# Patient Record
Sex: Male | Born: 1960 | Race: White | Hispanic: No | Smoking: Former smoker
Health system: Southern US, Academic
[De-identification: ages and names within clinical notes are randomized; demographics above are authoritative.]

## PROBLEM LIST (undated history)

## (undated) DIAGNOSIS — K219 Gastro-esophageal reflux disease without esophagitis: Secondary | ICD-10-CM

## (undated) DIAGNOSIS — F419 Anxiety disorder, unspecified: Secondary | ICD-10-CM

## (undated) DIAGNOSIS — E119 Type 2 diabetes mellitus without complications: Secondary | ICD-10-CM

## (undated) DIAGNOSIS — F329 Major depressive disorder, single episode, unspecified: Secondary | ICD-10-CM

## (undated) DIAGNOSIS — F32A Depression, unspecified: Secondary | ICD-10-CM

## (undated) DIAGNOSIS — M869 Osteomyelitis, unspecified: Secondary | ICD-10-CM

## (undated) DIAGNOSIS — G629 Polyneuropathy, unspecified: Secondary | ICD-10-CM

## (undated) HISTORY — PX: MANDIBLE SURGERY: SHX707

## (undated) HISTORY — PX: SHOULDER SURGERY: SHX246

---

## 1995-12-31 ENCOUNTER — Other Ambulatory Visit (HOSPITAL_COMMUNITY): Payer: Self-pay

## 2019-08-06 DIAGNOSIS — Z22322 Carrier or suspected carrier of Methicillin resistant Staphylococcus aureus: Secondary | ICD-10-CM

## 2019-08-06 HISTORY — DX: Carrier or suspected carrier of methicillin resistant Staphylococcus aureus: Z22.322

## 2019-08-11 ENCOUNTER — Inpatient Hospital Stay
Admission: EM | Admit: 2019-08-11 | Discharge: 2019-08-17 | DRG: 854 | Disposition: A | Discharge status: Home / Self Care | Payer: Medicare PPO | Attending: Internal Medicine | Admitting: Internal Medicine

## 2019-08-11 ENCOUNTER — Inpatient Hospital Stay (HOSPITAL_COMMUNITY): Payer: Medicare PPO | Admitting: Internal Medicine

## 2019-08-11 ENCOUNTER — Other Ambulatory Visit: Payer: Self-pay

## 2019-08-11 ENCOUNTER — Encounter (HOSPITAL_COMMUNITY): Payer: Self-pay | Admitting: Internal Medicine

## 2019-08-11 ENCOUNTER — Inpatient Hospital Stay (EMERGENCY_DEPARTMENT_HOSPITAL): Payer: Medicare PPO

## 2019-08-11 DIAGNOSIS — Z87891 Personal history of nicotine dependence: Secondary | ICD-10-CM

## 2019-08-11 DIAGNOSIS — L97429 Non-pressure chronic ulcer of left heel and midfoot with unspecified severity: Secondary | ICD-10-CM | POA: Diagnosis present

## 2019-08-11 DIAGNOSIS — E113293 Type 2 diabetes mellitus with mild nonproliferative diabetic retinopathy without macular edema, bilateral: Secondary | ICD-10-CM

## 2019-08-11 DIAGNOSIS — M86672 Other chronic osteomyelitis, left ankle and foot: Secondary | ICD-10-CM | POA: Diagnosis present

## 2019-08-11 DIAGNOSIS — H05012 Cellulitis of left orbit: Secondary | ICD-10-CM | POA: Diagnosis present

## 2019-08-11 DIAGNOSIS — N179 Acute kidney failure, unspecified: Secondary | ICD-10-CM | POA: Diagnosis present

## 2019-08-11 DIAGNOSIS — H40009 Preglaucoma, unspecified, unspecified eye: Secondary | ICD-10-CM

## 2019-08-11 DIAGNOSIS — L219 Seborrheic dermatitis, unspecified: Secondary | ICD-10-CM | POA: Diagnosis present

## 2019-08-11 DIAGNOSIS — L03213 Periorbital cellulitis: Secondary | ICD-10-CM | POA: Diagnosis present

## 2019-08-11 DIAGNOSIS — H2513 Age-related nuclear cataract, bilateral: Secondary | ICD-10-CM

## 2019-08-11 DIAGNOSIS — E46 Unspecified protein-calorie malnutrition: Secondary | ICD-10-CM | POA: Diagnosis present

## 2019-08-11 DIAGNOSIS — E1169 Type 2 diabetes mellitus with other specified complication: Secondary | ICD-10-CM | POA: Diagnosis present

## 2019-08-11 DIAGNOSIS — E11621 Type 2 diabetes mellitus with foot ulcer: Secondary | ICD-10-CM | POA: Diagnosis present

## 2019-08-11 DIAGNOSIS — E1142 Type 2 diabetes mellitus with diabetic polyneuropathy: Secondary | ICD-10-CM | POA: Diagnosis present

## 2019-08-11 DIAGNOSIS — T368X5A Adverse effect of other systemic antibiotics, initial encounter: Secondary | ICD-10-CM | POA: Diagnosis present

## 2019-08-11 DIAGNOSIS — J9811 Atelectasis: Secondary | ICD-10-CM

## 2019-08-11 DIAGNOSIS — R918 Other nonspecific abnormal finding of lung field: Secondary | ICD-10-CM

## 2019-08-11 DIAGNOSIS — Z01810 Encounter for preprocedural cardiovascular examination: Secondary | ICD-10-CM

## 2019-08-11 DIAGNOSIS — H40003 Preglaucoma, unspecified, bilateral: Secondary | ICD-10-CM | POA: Diagnosis present

## 2019-08-11 DIAGNOSIS — A4102 Sepsis due to Methicillin resistant Staphylococcus aureus: Principal | ICD-10-CM | POA: Diagnosis present

## 2019-08-11 DIAGNOSIS — R911 Solitary pulmonary nodule: Secondary | ICD-10-CM | POA: Diagnosis present

## 2019-08-11 DIAGNOSIS — Z79899 Other long term (current) drug therapy: Secondary | ICD-10-CM

## 2019-08-11 DIAGNOSIS — R9431 Abnormal electrocardiogram [ECG] [EKG]: Secondary | ICD-10-CM

## 2019-08-11 DIAGNOSIS — K5900 Constipation, unspecified: Secondary | ICD-10-CM | POA: Diagnosis not present

## 2019-08-11 DIAGNOSIS — M8580 Other specified disorders of bone density and structure, unspecified site: Secondary | ICD-10-CM | POA: Diagnosis present

## 2019-08-11 DIAGNOSIS — J330 Polyp of nasal cavity: Secondary | ICD-10-CM

## 2019-08-11 DIAGNOSIS — I330 Acute and subacute infective endocarditis: Secondary | ICD-10-CM

## 2019-08-11 DIAGNOSIS — D638 Anemia in other chronic diseases classified elsewhere: Secondary | ICD-10-CM | POA: Diagnosis present

## 2019-08-11 DIAGNOSIS — Z794 Long term (current) use of insulin: Secondary | ICD-10-CM

## 2019-08-11 DIAGNOSIS — D3141 Benign neoplasm of right ciliary body: Secondary | ICD-10-CM

## 2019-08-11 DIAGNOSIS — E1140 Type 2 diabetes mellitus with diabetic neuropathy, unspecified: Secondary | ICD-10-CM

## 2019-08-11 HISTORY — DX: Type 2 diabetes mellitus without complications (CMS HCC): E11.9

## 2019-08-11 LAB — CBC WITH DIFF
BASOPHIL #: 0.11 10*3/uL (ref <=0.20)
BASOPHIL %: 1 %
EOSINOPHIL #: 0.22 10*3/uL (ref <=0.50)
EOSINOPHIL %: 1 %
HCT: 37 % — ABNORMAL LOW (ref 38.9–52.0)
HGB: 12.5 g/dL — ABNORMAL LOW (ref 13.4–17.5)
IMMATURE GRANULOCYTE #: 0.11 10*3/uL — ABNORMAL HIGH (ref <0.10)
IMMATURE GRANULOCYTE %: 1 % (ref 0–1)
LYMPHOCYTE #: 1.4 10*3/uL (ref 1.00–4.80)
LYMPHOCYTE %: 8 %
MCH: 30 pg (ref 26.0–32.0)
MCHC: 33.8 g/dL (ref 31.0–35.5)
MCV: 88.7 fL (ref 78.0–100.0)
MONOCYTE #: 1.21 10*3/uL — ABNORMAL HIGH (ref 0.20–1.10)
MONOCYTE %: 7 %
MPV: 10.5 fL (ref 8.7–12.5)
NEUTROPHIL #: 13.92 10*3/uL — ABNORMAL HIGH (ref 1.50–7.70)
NEUTROPHIL %: 82 %
PLATELETS: 256 10*3/uL (ref 150–400)
RBC: 4.17 10*6/uL — ABNORMAL LOW (ref 4.50–6.10)
RDW-CV: 12.8 % (ref 11.5–15.5)
WBC: 17 10*3/uL — ABNORMAL HIGH (ref 3.7–11.0)

## 2019-08-11 LAB — HEPATIC FUNCTION PANEL
ALBUMIN: 2.5 g/dL — ABNORMAL LOW (ref 3.5–5.0)
ALKALINE PHOSPHATASE: 120 U/L — ABNORMAL HIGH (ref 45–115)
ALT (SGPT): 6 U/L (ref <55)
AST (SGOT): 16 U/L (ref 8–48)
BILIRUBIN DIRECT: 0.3 mg/dL — ABNORMAL HIGH (ref <0.3)
BILIRUBIN TOTAL: 0.7 mg/dL (ref 0.3–1.3)
PROTEIN TOTAL: 7.5 g/dL (ref 6.4–8.3)

## 2019-08-11 LAB — URINALYSIS, MACROSCOPIC
BILIRUBIN: NEGATIVE mg/dL
COLOR: NORMAL
GLUCOSE: NEGATIVE mg/dL
KETONES: NEGATIVE mg/dL
LEUKOCYTES: NEGATIVE WBCs/uL
NITRITE: NEGATIVE
PH: 5 (ref 5.0–8.0)
PROTEIN: >500 mg/dL — AB
SPECIFIC GRAVITY: 1.014 (ref 1.005–1.030)
UROBILINOGEN: NEGATIVE mg/dL

## 2019-08-11 LAB — VANCOMYCIN, RANDOM: VANCOMYCIN RANDOM: 7.3 ug/mL

## 2019-08-11 LAB — HIV1/HIV2 SCREEN, COMBINED ANTIGEN AND ANTIBODY: HIV SCREEN, COMBINED ANTIGEN & ANTIBODY: NEGATIVE

## 2019-08-11 LAB — STREPTOCOCCUS PNEUMONIAE ANTIGEN,URINE: S.PNEUMONIA ANTIGEN: NEGATIVE

## 2019-08-11 LAB — URINALYSIS, MICROSCOPIC
RBCS: 4 /HPF (ref <6.0)
WBCS: 1 /HPF (ref <4.0)

## 2019-08-11 LAB — HEPATITIS C ANTIBODY SCREEN WITH REFLEX TO HCV PCR: HCV ANTIBODY QUALITATIVE: NEGATIVE

## 2019-08-11 LAB — BASIC METABOLIC PANEL
ANION GAP: 13 mmol/L (ref 4–13)
BUN/CREA RATIO: 16 (ref 6–22)
BUN: 23 mg/dL (ref 8–25)
CALCIUM: 9.3 mg/dL (ref 8.5–10.2)
CHLORIDE: 105 mmol/L (ref 96–111)
CO2 TOTAL: 20 mmol/L — ABNORMAL LOW (ref 22–32)
CREATININE: 1.43 mg/dL — ABNORMAL HIGH (ref 0.62–1.27)
ESTIMATED GFR: 53 mL/min/{1.73_m2} — ABNORMAL LOW (ref >60)
GLUCOSE: 78 mg/dL (ref 65–139)
POTASSIUM: 3.8 mmol/L (ref 3.5–5.1)
SODIUM: 138 mmol/L (ref 136–145)

## 2019-08-11 LAB — VENOUS BLOOD GAS/LACTATE
%FIO2 (VENOUS): 21 %
BASE DEFICIT: 0.2 mmol/L (ref ?–3.0)
BICARBONATE (VENOUS): 23.8 mmol/L (ref 22.0–26.0)
LACTATE: 1.1 mmol/L (ref 0.0–1.3)
PCO2 (VENOUS): 41 mmHg (ref 41.00–51.00)
PH (VENOUS): 7.39 (ref 7.31–7.41)
PO2 (VENOUS): 32 mmHg — ABNORMAL LOW (ref 35.0–50.0)

## 2019-08-11 LAB — LEGIONELLA URINE ANTIGEN: LEGIONELLA ANTIGEN: NEGATIVE

## 2019-08-11 LAB — CREATININE URINE, RANDOM: CREATININE RANDOM URINE: 82 mg/dL

## 2019-08-11 LAB — SODIUM, RANDOM URINE: SODIUM RANDOM URINE: 65 mmol/L

## 2019-08-11 LAB — C-REACTIVE PROTEIN(CRP),INFLAMMATION: CRP INFLAMMATION: 210.2 mg/L — ABNORMAL HIGH (ref <=8.0)

## 2019-08-11 LAB — MAGNESIUM: MAGNESIUM: 1.8 mg/dL (ref 1.6–2.6)

## 2019-08-11 LAB — POC BLOOD GLUCOSE (RESULTS): GLUCOSE, POC: 217 mg/dL — ABNORMAL HIGH (ref 70–105)

## 2019-08-11 MED ORDER — HEPARIN (PORCINE) 5,000 UNIT/ML INJECTION SOLUTION
5000.0000 [IU] | Freq: Three times a day (TID) | INTRAMUSCULAR | Status: DC
Start: 2019-08-11 — End: 2019-08-17
  Administered 2019-08-11 – 2019-08-17 (×18): 5000 [IU] via SUBCUTANEOUS
  Filled 2019-08-11 (×18): qty 1

## 2019-08-11 MED ORDER — SODIUM CHLORIDE 0.9 % INTRAVENOUS PIGGYBACK
2.00 g | INJECTION | Freq: Two times a day (BID) | INTRAVENOUS | Status: DC
Start: 2019-08-12 — End: 2019-08-17
  Administered 2019-08-12 (×2): 0 g via INTRAVENOUS
  Administered 2019-08-12 (×2): 2 g via INTRAVENOUS
  Administered 2019-08-13: 0 g via INTRAVENOUS
  Administered 2019-08-13 – 2019-08-14 (×2): 2 g via INTRAVENOUS
  Administered 2019-08-14 (×2): 0 g via INTRAVENOUS
  Administered 2019-08-14 – 2019-08-15 (×3): 2 g via INTRAVENOUS
  Administered 2019-08-15 – 2019-08-16 (×3): 0 g via INTRAVENOUS
  Administered 2019-08-16 (×2): 2 g via INTRAVENOUS
  Administered 2019-08-16: 03:00:00 0 g via INTRAVENOUS
  Administered 2019-08-17: 01:00:00 2 g via INTRAVENOUS
  Administered 2019-08-17: 13:00:00
  Administered 2019-08-17: 04:00:00 0 g via INTRAVENOUS
  Filled 2019-08-11 (×5): qty 12.5
  Filled 2019-08-11: qty 100
  Filled 2019-08-11 (×13): qty 12.5

## 2019-08-11 MED ORDER — MORPHINE 4 MG/ML INTRAVENOUS SOLUTION
4.0000 mg | INTRAVENOUS | Status: AC
Start: 2019-08-11 — End: 2019-08-11
  Administered 2019-08-11: 4 mg via INTRAVENOUS

## 2019-08-11 MED ORDER — METRONIDAZOLE 500 MG/100 ML IN SODIUM CHLOR(ISO) INTRAVENOUS PIGGYBACK
500.0000 mg | INJECTION | Freq: Three times a day (TID) | INTRAVENOUS | Status: DC
Start: 2019-08-11 — End: 2019-08-12
  Administered 2019-08-11: 500 mg via INTRAVENOUS
  Administered 2019-08-11 – 2019-08-12 (×2): 0 mg via INTRAVENOUS
  Administered 2019-08-12: 500 mg via INTRAVENOUS
  Filled 2019-08-11 (×3): qty 100

## 2019-08-11 MED ORDER — GABAPENTIN 400 MG CAPSULE
400.00 mg | ORAL_CAPSULE | Freq: Three times a day (TID) | ORAL | Status: DC
Start: 2019-08-12 — End: 2019-08-17
  Administered 2019-08-12 – 2019-08-17 (×16): 400 mg via ORAL
  Filled 2019-08-11 (×16): qty 1

## 2019-08-11 MED ORDER — VANCOMYCIN 10 GRAM INTRAVENOUS SOLUTION
20.00 mg/kg | INTRAVENOUS | Status: DC
Start: 2019-08-11 — End: 2019-08-13
  Administered 2019-08-11: 1750 mg via INTRAVENOUS
  Administered 2019-08-12 (×2): 0 mg via INTRAVENOUS
  Administered 2019-08-12 – 2019-08-13 (×3): 1750 mg via INTRAVENOUS
  Administered 2019-08-13: 07:00:00 via INTRAVENOUS
  Administered 2019-08-13: 0 mg via INTRAVENOUS
  Filled 2019-08-11 (×3): qty 17.5

## 2019-08-11 MED ORDER — ACETAMINOPHEN 325 MG TABLET
975.0000 mg | ORAL_TABLET | ORAL | Status: AC
Start: 2019-08-11 — End: 2019-08-11
  Administered 2019-08-11: 975 mg via ORAL
  Filled 2019-08-11: qty 3

## 2019-08-11 MED ORDER — SODIUM CHLORIDE 0.9 % INTRAVENOUS PIGGYBACK
2.00 g | INJECTION | INTRAVENOUS | Status: AC
Start: 2019-08-11 — End: 2019-08-11
  Administered 2019-08-11: 2 g via INTRAVENOUS
  Administered 2019-08-11: 0 g via INTRAVENOUS
  Filled 2019-08-11 (×3): qty 12.5

## 2019-08-11 MED ORDER — INSULIN GLARGINE (U-100) 100 UNIT/ML SUBCUTANEOUS SOLUTION
5.0000 [IU] | Freq: Every evening | SUBCUTANEOUS | Status: DC
Start: 2019-08-11 — End: 2019-08-11

## 2019-08-11 MED ORDER — SODIUM CHLORIDE 0.9 % (FLUSH) INJECTION SYRINGE
2.0000 mL | INJECTION | Freq: Three times a day (TID) | INTRAMUSCULAR | Status: DC
Start: 2019-08-11 — End: 2019-08-17
  Administered 2019-08-11 – 2019-08-12 (×2): 2 mL
  Administered 2019-08-12: 14:00:00 0 mL
  Administered 2019-08-12 – 2019-08-13 (×3): 2 mL
  Administered 2019-08-13: 22:00:00 4 mL
  Administered 2019-08-14: 0 mL
  Administered 2019-08-14: 20:00:00 6 mL
  Administered 2019-08-14: 2 mL
  Administered 2019-08-15 (×3): 0 mL
  Administered 2019-08-16 (×2): 2 mL
  Administered 2019-08-16: 0 mL
  Administered 2019-08-17 (×2): 2 mL

## 2019-08-11 MED ORDER — INSULIN LISPRO 100 UNIT/ML SUB-Q SSIP
0.00 [IU] | INJECTION | Freq: Four times a day (QID) | SUBCUTANEOUS | Status: DC | PRN
Start: 2019-08-11 — End: 2019-08-17
  Administered 2019-08-11: 4 [IU] via SUBCUTANEOUS
  Administered 2019-08-12: 19:00:00 6 [IU] via SUBCUTANEOUS
  Administered 2019-08-12 (×2): 0 [IU] via SUBCUTANEOUS
  Administered 2019-08-13: 12:00:00 4 [IU] via SUBCUTANEOUS
  Administered 2019-08-13 (×2): 2 [IU] via SUBCUTANEOUS
  Administered 2019-08-14: 21:00:00 4 [IU] via SUBCUTANEOUS
  Administered 2019-08-14 (×2): 2 [IU] via SUBCUTANEOUS
  Administered 2019-08-14: 18:00:00 4 [IU] via SUBCUTANEOUS
  Administered 2019-08-15: 0 [IU] via SUBCUTANEOUS
  Administered 2019-08-15: 13:00:00 4 [IU] via SUBCUTANEOUS
  Administered 2019-08-15: 2 [IU] via SUBCUTANEOUS
  Administered 2019-08-15 – 2019-08-16 (×4): 4 [IU] via SUBCUTANEOUS
  Administered 2019-08-16 – 2019-08-17 (×3): 2 [IU] via SUBCUTANEOUS
  Administered 2019-08-17: 4 [IU] via SUBCUTANEOUS
  Filled 2019-08-11: qty 3

## 2019-08-11 MED ORDER — LACTATED RINGERS IV BOLUS
1000.0000 mL | INJECTION | Status: AC
Start: 2019-08-11 — End: 2019-08-11
  Administered 2019-08-11: 19:00:00 1000 mL via INTRAVENOUS
  Administered 2019-08-11: 0 mL via INTRAVENOUS

## 2019-08-11 MED ORDER — SODIUM CHLORIDE 0.9 % (FLUSH) INJECTION SYRINGE
2.0000 mL | INJECTION | INTRAMUSCULAR | Status: DC | PRN
Start: 2019-08-11 — End: 2019-08-17
  Administered 2019-08-13 – 2019-08-15 (×4): 2 mL

## 2019-08-11 MED ORDER — ACETAMINOPHEN 325 MG TABLET
650.0000 mg | ORAL_TABLET | ORAL | Status: DC | PRN
Start: 2019-08-12 — End: 2019-08-17
  Administered 2019-08-13 – 2019-08-16 (×10): 650 mg via ORAL
  Filled 2019-08-11 (×10): qty 2

## 2019-08-11 MED ORDER — OXYCODONE 5 MG TABLET
10.0000 mg | ORAL_TABLET | ORAL | Status: DC | PRN
Start: 2019-08-11 — End: 2019-08-14
  Administered 2019-08-11 – 2019-08-14 (×12): 10 mg via ORAL
  Filled 2019-08-11 (×12): qty 2

## 2019-08-11 MED ORDER — VANCOMYCIN IV - PHARMACIST TO DOSE PER PROTOCOL - NO EXCLUSION CRITERIA
Freq: Every day | Status: DC | PRN
Start: 2019-08-11 — End: 2019-08-16
  Administered 2019-08-17: 06:00:00 0

## 2019-08-11 MED ORDER — MORPHINE 4 MG/ML INTRAVENOUS SOLUTION
4.00 mg | INTRAVENOUS | Status: DC
Start: 2019-08-11 — End: 2019-08-11
  Filled 2019-08-11: qty 1

## 2019-08-11 MED ORDER — SODIUM CHLORIDE 0.9 % INTRAVENOUS SOLUTION
INTRAVENOUS | Status: AC
Start: 2019-08-11 — End: 2019-08-12
  Administered 2019-08-12 (×2): via INTRAVENOUS

## 2019-08-11 MED ORDER — OXYCODONE 5 MG TABLET
5.0000 mg | ORAL_TABLET | ORAL | Status: DC | PRN
Start: 2019-08-11 — End: 2019-08-14

## 2019-08-11 NOTE — ED Nurses Note (Signed)
Optho at bedside.

## 2019-08-11 NOTE — Pharmacy (Addendum)
[facility] / [facility]  Therapeutic Drug Monitoring: Vancomycin  08/11/2019      Patient name: [name] [name], [name] [name]  Date of Birth:  Apr 27, 1961    Actual Weight:  Weight: 86.2 kg (190 lb) (08/11/19 1748)     BMI:  BMI (Calculated): 26.18 (08/11/19 1748)    Date RPh Current regimen (including mg/kg) Indication Target Levels (mcg/mL) SCr (mg/dL) CrCl* (mL/min) Measured level (mcg/mL) Plan (including when levels are due) Comments   12/2 VK        Received at outside hospital:   Vanc 1500 mg on 12/1 @ 1500 CTX 2g on 12/1@1730  and 12/2 @1200                                                                               *Creatinine clearance is estimated by using the Cockcroft-Gault equation for adult patients and the Carol Ada for pediatric patients.    The decision to discontinue vancomycin therapy will be determined by the primary service.  Please contact the pharmacist with any questions regarding this patient's medication regimen.

## 2019-08-11 NOTE — ED Nurses Note (Signed)
Blood cultures collected and sent to laboratory. Additional PIV placed at this time. Pt provided w/ sprite zero and boxed lunch. Call bell within reach. OBS 6N clerk called and left RN call back number at this time for report.

## 2019-08-11 NOTE — Incoming ED Transfer Note (Addendum)
ED TRANSFER NOTE    Narrative: 58 y.o. male scratched himself with his fingernail. Anterior face  and Left face orbital area became edematous, red and painful.  Pt left eye has pain and is swollen shut.  Outside facility phys can not open to examine eye.  Ct viewed by [facility] ophthalmologist and deemed emergent to be seen by his service, not avail at outside facility.    Pt is MRSA+.    Type of Transfer: GENERAL    INCOMING GENERAL PT INFORMATION    Transferring Facility: [facility] ETA: 1600    Mode of Arrival: Ambulance    Reason for transfer: Other(ophthalmology)     Arrived at Transferring Facility:Today     Interventions: Vanc, ceftriaxone, insulin    Response to Interventions: WBC coming down.    Anticoagulation/Antiplatet: No     Pertinent Labs: WBC 16.5, glucose 336 (DMII)  Lactic acid 2.4     Reported Vital Signs:     HR 96      BP 160/84    Temp 100.5    O2 Sat 98 ra    RR 16    Weight 86.1 kg     Images Obtained Prior to Transfer: Yes    If Yes, What Images?  CT Head    W/o contrast due to DM and AKI  Mode of Receiving Images?  IMAGES AVAIL MODE: Image Grid  Key Findings: proptosis left globe    Comments:  Dr Vicenta Aly connected with Dr Gavin Pound, Ophthalmology. Whom stated pt needs to be emergently seen by Ophth.     Recommendations to transferring facility: ED-ED  Recommended if possible using proparacaine and trying to examine eye.    Anticipated immediate needs on arrival (please provide rationale):  ophthalmology              **RUBY ONLY - Go To Navigator and choose the Type of Activation to print to MEDCOM**    Randolm Idol, RN, 08/11/2019 12:06

## 2019-08-11 NOTE — ED Nurses Note (Signed)
Transporter taking pt to assigned room at this time.

## 2019-08-11 NOTE — ED Nurses Note (Signed)
Verbal report given to receiving RN.

## 2019-08-11 NOTE — Consults (Addendum)
[facility]  Ophthalmology Initial Consult Note      [name] [name], [name] [name], 58 y.o. male  Date of Service:  08/11/2019      Requesting physician: Georgetta Haber, MD    Information Obtained from: patient  Chief Complaint:  L eye pain    HPI/Discussion: States that about 5 days ago patient hit his face with his fingernail, making a small incision in his left side of face. Patient then started noticing swelling, erythema, and pain around his left eye that has gotten progressively worse, having abscess draining purulent pus. Patient was seen at [facility] and states that he was there for last 3 days. Patient was given IV Vanc and rocephin per transfer note for concerning MRSA. Has pain with EOM in the left eye but no sensitivity to light. Denies floaters or fol. Denies drastic change in vision. Denies prior ocular hx other than refractive error, had LASIK surgery OU, otherwise family hx unremarkable    Past Ocular Hx:  LASIK OU  Ocular Meds:  denies  Family Ocular Hx: denies    No past medical history on file.  Past Medical History was reviewed and is negative for other ocular disease    Past Surgical History was reviewed and is negative for intraocular surgery        Prior to Admission Meds:  Medications Prior to Admission     None          Inpatient Meds:    .  acetaminophen (TYLENOL) tablet, 975 mg, Oral, Now    .  LR bolus infusion 1,000 mL, 1,000 mL, Intravenous, Now        No Known Allergies  Social History     Tobacco Use   . Smoking status: Not on file   Substance Use Topics   . Alcohol use: Not on file       ROS: Other than ROS in the HPI, all other systems were negative    Neuro:  Oriented to person, place, and time:  Yes  Psychiatric:  Mood and Affect Appropriate:  Yes    Exam:  Temperature: (!) 38.6 C (101.4 F)  Heart Rate: (!) 102  BP (Non-Invasive): (!) 148/89  Respiratory Rate: 18  SpO2: 97 %    Visual Acuity:   Near without glasses  ph  Color plates (via phone app)    OD 20/30-  20/30-  14/14      OS 20/100  20/30+1  12/14          OD OS   Confr Vis Fields WNL WNL   EOM (Primary) WNL WNL   Conjunctiva - Palpebral    WNL 1+ erythema   Conjunctiva - Bulbar WNL WNL no injections or chemosis   Adnexa  WNL 2+ swelling and erythema with fluctuant mass along the medial to superonasal orbital rim extending superior to the nasal bridge. Actively draining purulent discharge in two different locations, medial to medial canthus and superior left brow.    Pupils  WNL WNL   Reaction, Direct WNL 5--> 19mm WNL 5--> 47mm                  Consensual WNL WNL                  RAPD No No   Cornea  WNL WNL   Anterior Chamber WNL, Nevus on inferonasal iris WNL   Lens:  2+ NS 2+ NS   IOP by Tonopen 16 21  Optic Disc - C:D Ratio 0.9 0.9                      Appearance  excavated without heme or edema, PPA temporally Excavated without heme or edema, PPA temporally, superior and inferior thinning   Post Seg:  Retina                     Vessels WNL WNL                   Vitreous  WNL WNL                   Macula WNL WNL                   Periphery DBH temporally, no nv DBH inferiorly, no nv       Pupil Dilation: Dilation Medications:   2.5% phenylephrine, 1% mydriacyl       Labs/imaging:   CT without contrast from the outside facility     Severe soft tissue swelling and thickening involving the midline to left paramedian frontal scalp, left the preseptal periorbital region and extending into the bridge of the nose. Consistent with edema and/or cellulitis    No formed fluid collection or abscess is seen.         A/P:   58 y.o. who presented with findings most consistent with extensive preseptal cellulitis with abscess drainage. 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 Hollice Espy    Appreciate the consultation.     Levonne Spiller, MD 08/11/2019, 18:52      Patient's eyes were dilated at 18:30 PM.  If there are any concerns or questions about dilation, please page the ophthalmology resident on call    Late entry for 08/11/19.     I saw and examined the patient.  I reviewed the resident's note.  I agree with the findings and plan of care as documented in the resident's note.  Any exceptions/additions are edited/noted.  Delia Chimes, MD 08/12/2019, 07:19

## 2019-08-11 NOTE — ED Nurses Note (Signed)
Pt sterile swab collected and given to opthalmology to walk to laboratory. Pt made aware that blood cultures were needed. Pt agreeable to plan.

## 2019-08-11 NOTE — ED Nurses Note (Signed)
Received report from previous RN at this time.

## 2019-08-11 NOTE — Nurses Notes (Signed)
Paged service. Did you want pt in isolation for MRSA?

## 2019-08-11 NOTE — ED Attending Note (Signed)
Patient was seen, evaluated, treated, and dispositioned by myself primarily.  There was no resident or mid-level provider directly involved in the patient's care.  Please see ED primary note for documentation of the encounter.

## 2019-08-11 NOTE — H&P (Signed)
[facility]  General Medicine  Admission H&P    Date of Service:  08/11/2019  [name] [name], 58 y.o. male  Date of Admission:  08/11/2019  Date of Birth:  [birth year]  PCP: [facility]    LAY CAREGIVER   Appointed Lay Caregiver?: I Decline     Information Obtained from: patient and history reviewed via medical record  Chief Complaint:  periorbital abscess    HPI: [name] [name] is a 58 y.o., White male with a PMH of insulin dependent type 2 DM who presents form outside facility for concerns of preseptal cellulitis and abscess of left eye. Patient states he scratched himself in the area about 7 days ago while itching himself leaving a cut. This became swollen, red, and painful causing him to present for medical care 4-5 days ago at an outside facility. He was given clindamycin PO for this and sent home. His eye/face continued to worsen so he presented back to the outside facility and was admitted for IV antibiotics given severity and lack of improvement. CT was performed at outside facility concerning for edema/cellulitis of the area and he was sent to [facility] for ophthalmology consultation. He states the pain is dull and throbbing and at times is an 8/10 in intensity. He has had associated chills but denies fevers. He states he can open his eye slightly but has difficulty seeing out of it related to the swelling but denies other vision changes. He denies past events similar but does have DM foot wounds that he follows with wound care for as an outpatient. He also endorses a headache related to bedside I&D performed today but has no other concerns. All other ROS below.    PAST MEDICAL:    Past Medical History:   Diagnosis Date   . Diabetes mellitus, type 2 (CMS HCC)         Jaw Surgery     Medications Prior to Admission     Prescriptions    gabapentin (NEURONTIN) 400 mg Oral Capsule    Take 1,200 mg by mouth Three times a day    insulin glargine (LANTUS) 100 unit/mL Subcutaneous injection (vial)    20 Units  by Subcutaneous route Every night      Patient states was taking Humalog at home but pharmacy states he is taking Novolog     No Known Allergies      Family History  No family history per patient    Social History  Social History     Tobacco Use   . Smoking status: Former Smoker     Types: Cigars   . Smokeless tobacco: Never Used   Substance Use Topics   . Alcohol use: Yes     Comment: occasionally        ROS:   Constitutional: positive for chills, negative for fevers  Eyes: positive for visual disturbance as he cannot really open his left eye very well from area of concern  Ears, nose, mouth, throat, and face: negative for nasal congestion and sore throat  Respiratory: negative for cough or shortness of breath  Cardiovascular: negative for chest pain and irregular heart beats  Gastrointestinal: negative for nausea, vomiting, melena, diarrhea and constipation  Genitourinary:negative for dysuria and hematuria  Integument/breast: negative for rash  Hematologic/lymphatic: negative for lymphadenopathy  Musculoskeletal:negative for myalgias and arthralgias. has diabetic neuropathy  Neurological: has a headache related to the procedure, no more dizziness than his baseline  Behavioral/Psych: negative for depression  Endocrine: positive for  diabetes  Allergic/Immunologic: NKDA      Examination:  Temperature: 37.2 C (98.9 F) Heart Rate: 95 BP (Non-Invasive): 104/71   Respiratory Rate: 18 SpO2: 97 % Pain Score (Numeric, Faces): 7   Constitutional: appears in good health and no distress  Eyes: right eye pupil is equal and round, left eye is very swollen, erythematous and difficult to open, see photo below  ENT: Mouth mucous membranes moist.   Neck: trachea midline  Respiratory: Clear to auscultation bilaterally.   Cardiovascular: regular rate and rhythm, S1, S2 normal, no murmur, click, rub or gallop  Gastrointestinal: Soft, non-tender, Bowel sounds normal  Genitourinary: Deferred  Musculoskeletal: Head atraumatic and  normocephalic  Integumentary:  Skin warm and dry and skin lesion over left eye/face (see photo below) diabetic foot wounds on BL heels (see photos below)  Neurologic: Alert and oriented x3, sensation to light touch intact bilaterally, muscle strength 5/5 in flexion and extension of elbow but 4/5 in hand grip and plantar/dorsiflexion of bilateral hands and feet, respectively which is his baseline (diabetic neuropathy)  Psychiatric: Affect Normal  Glasgow: Eye opening: 4 spontaneous, Verbal resonse:  5 oriented, Best motor response:  6 obeys commands                Labs:    Lab Results Today:    Results for orders placed or performed during the hospital encounter of 08/11/19 (from the past 24 hour(s))   HEPATITIS C ANTIBODY SCREEN WITH REFLEX TO HCV PCR   Result Value Ref Range    HCV ANTIBODY QUALITATIVE Negative Negative   HIV1/HIV2 SCREEN, COMBINED ANTIGEN AND ANTIBODY   Result Value Ref Range    HIV SCREEN, COMBINED ANTIGEN & ANTIBODY Negative Negative   BASIC METABOLIC PANEL   Result Value Ref Range    SODIUM 138 136 - 145 mmol/L    POTASSIUM 3.8 3.5 - 5.1 mmol/L    CHLORIDE 105 96 - 111 mmol/L    CO2 TOTAL 20 (L) 22 - 32 mmol/L    ANION GAP 13 4 - 13 mmol/L    CALCIUM 9.3 8.5 - 10.2 mg/dL    GLUCOSE 78 65 - 139 mg/dL    BUN 23 8 - 25 mg/dL    CREATININE 1.43 (H) 0.62 - 1.27 mg/dL    BUN/CREA RATIO 16 6 - 22    ESTIMATED GFR 53 (L) >60 mL/min/1.29m^2   C-REACTIVE PROTEIN(CRP)   Result Value Ref Range    CRP INFLAMMATION 210.2 (H) <=8.0 mg/L   HEPATIC FUNCTION PANEL   Result Value Ref Range    ALBUMIN 2.5 (L) 3.5 - 5.0 g/dL    ALKALINE PHOSPHATASE 120 (H) 45 - 115 U/L    ALT (SGPT) 6 <55 U/L    AST (SGOT) 16 8 - 48 U/L    BILIRUBIN TOTAL 0.7 0.3 - 1.3 mg/dL    BILIRUBIN DIRECT 0.3 (H) <0.3 mg/dL    PROTEIN TOTAL 7.5 6.4 - 8.3 g/dL   MAGNESIUM   Result Value Ref Range    MAGNESIUM 1.8 1.6 - 2.6 mg/dL   CBC WITH DIFF   Result Value Ref Range    WBC 17.0 (H) 3.7 - 11.0 x10^3/uL    RBC 4.17 (L) 4.50 - 6.10 x10^6/uL     HGB 12.5 (L) 13.4 - 17.5 g/dL    HCT 37.0 (L) 38.9 - 52.0 %    MCV 88.7 78.0 - 100.0 fL    MCH 30.0 26.0 - 32.0 pg    MCHC  33.8 31.0 - 35.5 g/dL    RDW-CV 12.8 11.5 - 15.5 %    PLATELETS 256 150 - 400 x10^3/uL    MPV 10.5 8.7 - 12.5 fL    NEUTROPHIL % 82 %    LYMPHOCYTE % 8 %    MONOCYTE % 7 %    EOSINOPHIL % 1 %    BASOPHIL % 1 %    NEUTROPHIL # 13.92 (H) 1.50 - 7.70 x10^3/uL    LYMPHOCYTE # 1.40 1.00 - 4.80 x10^3/uL    MONOCYTE # 1.21 (H) 0.20 - 1.10 x10^3/uL    EOSINOPHIL # 0.22 <=0.50 x10^3/uL    BASOPHIL # 0.11 <=0.20 x10^3/uL    IMMATURE GRANULOCYTE % 1 0 - 1 %    IMMATURE GRANULOCYTE # 0.11 (H) <0.10 x10^3/uL   VENOUS BLOOD GAS/LACTATE   Result Value Ref Range    %FIO2 (VENOUS) 21.0 %    PH (VENOUS) 7.39 7.31 - 7.41    PCO2 (VENOUS) 41.00 41.00 - 51.00 mm/Hg    PO2 (VENOUS) 32.0 (L) 35.0 - 50.0 mm/Hg    BASE DEFICIT 0.2 -3.0 - 3.0 mmol/L    BICARBONATE (VENOUS) 23.8 22.0 - 26.0 mmol/L    LACTATE 1.1 0.0 - 1.3 mmol/L   VANCOMYCIN, RANDOM   Result Value Ref Range    VANCOMYCIN RANDOM 7.3   ug/mL    DOSE DATE 08/11/2019     DOSE TIME  6:00 AM    STERILE SITE CULTURE AND GRAM STAIN, AEROBIC    Specimen: Abscess; Other   Result Value Ref Range    GRAM STAIN 4+ Many PMNs (A)     GRAM STAIN 3+ Several Gram Positive Cocci (A)    SODIUM, RANDOM URINE   Result Value Ref Range    SODIUM RANDOM URINE 65 No Reference Range Established mmol/L   CREATININE URINE, RANDOM   Result Value Ref Range    CREATININE RANDOM URINE 82 No Reference Range Established mg/dL   URINALYSIS, MACROSCOPIC   Result Value Ref Range    SPECIFIC GRAVITY 1.014 1.005 - 1.030    GLUCOSE Negative Negative mg/dL    PROTEIN > 500 (A) Negative mg/dL    BILIRUBIN Negative Negative mg/dL    UROBILINOGEN Negative Negative mg/dL    PH 5.0 5.0 - 8.0    BLOOD Small (A) Negative mg/dL    KETONES Negative Negative mg/dL    NITRITE Negative Negative    LEUKOCYTES Negative Negative WBCs/uL    APPEARANCE Clear Clear    COLOR Normal (Yellow) Normal (Yellow)      URINALYSIS, MICROSCOPIC   Result Value Ref Range    WBCS 1.0 <4.0 /hpf    RBCS 4.0 <6.0 /hpf    BACTERIA Occasional or less Occasional or less /hpf    MUCOUS Light Light /lpf   POC BLOOD GLUCOSE (RESULTS)   Result Value Ref Range    GLUCOSE, POC 217 (H) 70 - 105 mg/dl       Imaging Studies:   CXR pending    CT Facial bones performed at outside facility    DNR Status:  Full Code    Assessment/Plan:   [name] [name] is a 58 year old male with a PMH of insulin dependent type 2 DM who presents as a transfer from and outside facility for periorbital abscess requiring drainage and IV antibiotics. Patient is febrile and tachycardic but not hypotensive and is admitted to Medicine 5 as floor status.  Active Hospital Problems    Diagnosis   .  Abscess of left periorbital region   Leukocytosis with Left Shift, Hyperthermia  Preseptal Cellulitis with Abscess growing MRSA at outside facility  -Tachycardic but not hypotensive  -CRP 210, will monitor tomorrow for trend  -Vancomycin, Cefepime, Flagyl (given possible concomitant DM foot infection)   -Was on vancomycin and ceftriaxone at outside facility   -Got 1L in ED and now on 87mL/hour NS  -Blood cultures, UA, CXR sent  -Cultures from outside facility was clindamycin resistant and vancomycin sensitive  -Ophthalmology performed I&D at bedside and are following (appreciate assistance)   -Recommending CT with contrast, will assess for need in AM   -Ophthalmology states EOMI and not concerning for septal involvement at this  time.   -Further evaluation for glaucoma and NPDR as outpatient    AKI   -No known CKD history but baseline creatinine unknown, was 1.9 at one point during admission at outside facility  -Urine electrolytes pending for workup  -Got 1L IVF in ED  -Will continue to monitor     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 and DM Foot Wound  -Hemoglobin A1c pending  -Holding home regimen (Lantus 20 units nightly and unclear short acting insulin  regimen)  -Starting SSI with QID POC checks  -Starting gabapentin 400mg  TID (on 1200 TID at home) given concomitant pain management during admission  -Will adjust as necessary  -Wound care consulted  -Vancomycin, cefepime, and flagyl for preseptal cellulitis and abscess as well as DM foot infection  -Likely DM education consult in AM      DVT/PE Prophylaxis: Heparin    Leanor Kail, MD      I saw and examined the patient.  I reviewed the resident's note.  I agree with the findings and plan of care as documented in the resident's note.  Any exceptions/additions are edited/noted.    Shane Crutch., MD

## 2019-08-11 NOTE — Progress Notes (Signed)
Sign out given.  Patient will be transferred/admitted by Medicine 5 team.  Please direct any question to the above team    [name] Ballengee Zia Wilba Mutz, MD  [facility]

## 2019-08-11 NOTE — Procedures (Signed)
Patient was seen at the bedside and laid supine. Left eye was prepped in the usual sterile fashion.     The area of the abscess was prepped in sterile manner. Two different areas of drainage were observed: centrally in the supraglabellar area and in the superior left medial canthus with intervening palpable fluctuance. Thick purulence was encountered and this was completely drained with manual compression. This was sent for culture analysis. Loculations were broken up inside the abscess cavity with cotton tip applicators and Westcott scissors. Copious irrigation was then used to clean the abscess cavity. The wound was then packed with iodofom gauze soaked in betadine. The wound was covered with sterile dry gauze dressing. Patient tolerated procedure well. There were no complications.     Teaching physician was present and supervised the entire procedure.  Delia Chimes, MD 08/12/2019, 09:58

## 2019-08-11 NOTE — ED Nurses Note (Signed)
Pt medicated per MAR for pain.

## 2019-08-11 NOTE — ED Provider Notes (Signed)
[name] - Emergency Department  Attending Primary Provider Note    Name: [name] [name]  Age and Gender: 58 y.o. male  Date of Birth: 09/19/1960  Date of Service: 08/11/2019   MRN: [id number]  PCP: [name] [name]    Chief Complaint   Patient presents with   . Eye Swelling     left orbital cellulitis. scratched eye a couple days ago, mrsa, abscess and drainage. allergies penicllin and tramadol. 1572m vancomycin, 2g Rocephin temp 100.553fhad some tylenol. [facility]. diabetic hx.        HPI:  Arrival: The patient arrived by ambulance and is alone  History Limitations: none    [name] a [age].o. male presenting with eye swelling, infection.    . Patient reports that he has had drainage and redness/swelling over his left eye.  . [name] [name] states that he scratched an area that appeared to be a pimple and since then his swelling and redness has progressed.  . [name] Kitchenis swelling discussed so bad that he cannot see out of that eye secondary to lid swelling and difficulty opening his eyelid.  . Patient has had fevers.  . He was seen initially at [name] very sudden per the stay.  Transferred here for ophthalmology evaluation.  . [name] [name] was given 150076mf vanc, 2 g of Rocephin prior to transfer.    ROS:  Constitutional:  +fever, chills, weakness.  Skin: No rash or diaphoresis.  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  HENT: No headaches, or congestion  Eyes: No photophobia.  + decreased vision from left eye, eyelid swelling, drainage from wound over left eye   Cardio: No chest pain, palpitations or leg swelling   Respiratory: No cough, wheezing or shortness of breath  GI:  No nausea, vomiting or stool changes  GU:  No dysuria, hematuria, or increased frequency  MSK: No muscle aches, joint or back pain  Neuro: No seizures, LOC, numbness, tingling, or focal weakness  Psychiatric: No depression, SI  or substance use  All other systems reviewed and are negative.      Below pertinent information reviewed with patient and/or EMR:  Past Medical History:   Diagnosis Date   . Diabetes mellitus, type 2 (CMS HCC)      Medications Prior to Admission     Prescriptions    gabapentin (NEURONTIN) 400 mg Oral Capsule    Take 1,200 mg by mouth Three times a day    insulin glargine (LANTUS) 100 unit/mL Subcutaneous injection (vial)    20 Units by Subcutaneous route Every night         No Known Allergies  Past Surgical History:   Procedure Laterality Date   . MANDIBLE SURGERY       Family Medical History:     None        Social History     Tobacco Use   . Smoking status: Former Smoker     Types: Cigars   . Smokeless tobacco: Never Used   Substance Use Topics   . Alcohol use: Yes     Comment: occasionally   . Drug use: Never       Objective:  ED Triage Vitals [08/11/19 1748]   BP (Non-Invasive) (!) 148/89   Heart Rate (!) 102   Respiratory Rate 18   Temperature (!) 38.6 C (101.4 F)   SpO2 97 %  Weight 86.2 kg (190 lb)   Height 1.816 m (5' 11.5")     Physical Exam  Vitals signs and nursing note reviewed.   Constitutional:       Appearance: He is obese. He is not diaphoretic.   HENT:      Head: Normocephalic and atraumatic.      Nose: Nose normal.      Mouth/Throat:      Mouth: Mucous membranes are moist.      Pharynx: Oropharynx is clear.   Eyes:      General: No scleral icterus.        Left eye: Discharge (Periorbital discharge) present.     Extraocular Movements: Extraocular movements intact.      Conjunctiva/sclera: Conjunctivae normal.      Pupils: Pupils are equal, round, and reactive to light.     Neck:      Musculoskeletal: Normal range of motion and neck supple.   Cardiovascular:      Rate and Rhythm: Normal rate and regular rhythm.      Heart sounds: Normal heart sounds. No murmur. No friction rub. No gallop.    Pulmonary:      Effort: Pulmonary effort is normal. No respiratory distress.      Breath sounds: Normal  breath sounds.   Abdominal:      General: Bowel sounds are normal. There is no distension.      Palpations: Abdomen is soft.      Tenderness: There is no abdominal tenderness.   Musculoskeletal: Normal range of motion.         General: Deformity ( wasting of his thenar eminence) present. No tenderness.   Skin:     General: Skin is warm and dry.      Capillary Refill: Capillary refill takes less than 2 seconds.      Findings: Lesion ( coccygeal pressure wound, bilateral heel pressure wounds) present.   Neurological:      General: No focal deficit present.      Mental Status: He is alert and oriented to person, place, and time.      Cranial Nerves: No cranial nerve deficit.   Psychiatric:         Mood and Affect: Mood normal.         Behavior: Behavior normal.         Labs:   Labs Reviewed   BASIC METABOLIC PANEL - Abnormal; Notable for the following components:       Result Value    CO2 TOTAL 20 (*)     CREATININE 1.43 (*)     ESTIMATED GFR 53 (*)     All other components within normal limits    Narrative:     Hemolysis can alter results at this level (slight).  Estimated Glomerular Filtration Rate (eGFR) calculated using the CKD-EPI (2009) equation, intended for patients 63 years of age and older. If race and/or gender is not documented or "unknown," there will be no eGFR calculation.   C-REACTIVE PROTEIN(CRP),INFLAMMATION - Abnormal; Notable for the following components:    CRP INFLAMMATION 210.2 (*)     All other components within normal limits   VENOUS BLOOD GAS/LACTATE - Abnormal; Notable for the following components:    PO2 (VENOUS) 32.0 (*)     All other components within normal limits   HEPATIC FUNCTION PANEL - Abnormal; Notable for the following components:    ALBUMIN 2.5 (*)     ALKALINE PHOSPHATASE 120 (*)  BILIRUBIN DIRECT 0.3 (*)     All other components within normal limits    Narrative:     Hemolysis can alter results at this level (slight).   CBC WITH DIFF - Abnormal; Notable for the following  components:    WBC 17.0 (*)     RBC 4.17 (*)     HGB 12.5 (*)     HCT 37.0 (*)     NEUTROPHIL # 13.92 (*)     MONOCYTE # 1.21 (*)     IMMATURE GRANULOCYTE # 0.11 (*)     All other components within normal limits   HEPATITIS C ANTIBODY SCREEN WITH REFLEX TO HCV PCR - Normal   HIV1/HIV2 SCREEN, COMBINED ANTIGEN AND ANTIBODY - Normal   MAGNESIUM - Normal    Narrative:     Hemolysis can alter results at this level (slight).   ADULT ROUTINE BLOOD CULTURE, SET OF 2 BOTTLES (BACTERIA AND YEAST)   ADULT ROUTINE BLOOD CULTURE, SET OF 2 BOTTLES (BACTERIA AND YEAST)   CBC/DIFF    Narrative:     The following orders were created for panel order CBC/DIFF.  Procedure                               Abnormality         Status                     ---------                               -----------         ------                     CBC WITH [facility][[id number]]                Abnormal            Final result                 Please view results for these tests on the individual orders.   VANCOMYCIN, RANDOM   PERFORM POC WHOLE BLOOD GLUCOSE       Imaging:  No orders to display     MDM/Course:  Rondarius Kadrmas is a 58 y.o. male who presented with eye swelling, cellulitis, abscess, drainage.     Given patients history, current symptoms, and exam; concern for, but not limited to, preseptal cellulitis, orbital cellulitis, preseptal abscess, erosive sinusitis.   Patient has a high likelihood of decompensation due to the current condition.   Patient was tachycardic, febrile upon arrival.  He had received antibiotics at the outside facility.  Ophthalmology was present at bedside upon patient arrival as they were expecting him.  Ophthalmology performed bedside incision and drainage.  Recommending admission to Medicine for IV antibiotics and monitoring.   Wound culture from outside facility revealed MRSA, resistant to clindamycin.   Patient had blood cultures drawn, vanc level sent as patient had antibiotics this morning around 06:00 at the outside  facility per records that were sent.  He had 1.5 g of vancomycin as well as 2 g of ceftriaxone at 06:00.   Discussed the case with Medicine and Medicine is agreeable to admit the patient for further workup and management.   Management and treatment decisions made amidst VWPVX-48 public health emergency; admission vs. discharge standard has  necessarily shifted.     ED Course as of Aug 12 147   Wed Aug 11, 2019   1853 Medicine admit    [BD]      ED Course User Index  [BD] Radames Mejorado, Aaron Edelman, MD       Clinical Impression:     Encounter Diagnoses   Name Primary?   [name] Kitchen Abscess of left periorbital region Yes   . Preseptal cellulitis of left eye    . Sepsis due to methicillin resistant Staphylococcus aureus (MRSA) without acute organ dysfunction (CMS HCC)        Medications given:  Medications   morphine 4 mg/mL injection (4 mg Intravenous Given 08/11/19 1816)   LR bolus infusion 1,000 mL (1,000 mL Intravenous New Bag/New Syringe 08/11/19 1903)   acetaminophen (TYLENOL) tablet (975 mg Oral Given 08/11/19 1858)         Disposition: Admitted    Parts of this patients chart were completed in a retrospective fashion due to simultaneous direct patient care activities in the Emergency Department.   This note was partially generated using MModal Fluency Direct system, and there may be some incorrect words, spellings, and punctuation that were not noted in checking the note before saving.      Georgetta Haber, MD 08/12/2019, 01:54   Assistant Professor - Emergency Medicine  [name]  Pager # - [facility]

## 2019-08-11 NOTE — Nurses Notes (Signed)
Patient arrived to the unit via transport. Patient oriented to room and unit. Fall precautions initiated. Sitter select on and audible. Call bell within reach. Admission and assessment charted per flow sheets.

## 2019-08-11 NOTE — ED Nurses Note (Signed)
Optho attending and resident performing exam and procedure at this time

## 2019-08-11 NOTE — Pharmacy (Signed)
[facility] / Department of Pharmaceutical Services  Therapeutic Drug Monitoring: Vancomycin  08/11/2019      Patient name: [name], [name] [name]  Date of Birth:  01-23-1961    Actual Weight:  Weight: 86.2 kg (190 lb) (08/11/19 1748)     BMI:  BMI (Calculated): 26.18 (08/11/19 1748)    Date RPh Current regimen (including mg/kg) Indication Target Levels (mcg/mL) SCr (mg/dL) CrCl* (mL/min) Measured level (mcg/mL) Plan (including when levels are due) Comments   12/2 VK        Received Vanc 1500 mg on 12/1 @ 1500 and CTX 2g on 12/1@1730  and 12/2 @1200  from OSH   08/11/19 LL Initiating. Skin/soft tissue 10-15 1.43 60.2 - Vancomycin 1,750mg  (20 mg/kg actual BW) IV q24hr. Please obtain trough before 4th dose (on 12/4).                                                                  *Creatinine clearance is estimated by using the Cockcroft-Gault equation for adult patients and the Carol Ada for pediatric patients.    The decision to discontinue vancomycin therapy will be determined by the primary service.  Please contact the pharmacist with any questions regarding this patient's medication regimen.

## 2019-08-11 NOTE — ED Nurses Note (Signed)
Patient arrives via EMS for periorbital swelling and redness. Patient states he he scratched his forehead about 8 days ago with bhis fingernail and redness migrated to left eye. Patient has redness and lesions around left eye and purulent drainage coming from forehead. Optho attending at bedside

## 2019-08-12 ENCOUNTER — Inpatient Hospital Stay (HOSPITAL_COMMUNITY)
Admission: RE | Admit: 2019-08-12 | Discharge: 2019-08-12 | Disposition: A | Discharge status: Home / Self Care | Payer: Medicare PPO | Source: Ambulatory Visit

## 2019-08-12 ENCOUNTER — Encounter (HOSPITAL_COMMUNITY): Payer: Self-pay | Admitting: Internal Medicine

## 2019-08-12 DIAGNOSIS — L97529 Non-pressure chronic ulcer of other part of left foot with unspecified severity: Secondary | ICD-10-CM

## 2019-08-12 DIAGNOSIS — A4102 Sepsis due to Methicillin resistant Staphylococcus aureus: Secondary | ICD-10-CM | POA: Diagnosis present

## 2019-08-12 DIAGNOSIS — E46 Unspecified protein-calorie malnutrition: Secondary | ICD-10-CM

## 2019-08-12 DIAGNOSIS — M85872 Other specified disorders of bone density and structure, left ankle and foot: Secondary | ICD-10-CM

## 2019-08-12 DIAGNOSIS — A419 Sepsis, unspecified organism: Secondary | ICD-10-CM

## 2019-08-12 DIAGNOSIS — E1142 Type 2 diabetes mellitus with diabetic polyneuropathy: Secondary | ICD-10-CM

## 2019-08-12 DIAGNOSIS — M86172 Other acute osteomyelitis, left ankle and foot: Secondary | ICD-10-CM

## 2019-08-12 DIAGNOSIS — R937 Abnormal findings on diagnostic imaging of other parts of musculoskeletal system: Secondary | ICD-10-CM

## 2019-08-12 DIAGNOSIS — L03213 Periorbital cellulitis: Secondary | ICD-10-CM | POA: Diagnosis present

## 2019-08-12 DIAGNOSIS — L97429 Non-pressure chronic ulcer of left heel and midfoot with unspecified severity: Secondary | ICD-10-CM

## 2019-08-12 LAB — BASIC METABOLIC PANEL
ANION GAP: 9 mmol/L (ref 4–13)
BUN/CREA RATIO: 14 (ref 6–22)
BUN: 21 mg/dL (ref 8–25)
CALCIUM: 8.6 mg/dL (ref 8.5–10.2)
CHLORIDE: 108 mmol/L (ref 96–111)
CO2 TOTAL: 19 mmol/L — ABNORMAL LOW (ref 22–32)
CREATININE: 1.45 mg/dL — ABNORMAL HIGH (ref 0.62–1.27)
ESTIMATED GFR: 52 mL/min/{1.73_m2} — ABNORMAL LOW (ref >60)
GLUCOSE: 185 mg/dL — ABNORMAL HIGH (ref 65–139)
POTASSIUM: 4.4 mmol/L (ref 3.5–5.1)
SODIUM: 136 mmol/L (ref 136–145)

## 2019-08-12 LAB — CBC WITH DIFF
BASOPHIL #: <0.1 10*3/uL (ref <=0.20)
BASOPHIL %: 1 %
EOSINOPHIL #: 0.28 10*3/uL (ref <=0.50)
EOSINOPHIL %: 2 %
HCT: 33.8 % — ABNORMAL LOW (ref 38.9–52.0)
HGB: 11.3 g/dL — ABNORMAL LOW (ref 13.4–17.5)
IMMATURE GRANULOCYTE #: <0.1 10*3/uL (ref <0.10)
IMMATURE GRANULOCYTE %: 1 % (ref 0–1)
LYMPHOCYTE #: 1.46 10*3/uL (ref 1.00–4.80)
LYMPHOCYTE %: 12 %
MCH: 30.1 pg (ref 26.0–32.0)
MCHC: 33.4 g/dL (ref 31.0–35.5)
MCV: 90.1 fL (ref 78.0–100.0)
MONOCYTE #: 0.78 10*3/uL (ref 0.20–1.10)
MONOCYTE %: 6 %
MPV: 10.4 fL (ref 8.7–12.5)
NEUTROPHIL #: 9.49 10*3/uL — ABNORMAL HIGH (ref 1.50–7.70)
NEUTROPHIL %: 78 %
PLATELETS: 184 10*3/uL (ref 150–400)
RBC: 3.75 10*6/uL — ABNORMAL LOW (ref 4.50–6.10)
RDW-CV: 12.8 % (ref 11.5–15.5)
WBC: 12.2 10*3/uL — ABNORMAL HIGH (ref 3.7–11.0)

## 2019-08-12 LAB — ECG 12-LEAD
Atrial Rate: 94 {beats}/min
Calculated P Axis: 35 degrees
Calculated R Axis: -80 degrees
Calculated T Axis: 45 degrees
PR Interval: 154 ms
QRS Duration: 102 ms
QT Interval: 378 ms
QTC Calculation: 472 ms
Ventricular rate: 94 {beats}/min

## 2019-08-12 LAB — HEPATIC FUNCTION PANEL
ALBUMIN: 2.1 g/dL — ABNORMAL LOW (ref 3.5–5.0)
ALKALINE PHOSPHATASE: 98 U/L (ref 45–115)
ALT (SGPT): 5 U/L (ref <55)
AST (SGOT): 12 U/L (ref 8–48)
BILIRUBIN DIRECT: 0.2 mg/dL (ref <0.3)
BILIRUBIN TOTAL: 0.4 mg/dL (ref 0.3–1.3)
PROTEIN TOTAL: 6.3 g/dL — ABNORMAL LOW (ref 6.4–8.3)

## 2019-08-12 LAB — POC BLOOD GLUCOSE (RESULTS)
GLUCOSE, POC: 188 mg/dL — ABNORMAL HIGH (ref 70–105)
GLUCOSE, POC: 167 mg/dL — ABNORMAL HIGH (ref 70–105)
GLUCOSE, POC: 295 mg/dL — ABNORMAL HIGH (ref 70–105)
GLUCOSE, POC: 139 mg/dL — ABNORMAL HIGH (ref 70–105)

## 2019-08-12 LAB — HGA1C (HEMOGLOBIN A1C WITH EST AVG GLUCOSE)
ESTIMATED AVERAGE GLUCOSE: 206 mg/dL
HEMOGLOBIN A1C: 8.8 % — ABNORMAL HIGH (ref 4.0–5.6)

## 2019-08-12 LAB — C-REACTIVE PROTEIN(CRP),INFLAMMATION: CRP INFLAMMATION: 154.1 mg/L — ABNORMAL HIGH (ref <=8.0)

## 2019-08-12 LAB — PHOSPHORUS: PHOSPHORUS: 3 mg/dL (ref 2.4–4.7)

## 2019-08-12 LAB — MAGNESIUM: MAGNESIUM: 1.7 mg/dL (ref 1.6–2.6)

## 2019-08-12 MED ORDER — SENNOSIDES 8.6 MG-DOCUSATE SODIUM 50 MG TABLET
1.0000 | ORAL_TABLET | Freq: Two times a day (BID) | ORAL | Status: DC | PRN
Start: 2019-08-12 — End: 2019-08-13

## 2019-08-12 MED ORDER — POLYETHYLENE GLYCOL 3350 17 GRAM ORAL POWDER PACKET
17.0000 g | Freq: Every day | ORAL | Status: DC
Start: 2019-08-12 — End: 2019-08-17
  Administered 2019-08-12 – 2019-08-16 (×5): 17 g via ORAL
  Administered 2019-08-17: 09:00:00 0 g via ORAL
  Filled 2019-08-12 (×6): qty 1

## 2019-08-12 MED ORDER — METRONIDAZOLE 250 MG TABLET
500.00 mg | ORAL_TABLET | Freq: Three times a day (TID) | ORAL | Status: DC
Start: 2019-08-12 — End: 2019-08-17
  Administered 2019-08-12 – 2019-08-17 (×15): 500 mg via ORAL
  Filled 2019-08-12 (×16): qty 2

## 2019-08-12 MED ORDER — MAGNESIUM SULFATE 2 GRAM/50 ML (4 %) IN WATER INTRAVENOUS PIGGYBACK
2.0000 g | INJECTION | Freq: Once | INTRAVENOUS | Status: AC
Start: 2019-08-12 — End: 2019-08-12
  Administered 2019-08-12: 2 g via INTRAVENOUS
  Administered 2019-08-12: 10:00:00 0 g via INTRAVENOUS
  Filled 2019-08-12: qty 50

## 2019-08-12 NOTE — Transitional Care (Signed)
[facility] Medicine   Transition of Care Coordination   PCP Verification        Name: Kalid Ghan   Date of Birth: 1960-10-02 58 y.o.  Date of service: 08/12/2019  Lay Caregiver:  ,  ,           1.[place] office to verify establishment and determine barriers to follow-up. [facility] verified as patient's PCP. Last visit: 07/22/19   2. Appointment availability within 7 days of discharge: Yes  3. Does patient have an upcoming appointment scheduled in the next 30 days?: No  4. PCP agreeable to follow warfarin/INR if applicable: Yes  5. PCP agreeable to follow home health if applicable: Yes  6. PCP office offers designated personnel for care coordination: No  7. PCP contact information correct: Yes      [name]  08/12/2019, 15:00

## 2019-08-12 NOTE — Consults (Signed)
[facility]  [facility] Advanced Wound Care Initial Consult    [name] [name], [name] [name], 58 y.o. male  MRN: [id number]  Date of Birth:  [birth year]  Date of service: 08/12/2019  Encounter Start Date: 08/11/2019  Inpatient Admission Date:  08/11/2019  Hospital Day:  LOS: 1 day     Information Obtained from: patient and history reviewed via medical record  Chief Complaint: open wound to bilateral heels    PCP: Braxton By: Medicine 5- Leanor Kail, MD   08/11/19 1956  IP CONSULT TO ADVANCED WOUND CARE TEAM ONE TIME Complete   Process Instructions: Please call extension [phone] from Monday-Friday 7:30am - 3:30pm with questions/concerns.     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 Advance Wound Care was consulted on admission for chronic wounds to patient bilateral heels. Patient states he has had the wounds for over a year. He is unsure how they started but states he has severe peripheral neuropathy that he takes Neurontin for. He states the L foot has always been worse and reports he had osteomyelitis at one point and was on antibiotics for 6 weeks. He reports minimal drainage. He states he follows with a [facility] in [place]. He states he has been doing wet to dry dressings at home once a day. He states he has home health and then his daughter also helps with dressing changes. He does not have any kind of offloading shoe or boot. Pt reports recent fever/chills as why he came into hospital, denies N/V/D/C, redness/streaking, malodorous drainage or increased pain  of wounds to heels.      PAST MEDICAL/ FAMILY/ SOCIAL HISTORY:       Past Medical History:   Diagnosis Date   . Diabetes mellitus, type 2 (CMS HCC)          No Known Allergies  Medications Prior to Admission     Prescriptions    gabapentin (NEURONTIN) 400 mg Oral Capsule    Take 1,200 mg by mouth Three times a day    insulin glargine (LANTUS) 100 unit/mL Subcutaneous injection (vial)    20 Units by Subcutaneous route Every night           .  acetaminophen (TYLENOL) tablet, 650 mg, Oral, Q4H PRN    .  cefepime (MAXIPIME) 2 g in NS 100 mL IVPB, 2 g, Intravenous, Q12H    .  gabapentin (NEURONTIN) capsule, 400 mg, Oral, 3x/day    .  heparin 5,000 unit/mL injection, 5,000 Units, Subcutaneous, Q8HRS    .  metroNIDAZOLE (FLAGYL) tablet, 500 mg, Oral, 3x/day    .  NS flush syringe, 2-6 mL, Intracatheter, Q8HRS    And    .  NS flush syringe, 2-6 mL, Intracatheter, Q1 MIN PRN    .  NS premix infusion, , Intravenous, Continuous    .  oxyCODONE (ROXICODONE) immediate release tablet, 5 mg, Oral, Q4H PRN    .  oxyCODONE (ROXICODONE) immediate release  tablet, 10 mg, Oral, Q4H PRN    .  polyethylene glycol (MIRALAX) oral packet, 17 g, Oral, Daily    .  sennosides-docusate sodium (SENOKOT-S) 8.6-50mg  per tablet, 1 Tab, Oral, 2x/day PRN    .  SSIP insulin lispro (HUMALOG) 100 units/mL injection, 0-12 Units, Subcutaneous, 4x/day PRN    .  vancomycin (VANCOCIN) 1,750 mg in NS 500 mL IVPB, 20 mg/kg, Intravenous, Q24H    .  Vancomycin IV - Pharmacist to Dose per Protocol, , Does not apply, Daily PRN      Past Surgical History:   Procedure Laterality Date   . MANDIBLE SURGERY         Family History: No family history per patient    Social History     Tobacco Use   . Smoking status: Former Smoker     Types: Cigars   . Smokeless tobacco: Never Used   Substance Use Topics   . Alcohol use: Yes     Comment: occasionally   . Drug use: Never       ROS:  MUST comment on all "Abnormal" findings   Constitutional: + fevers, chills, negative for  fatigue and weight loss  Eyes: + for visual disturbance to L eye, with eyelid swelling and drainage from wound over left eye  ENT: negative for hearing loss and sore throat  Respiratory: negative for cough, wheezing  Cardiovascular: negative for chest pressure/discomfort, no claudication, no lower extremity edema  Gastrointestinal: negative for dysphagia, nausea, vomiting, diarrhea, decrease in appetite   Integumentary: negative for rash, changes in skin color, dryness + skin lesion to bilateral heels  Musculoskeletal: + weakness, negative for myalgias, joint pain  Neurological: negative for dizziness, no paresthesia/paraplegia  Behavioral/Psych: negative for anxiety, depression  All remaining systems negative.    PHYSICAL EXAMINATION: MUST comment on all "Abnormal" findings    Exam Temperature: 37.3 C (99.2 F)  Heart Rate: 73  BP (Non-Invasive): (!) 145/77(RN notified)  Respiratory Rate: 18  SpO2: 98 %  Pain Score (Numeric, Faces): 8    Constitutional: obese, acutely ill; appears in no acute distress   Eyes: PERRL, conjunctiva non-icteric, +swelling, drainage, erythema to L periorbital region  ENT: mucous membranes moist, trachea midline  Respiratory: non-labored on room air, no cyanosis  Cardiovascular: no edema, 2+ bilateral dorsalis pedis pulses  Neurologic: A&Ox3; grossly intact; peripheral neuropathy bilateral upper and lower extremities  Musculoskeletal: Equal strength to bilateral upper/lower extremities, head normocephalic  Psychiatric: Normal affect; cooperative with exam, pleasant  Integumentary: skin warm and dry, open wound to left heel, blanchable hyperpigmentation to right heel with intact skin , open wound to left periorbital region,  no signs/symptoms of infection, no hemosiderin staining noted, + erythema, no xerosis    Wound #1  Type: Diabetic  Location: left heel  Length: 4.9 cm  Width: 3 cm  Depth: 0.1 cm  Undermining/Tunneling: 0.4-1cm from 10-12 o'clock  Wound Base: granulation  Wound Edges:  rolled, callus  Drainage Amount: Moderate  Serosanguineous drainage with odor Absent  Periwound: with periwound erythema, without crepitus, necrosis or gangrene          Wound #2  Type:Healing Diabetic foot ulver versus Deep tissue injury  Location: Right heel  Wound Base: blanchable hyperpigmentation   Wound Edges: smooth  Drainage Amount: None  Periwound: without periwound erythema, crepitus, necrosis or gangrene            Labs Ordered/ Reviewed (Please indicate ordered or reviewed)   Reviewed: Labs:  Lab Results  Today:    Results for orders placed or performed during the hospital encounter of 08/11/19 (from the past 24 hour(s))   HEPATITIS C ANTIBODY SCREEN WITH REFLEX TO HCV PCR   Result Value Ref Range    HCV ANTIBODY QUALITATIVE Negative Negative   HIV1/HIV2 SCREEN, COMBINED ANTIGEN AND ANTIBODY   Result Value Ref Range    HIV SCREEN, COMBINED ANTIGEN & ANTIBODY Negative Negative   BASIC METABOLIC PANEL   Result Value Ref Range    SODIUM 138 136 - 145 mmol/L    POTASSIUM 3.8 3.5 - 5.1 mmol/L    CHLORIDE 105 96 - 111 mmol/L    CO2 TOTAL 20 (L) 22 - 32 mmol/L    ANION GAP 13 4 - 13 mmol/L    CALCIUM 9.3 8.5 - 10.2 mg/dL    GLUCOSE 78 65 - 139 mg/dL    BUN 23 8 - 25 mg/dL    CREATININE 1.43 (H) 0.62 - 1.27 mg/dL    BUN/CREA RATIO 16 6 - 22    ESTIMATED GFR 53 (L) >60 mL/min/1.63m^2   C-REACTIVE PROTEIN(CRP)   Result Value Ref Range    CRP INFLAMMATION 210.2 (H) <=8.0 mg/L   HEPATIC FUNCTION PANEL   Result Value Ref Range    ALBUMIN 2.5 (L) 3.5 - 5.0 g/dL    ALKALINE PHOSPHATASE 120 (H) 45 - 115 U/L    ALT (SGPT) 6 <55 U/L    AST (SGOT) 16 8 - 48 U/L    BILIRUBIN TOTAL 0.7 0.3 - 1.3 mg/dL    BILIRUBIN DIRECT 0.3 (H) <0.3 mg/dL    PROTEIN TOTAL 7.5 6.4 - 8.3 g/dL   MAGNESIUM   Result Value Ref Range    MAGNESIUM 1.8 1.6 - 2.6 mg/dL   CBC WITH DIFF   Result Value Ref Range    WBC 17.0 (H) 3.7 - 11.0 x10^3/uL    RBC 4.17 (L) 4.50 - 6.10 x10^6/uL    HGB 12.5 (L) 13.4 - 17.5 g/dL    HCT 37.0 (L) 38.9 - 52.0 %    MCV  88.7 78.0 - 100.0 fL    MCH 30.0 26.0 - 32.0 pg    MCHC 33.8 31.0 - 35.5 g/dL    RDW-CV 12.8 11.5 - 15.5 %    PLATELETS 256 150 - 400 x10^3/uL    MPV 10.5 8.7 - 12.5 fL    NEUTROPHIL % 82 %    LYMPHOCYTE % 8 %    MONOCYTE % 7 %    EOSINOPHIL % 1 %    BASOPHIL % 1 %    NEUTROPHIL # 13.92 (H) 1.50 - 7.70 x10^3/uL    LYMPHOCYTE # 1.40 1.00 - 4.80 x10^3/uL    MONOCYTE # 1.21 (H) 0.20 - 1.10 x10^3/uL    EOSINOPHIL # 0.22 <=0.50 x10^3/uL    BASOPHIL # 0.11 <=0.20 x10^3/uL    IMMATURE GRANULOCYTE % 1 0 - 1 %    IMMATURE GRANULOCYTE # 0.11 (H) <0.10 x10^3/uL   HGA1C (HEMOGLOBIN A1C WITH EST AVG GLUCOSE)   Result Value Ref Range    HEMOGLOBIN A1C 8.8 (H) 4.0 - 5.6 %    ESTIMATED AVERAGE GLUCOSE 206 mg/dL   ECG 12-LEAD   Result Value Ref Range    Ventricular rate 94 BPM    Atrial Rate 94 BPM    PR Interval 154 ms    QRS Duration 102 ms    QT Interval 378 ms    QTC Calculation 472 ms  Calculated P Axis 35 degrees    Calculated R Axis -80 degrees    Calculated T Axis 45 degrees   VENOUS BLOOD GAS/LACTATE   Result Value Ref Range    %FIO2 (VENOUS) 21.0 %    PH (VENOUS) 7.39 7.31 - 7.41    PCO2 (VENOUS) 41.00 41.00 - 51.00 mm/Hg    PO2 (VENOUS) 32.0 (L) 35.0 - 50.0 mm/Hg    BASE DEFICIT 0.2 -3.0 - 3.0 mmol/L    BICARBONATE (VENOUS) 23.8 22.0 - 26.0 mmol/L    LACTATE 1.1 0.0 - 1.3 mmol/L   VANCOMYCIN, RANDOM   Result Value Ref Range    VANCOMYCIN RANDOM 7.3   ug/mL    DOSE DATE 08/11/2019     DOSE TIME  6:00 AM    STERILE SITE CULTURE AND GRAM STAIN, AEROBIC    Specimen: Abscess; Other   Result Value Ref Range    GRAM STAIN 4+ Many PMNs (A)     GRAM STAIN 3+ Several Gram Positive Cocci (A)    SODIUM, RANDOM URINE   Result Value Ref Range    SODIUM RANDOM URINE 65 No Reference Range Established mmol/L   CREATININE URINE, RANDOM   Result Value Ref Range    CREATININE RANDOM URINE 82 No Reference Range Established mg/dL   STREPTOCOCCUS PNEUMONIAE ANTIGEN,URINE    Specimen: Urine, Site not specified   Result Value Ref Range     S.PNEUMONIA ANTIGEN Negative Negative, Indeterminate   LEGIONELLA URINE ANTIGEN    Specimen: Urine, Site not specified   Result Value Ref Range    LEGIONELLA ANTIGEN Negative Negative, Indeterminate   URINALYSIS, MACROSCOPIC   Result Value Ref Range    SPECIFIC GRAVITY 1.014 1.005 - 1.030    GLUCOSE Negative Negative mg/dL    PROTEIN > 500 (A) Negative mg/dL    BILIRUBIN Negative Negative mg/dL    UROBILINOGEN Negative Negative mg/dL    PH 5.0 5.0 - 8.0    BLOOD Small (A) Negative mg/dL    KETONES Negative Negative mg/dL    NITRITE Negative Negative    LEUKOCYTES Negative Negative WBCs/uL    APPEARANCE Clear Clear    COLOR Normal (Yellow) Normal (Yellow)   URINALYSIS, MICROSCOPIC   Result Value Ref Range    WBCS 1.0 <4.0 /hpf    RBCS 4.0 <6.0 /hpf    BACTERIA Occasional or less Occasional or less /hpf    MUCOUS Light Light /lpf   POC BLOOD GLUCOSE (RESULTS)   Result Value Ref Range    GLUCOSE, POC 217 (H) 70 - 105 mg/dl   MAGNESIUM   Result Value Ref Range    MAGNESIUM 1.7 1.6 - 2.6 mg/dL   BASIC METABOLIC PANEL   Result Value Ref Range    SODIUM 136 136 - 145 mmol/L    POTASSIUM 4.4 3.5 - 5.1 mmol/L    CHLORIDE 108 96 - 111 mmol/L    CO2 TOTAL 19 (L) 22 - 32 mmol/L    ANION GAP 9 4 - 13 mmol/L    CALCIUM 8.6 8.5 - 10.2 mg/dL    GLUCOSE 185 (H) 65 - 139 mg/dL    BUN 21 8 - 25 mg/dL    CREATININE 1.45 (H) 0.62 - 1.27 mg/dL    BUN/CREA RATIO 14 6 - 22    ESTIMATED GFR 52 (L) >60 mL/min/1.3m^2   PHOSPHORUS   Result Value Ref Range    PHOSPHORUS 3.0 2.4 - 4.7 mg/dL   HEPATIC FUNCTION PANEL   Result  Value Ref Range    ALBUMIN 2.1 (L) 3.5 - 5.0 g/dL    ALKALINE PHOSPHATASE 98 45 - 115 U/L    ALT (SGPT) 5 <55 U/L    AST (SGOT) 12 8 - 48 U/L    BILIRUBIN TOTAL 0.4 0.3 - 1.3 mg/dL    BILIRUBIN DIRECT 0.2 <0.3 mg/dL    PROTEIN TOTAL 6.3 (L) 6.4 - 8.3 g/dL   CBC WITH DIFF   Result Value Ref Range    WBC 12.2 (H) 3.7 - 11.0 x10^3/uL    RBC 3.75 (L) 4.50 - 6.10 x10^6/uL    HGB 11.3 (L) 13.4 - 17.5 g/dL    HCT 33.8 (L) 38.9 -  52.0 %    MCV 90.1 78.0 - 100.0 fL    MCH 30.1 26.0 - 32.0 pg    MCHC 33.4 31.0 - 35.5 g/dL    RDW-CV 12.8 11.5 - 15.5 %    PLATELETS 184 150 - 400 x10^3/uL    MPV 10.4 8.7 - 12.5 fL    NEUTROPHIL % 78 %    LYMPHOCYTE % 12 %    MONOCYTE % 6 %    EOSINOPHIL % 2 %    BASOPHIL % 1 %    NEUTROPHIL # 9.49 (H) 1.50 - 7.70 x10^3/uL    LYMPHOCYTE # 1.46 1.00 - 4.80 x10^3/uL    MONOCYTE # 0.78 0.20 - 1.10 x10^3/uL    EOSINOPHIL # 0.28 <=0.50 x10^3/uL    BASOPHIL # <0.10 <=0.20 x10^3/uL    IMMATURE GRANULOCYTE % 1 0 - 1 %    IMMATURE GRANULOCYTE # <0.10 <0.10 x10^3/uL   POC BLOOD GLUCOSE (RESULTS)   Result Value Ref Range    GLUCOSE, POC 188 (H) 70 - 105 mg/dl   POC BLOOD GLUCOSE (RESULTS)   Result Value Ref Range    GLUCOSE, POC 167 (H) 70 - 105 mg/dl       ALBUMIN (g/dL)   Date Value   08/12/2019 2.1 (L)   08/11/2019 2.5 (L)     No results found for: PREALBUMIN  HEMOGLOBIN A1C (%)   Date Value   08/11/2019 8.8 (H)         Radiology Tests Ordered/ Reviewed (Please indicate ordered or reviewed)   Reviewed: I have reviewed all radiology results for this encounter      Ordered: X-ray L foot     Assessment/Plan   58 y.o. 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 Advance Wound Care was consulted for chronic wounds to patient bilateral heels.    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   -Clean wound with wound cleanser and apply fibracol to wound bed, cover with mepilex border heel daily  -Offload heels from bed with placement of pillow underneath calves  -X-ray to rule out osteomylitis  -Recommend podiatry follow up outpatient for diabetic shoe fitting    Deep tissue injury versus healing diabetic foot ulcer- present on admission  -Clean area with wound cleanser and apply mepilex border heel daily  -Offload heels from bed with placement of pillow underneath calves    Protein Malnutrition  -Albumin 2.1 on 12/3  -Recommend nutrition services consult  for protein malnutrition    Type 2 DM with long term insulin use  -HA1C Level 8.8 on 08/11/2019  -Tight glycemic control. Management per primary team  -Recommend consult to Diabetic Educator    Peripheral Neuropathy  -OT consulted for d/c recommendations  and recommending inpatient rehab facility  management per primary team    -Case management for home health needs and discharge planning  -Follow up should be scheduled upon discharge with the [facility] [phone] or patients own Wound Care center.    -We will continue to follow patient on a weekly basis. Please call if there are any questions/concerns prior to follow up.    Glean Hess, APRN,NP-C  08/12/2019, 12:11  [facility]  Ext: [phone]        The patient was seen independently by the APP  Unless directly noted, I did not evaluate the patient but was accessible by phone.  Signature required by employer    Roslyn Smiling, MD

## 2019-08-12 NOTE — Care Management Notes (Signed)
[place] Management Initial Evaluation    Patient Name: [name] [name]  Date of Birth: Oct 05, 1960  Sex: male  Date/Time of Admission: 08/11/2019  5:44 PM  Room/Bed: 12/A  Payor: [place] MEDICARE / Plan: [place] MEDICARE ADVANTAGE PPO / Product Type: PPO /   Primary Care Providers:  [facility] [place] (General)    Pharmacy Info:   Preferred Pharmacy       [facility], [address] [place], [facility]           Emergency Contact Info:   Extended Emergency Contact Information  Primary Emergency Contact: FRANCIS, DOENGES ADAM  Mobile Phone: [phone]  Relation: Son    History:   [name] [name] is a 41 y.o., male, admitted Abscess of Left Periorbital region    Height/Weight: 181.6 cm (5' 11.5") / 84.4 kg (186 lb 1.1 oz)     LOS: 1 day   Admitting Diagnosis: Abscess of left periorbital region [H05.012]       08/12/19 1418   Assessment Details   Assessment Type Admission   Date of Care Management Update 08/12/19   Date of Next DCP Update 08/13/19   Care Management Plan   Discharge Planning Status initial meeting   Projected Discharge Date 08/14/19   Discharge plan discussed with: Patient   CM will evaluate for rehabilitation potential yes   Discharge Needs Assessment   Outpatient/Agency/Support Group Needs meal delivery service   Equipment Currently Used at Home cane, straight;commode;bath bench;walker, standard   Equipment Needed After Discharge other (see comments)   Community Agency Name(s) TBD   Discharge Facility/Level of Care Needs Home vs SNF;Home with Home Health and DME (code 6)   Transportation Available family or friend will provide;car   Referral Information   Admission Type observation   Address Verified verified-no changes   Arrived From acute hospital, other   [place]   ([place])   Insurance Verified verified-no change   Observation Form   Observation Form Given MOON form given   MOON form explained/reviewed with:  Patient   MOON form given to patient   MOON form date of delivery  08/12/19   MOON form time of delivery 1215   ADVANCE DIRECTIVES   Does the Patient have an Advance Directive? No, Information Offered and Refused   Patient Requests Assistance in Having Advance Directive Notarized. N/A   LAY CAREGIVER    Appointed Lay Caregiver? I Decline   Employment/Financial   Patient has Prescription Coverage?  Yes        Name of Insurance Coverage for Medications [place] Medicare advantage   Financial Concerns none   Living Environment   Select an age group to open "lives with" row.  Adult   Lives With child(ren), adult   Living Arrangements house   Able to Return to Prior Arrangements other (see comments)   Home Safety   Home Assessment: No Problems Identified   Home Accessibility no concerns   Legal Issues   Do you have a court appointed guardian/conservator? No   Patient Hand-Off   Clinical/Discharge Plan of Care Information Communicated to:  Clinical Care Coordinator   Comments Vernelle Emerald RN [id number]     Discharge Plan:  Home vs SNF, Home with Home Health and DME (code 6)  Pt [facility] ED for care of periorbital abscess.   Met with pt. today at bedside introduced myself, explained CM role,  completed initial assessment and begin discharge planning. Reviewed Observation  letter information and signature was obtained. Per pt. He lives with his Daughter [name] [name] [phone], he needs help with his daily activities, cooking and cleaning, however when asked if [name] [name] is the one who helps he stated "for my bath, she gives me a bucket and a towel for me to clean my self, she cooks and feeds me when she is there, but she is not the best of house keepers". he uses a cane, walker and commode, no O2. One of Kristen's friends take him to the doctor when he needs to go, he goes to [facility], however he states he has not been there for a long time. He does not have MPOA and he does not want to have one "right now". He uses [facility] @ [address], [place].   And his son [name] [name] [phone] will be providing  Transportation when he is DC. Quita Skye is pt's ER contact.     Called Adam to check on regards transportation, he states he will be able to provide transportation, however he would like to see patient been DC to a rehab place so the patient regains some strength. Quita Skye states he visited with his father on Thanksgiving day, after not seen him since august because of [place] and he found his house to be extremely dirty, maggots in the refrigerator and bottles of alcohol all over the house. Adam took his father to the hospital and called [facility] him self the next day, then his father was DC home with home health and now he is transferred here to [facility]. Quita Skye has been talking with a nurse Lattie Haw and he fax some documents to her this morning.  Adam provided [phone] to get in contact with [facility].     The patient will continue to be evaluated for developing discharge needs.     Case Manager: Genia Plants, RN for Vernelle Emerald RN x [phone]  Phone: [phone]

## 2019-08-12 NOTE — Care Plan (Signed)
Problem: Adult Inpatient Plan of Care  Goal: Plan of Care Review  Outcome: Ongoing (see interventions/notes)  Goal: Patient-Specific Goal (Individualized)  Outcome: Ongoing (see interventions/notes)  Goal: Absence of Hospital-Acquired Illness or Injury  Outcome: Ongoing (see interventions/notes)  Goal: Optimal Comfort and Wellbeing  Outcome: Ongoing (see interventions/notes)  Goal: Rounds/Family Conference  Outcome: Ongoing (see interventions/notes)     Problem: Depression  Goal: Improved Mood  Outcome: Ongoing (see interventions/notes)     Problem: Fall Injury Risk  Goal: Absence of Fall and Fall-Related Injury  Outcome: Ongoing (see interventions/notes)     Problem: Wound  Goal: Optimal Wound Healing  Outcome: Ongoing (see interventions/notes)     Problem: Infection  Goal: Infection Symptom Resolution  Outcome: Ongoing (see interventions/notes)     Problem: Mobility Impairment  Goal: Optimal Mobility  Outcome: Ongoing (see interventions/notes)    Patient has infection and wound on left eye and face. Patient has bilateral wounds on heels. Pt is A&O and complains of intermittent pain.

## 2019-08-12 NOTE — Care Plan (Signed)
[facility]  Occupational Therapy Initial Evaluation    Patient Name: [name] [name]  Date of Birth: 1961/07/24  Height: Height: 181.6 cm (5' 11.5")  Weight: Weight: 84.4 kg (186 lb 1.1 oz)  Room/Bed: 12/A  Payor: [place] MEDICARE / Plan: [place] MEDICARE ADVANTAGE PPO / Product Type: PPO /     Assessment:   [name] [name] demonstrated fair tolerance to OT evaluation this day. He was only willing to engage in limiited activity as he has just finished up with wound care nurse and his feet were painful. Pt. states that he does not move much at home, takes a sponge bath for bathing and uses a BSC. He sleeps on sofa and requires assistance with ADLs. Pt. reports that his balance is very poor and cannot stand or walk although did not want to demonstrate at this time. OT will follow pt. during acute stay. From an OT standpoint at this time, D/C to IPR is recommended to maximize safety and functional IND prior to pt. returning home once he is medically appropriate.       Discharge Needs:   Equipment Recommendation: to be determined    Discharge Disposition: inpatient rehabilitation facility    JUSTIFICATION OF DISCHARGE RECOMMENDATION   Based on current diagnosis, functional performance prior to admission, and current functional performance, this patient requires continued OT services in inpatient rehabilitation facility  in order to achieve significant functional improvements.    Plan:   Current Intervention: ADL retraining, strengthening, transfer training    To provide Occupational therapy services 1x/day, minimum of 2x/week, until discharge, until goals are met.       The risks/benefits of therapy have been discussed with the patient/caregiver and he/she is in agreement with the established plan of care.       Subjective & Objective        08/12/19 1022   Therapist Pager   OT Assigned/ Pager # [name] [name]   Rehab Session   Document Type evaluation   Total OT Minutes: 15   Patient Effort adequate      Symptoms Noted During/After Treatment fatigue;increased pain   Symptoms Noted Comment c/o of B foot pain and left eye pain   General Information   Patient Profile Reviewed yes   General Observations of Patient pt. was pleasant and cooperative   Pertinent History of Current Functional Problem [name] [name] is a 58 y.o., White male with a PMH of insulin dependent type 2 DM who presents form outside facility for concerns of preseptal cellulitis and abscess of left eye. Patient states he scratched himself in the area about 7 days ago while itching himself leaving a cut. This became swollen, red, and painful causing him to present for medical care 4-5 days ago at an outside facility. He was given clindamycin PO for this and sent home. His eye/face continued to worsen so he presented back to the outside facility and was admitted for IV antibiotics given severity and lack of improvement. CT was performed at outside facility concerning for edema/cellulitis of the area and he was sent to [name] for ophthalmology consultation. He states the pain is dull and throbbing and at times is an 8/10 in intensity. He has had associated chills but denies fevers. He states he can open his eye slightly but has difficulty seeing out of it related to the swelling but denies other vision changes. He denies past events similar but does have DM foot wounds that he follows with wound care  for as an outpatient. He also endorses a headache related to bedside I&D performed today but has no other concerns.   Medical Lines PIV Line   Respiratory Status room air   Pre Treatment Status   Pre Treatment Patient Status Patient supine in bed;Call light within reach;Telephone within reach;Sitter select activated;Nurse approved session   Support Present Pre Treatment  None   Communication Pre Treatment  Nurse   Communication Pre Treatment Comment RN medically cleared pt. for OT.    Mutuality/Individual Preferences   Anxieties, Fears or Concerns none voiced    Plan of Care Reviewed With patient   Living Environment   Lives With child(ren), adult   Living Arrangements house   Home Assessment: No Problems Identified   Home Accessibility stairs (1 railing present)   Living Environment Comment 3 steps to enter. railing on Right side ascending   Functional Level Prior   Ambulation 3 - assistive equipment and person   Transferring 3 - assistive equipment and person   Toileting 2 - assistive person   Bathing 2 - assistive person   Dressing 2 - assistive person   Eating 0 - independent   Communication 0 - understands/communicates without difficulty   Self-Care   Current Activity Tolerance fair   Equipment Currently Used at Home yes   Equipment Currently Used at [employer] walker, rolling;commode   Equipment Brought to Hospital none   Vital Signs   O2 Delivery Pre Treatment room air   O2 Delivery Post Treatment room air   Coping/Psychosocial   Observed Emotional State calm;cooperative   Verbalized Emotional State acceptance   Family/Support System   Family/Support Persons family   Involvement in Care not present at bedside   Coping/Psychosocial Response Interventions   Plan Of Care Reviewed With patient   Cognitive Assessment/Interventions   Behavior/Mood Observations behavior appropriate to situation, WNL/WFL   Orientation Status oriented x 4   Attention WNL/WFL   Follows Commands WNL   RUE Assessment   RUE Assessment WFL- Within Functional Limits   LUE Assessment   LUE Assessment WFL- Within Functional Limits   Grip Strength   Grip Left (4/5) good, left   Right Grip (4/5) good, right   Bed Mobility Assessment/Treatment   Bed Mobility, Assistive Device draw sheet   Supine-Sit Independence moderate assist (50% patient effort)   Sit to Supine, Independence moderate assist (50% patient effort)   Safety Issues decreased use of legs for bridging/pushing;decreased use of arms for pushing/pulling;impaired trunk control for bed mobility   Impairments balance impaired;coordination  impaired;endurance;pain;strength decreased   Transfer Assessment/Treatment   Sit-Stand Independence not tested   Stand-Sit Independence not tested   Transfer Comment pt. did not want to attempt standing at this time. he states " oh, I can't stand or walk" although he also states that he does limited walking at home with a FWW.   Upper Body Dressing Assessment/Training   Independence Level  not tested   Lower Body Dressing Assessment/Training   Position supine   Independence Level  dependent (less than 25% patient effort)   Impairments activity tolerance impaired;strength decreased;balance impaired   Post Treatment Status   Post Treatment Patient Status Patient supine in bed;Call light within reach;Telephone within reach;Sports administrator Post Treatment Comment discussed change in pt.'s diet from NPO to diabetic diet. OT assisted pt. with calling to order breakfast.   Care Plan Goals  OT Rehab Goals Transfer Training Goal 2;UB Dressing Goal;Strength Goal;LB Dressing Goal;Toileting Goal   LB Dressing Goal   LB Dressing Goal, Date Established 08/12/19   LB Dressing Goal, Time to Achieve by discharge   LB Dressing Goal, Activity Type all lower body dressing tasks   LB Dressing Goal, Independence Level modified independence   Strength Goal   Strength Goal, Date Established 08/12/19   Strength Goal, Time to Achieve by discharge   Strength Goal, Measure to Achieve 5/5 MMT B UEs   Strength Goal, Functional Goal Increased B UE strength in order to maximize safety and functional IND with ADLs and mobility.   Toileting Goal   Toileting Goal, Date Established 08/12/19   Toileting Goal, Time to Achieve by discharge   Toileting Goal, Activity Type all toileting tasks   Toileting Goal, Independence Level modified independence   Transfer Training Goal 2   Transfer Training Goal, Date Established 08/12/19   Transfer Training Goal,  Time to Achieve by discharge   Transfer Training Goal, Activity Type toilet   Transfer Training Goal, Independence Level modified independence   UB Dressing Goal   UB Dressing  Goal, Date Established 08/12/19   UB Dressing Goal, Time to Achieve by discharge   UB Dressing Goal, Activity Type all upper body dressing tasks   UB Dressing Goal, Independence Level modified independence   Planned Therapy Interventions, OT Eval   Planned Therapy Interventions ADL retraining;strengthening;transfer training   Clinical Impression   Functional Level at Time of Session Mr. Seeley demonstrated fair tolerance to OT evaluation this day. He was only willing to engage in limiited activity as he has just finished up with wound care nurse and his feet were painful. Pt. states that he does not move much at home, takes a sponge bath for bathing and uses a BSC. He sleeps on sofa and requires assistance with ADLs. Pt. reports that his balance is very poor and cannot stand or walk although did not want to demonstrate at this time. OT will follow pt. during acute stay. From an OT standpoint at this time, D/C to IPR is recommended to maximize safety and functional IND prior to pt. returning home once he is medically appropriate.    Criteria for Skilled Therapeutic Interventions Met (OT) yes;skilled treatment is necessary   Rehab Potential good, to achieve stated therapy goals   Therapy Frequency 1x/day;minimum of 2x/week   Predicted Duration of Therapy until discharge;until goals are met   Anticipated Equipment Needs at Discharge to be determined   Anticipated Discharge Disposition inpatient rehabilitation facility   Evaluation Complexity Justification   Occupational Profile Review Expanded review   Performance Deficits 5+ deficits   Clinical Decision Making High analytic complexity   Evaluation Complexity High       Therapist:   Tiney Rouge, OT   Pager #: [phone]

## 2019-08-12 NOTE — Care Management Notes (Signed)
[facility] Management Note    Patient Name: [name] [name]  Date of Birth: 01/16/1961  Sex: male  Date/Time of Admission: 08/11/2019  5:44 PM  Room/Bed: 12/A  Payor: [place] MEDICARE / Plan: [place] MEDICARE ADVANTAGE PPO / Product Type: PPO /    LOS: 1 day   Primary Care Providers:  [facility] ([facility])    Admitting Diagnosis:  Abscess of left periorbital region [H05.012]       08/12/19 1406   CM Complete   Assessment Type Floor   Interventions needed? Yes   Chart Reviewed Yes   Patient status? Observation     Reviewed chart for Initial Assessment.     The patient will continue to be evaluated for developing discharge needs.     Case Manager: Genia Plants, RN  Phone: [phone]

## 2019-08-12 NOTE — Consults (Signed)
[facility]    [name] [name], Wierzbicki, 58 y.o. male  MRN: [id number]  Date of Birth:  April 12, 1961  Date of service: 08/12/2019  Hospital Day:  LOS: 1 day     XR FOOT LEFT performed on 08/12/2019 12:54 PM.    REASON FOR EXAM:  open wound, T2DM, rule out osteomyelitis    TECHNIQUE: 3 views/3 images submitted for interpretation.    COMPARISON:  None    IMPRESSION:  There is diffuse osteopenia limiting evaluation. There is a  deep soft tissue ulcer over the posterior calcaneus. There is erosive  change of the calcaneus concerning for osteomyelitis.      L foot x-ray results reviewed and showing erosive change of the calcaneus concerning for osteomyelitis.   Recommend Consult to podiatry for management.      Glean Hess, APRN,NP-C  08/12/2019, 15:27  [facility]  Ext. [phone]      The patient was seen independently by the APP  Unless directly noted, I did not evaluate the patient but was accessible by phone.  Signature required by employer    Roslyn Smiling, MD

## 2019-08-12 NOTE — Nurses Notes (Signed)
Son called and said his father previously lost decision making capacity and would like to have psych evaluate him again.  His home is full of maggots, has no windows, jugs of urine next to week old food and drug paraphernalnia.  States he just lays on couch and does not move for days doing drugs and drinking alcohol.  Son would like to talk to someone about trying to help his father.  Message sent to service awaiting response.

## 2019-08-12 NOTE — Progress Notes (Signed)
[employer]  Medicine Progress Note  Full Code    [name] [name]  Date of service: 08/12/2019    Subjective: No acute events overnight. He is tired with a mild headache but otherwise doing well. He denies fevers, chills, chest pain, shortness of breath, or abdominal pain. He last had a bowel movement a few days ago and this is normal for him. His last tetanus shot was 3 years. He states he will update his son but is agreeable to me talking to him if he calls with questions. He has no other concerns or complaints today.    Vital Signs:  Temp (24hrs) Max:38.6 C (809.9 F)      Systolic (83JAS), NKN:397 , Min:104 , QBH:419     Diastolic (37TKW), IOX:73, Min:71, Max:96    Temp  Avg: 37.4 C (99.4 F)  Min: 36.9 C (98.4 F)  Max: 38.6 C (101.4 F)  MAP (Non-Invasive)  Avg: 105.8 mmHG  Min: 82 mmHG  Max: 115 mmHG  Pulse  Avg: 92  Min: 78  Max: 102  Resp  Avg: 18.2  Min: 17  Max: 20  SpO2  Avg: 97 %  Min: 96 %  Max: 99 %  Pain Score (Numeric, Faces): 8  Fi02    I/O:  I/O last 24 hours:  No intake or output data in the 24 hours ending 08/12/19 0733  I/O current shift:  No intake/output data recorded.  Blood Sugars: Last Fingerstick:  No results found for: GLUCOSEPOC      .  acetaminophen (TYLENOL) tablet, 650 mg, Oral, Q4H PRN    .  cefepime (MAXIPIME) 2 g in NS 100 mL IVPB, 2 g, Intravenous, Q12H    .  gabapentin (NEURONTIN) capsule, 400 mg, Oral, 3x/day    .  heparin 5,000 unit/mL injection, 5,000 Units, Subcutaneous, Q8HRS    .  metroNIDAZOLE (FLAGYL) 500 mg in NS 100 mL premix IVPB, 500 mg, Intravenous, Q8H    .  NS flush syringe, 2-6 mL, Intracatheter, Q8HRS    And    .  NS flush syringe, 2-6 mL, Intracatheter, Q1 MIN PRN    .  NS premix infusion, , Intravenous, Continuous    .  oxyCODONE (ROXICODONE) immediate release tablet, 5 mg, Oral, Q4H PRN    .  oxyCODONE (ROXICODONE) immediate release tablet, 10 mg, Oral, Q4H PRN    .  SSIP insulin lispro (HUMALOG) 100 units/mL injection, 0-12 Units, Subcutaneous,  4x/day PRN    .  vancomycin (VANCOCIN) 1,750 mg in NS 500 mL IVPB, 20 mg/kg, Intravenous, Q24H    .  Vancomycin IV - Pharmacist to Dose per Protocol, , Does not apply, Daily PRN        No Known Allergies    Physical Exam:   Constitutional: appears in good health and no distress, resting in bed comfortably  Eyes: right eye pupil is equal and round, left eye is very swollen, erythematous and difficult to open, no vesicles  ENT: Mouth mucous membranes moist.   Neck: trachea midline  Respiratory: Clear to auscultation bilaterally without wheezes, rhonchi, or crackles.   Cardiovascular: regular rate and rhythm, S1, S2 normal, no murmur, click, rub or gallop. No s3/s4  Gastrointestinal: Soft, non-tender, Bowel sounds normal  Genitourinary: Deferred  Musculoskeletal: Head atraumatic and normocephalic  Integumentary:  Skin warm and dry and skin lesion over left eye/face, diabetic foot wounds on BL heels covered today  Neurologic: Alert and oriented x3, no focal deficits  Psychiatric: Affect  Normal  [place]: Eye opening: 4 spontaneous, Verbal resonse:  5 oriented, Best motor response:  6 obeys commands      Labs:  Lab Results Today:    Results for orders placed or performed during the hospital encounter of 08/11/19 (from the past 24 hour(s))   HEPATITIS C ANTIBODY SCREEN WITH REFLEX TO HCV PCR   Result Value Ref Range    HCV ANTIBODY QUALITATIVE Negative Negative   HIV1/HIV2 SCREEN, COMBINED ANTIGEN AND ANTIBODY   Result Value Ref Range    HIV SCREEN, COMBINED ANTIGEN & ANTIBODY Negative Negative   BASIC METABOLIC PANEL   Result Value Ref Range    SODIUM 138 136 - 145 mmol/L    POTASSIUM 3.8 3.5 - 5.1 mmol/L    CHLORIDE 105 96 - 111 mmol/L    CO2 TOTAL 20 (L) 22 - 32 mmol/L    ANION GAP 13 4 - 13 mmol/L    CALCIUM 9.3 8.5 - 10.2 mg/dL    GLUCOSE 78 65 - 139 mg/dL    BUN 23 8 - 25 mg/dL    CREATININE 1.43 (H) 0.62 - 1.27 mg/dL    BUN/CREA RATIO 16 6 - 22    ESTIMATED GFR 53 (L) >60 mL/min/1.13m^2   C-REACTIVE PROTEIN(CRP)      Result Value Ref Range    CRP INFLAMMATION 210.2 (H) <=8.0 mg/L   HEPATIC FUNCTION PANEL   Result Value Ref Range    ALBUMIN 2.5 (L) 3.5 - 5.0 g/dL    ALKALINE PHOSPHATASE 120 (H) 45 - 115 U/L    ALT (SGPT) 6 <55 U/L    AST (SGOT) 16 8 - 48 U/L    BILIRUBIN TOTAL 0.7 0.3 - 1.3 mg/dL    BILIRUBIN DIRECT 0.3 (H) <0.3 mg/dL    PROTEIN TOTAL 7.5 6.4 - 8.3 g/dL   MAGNESIUM   Result Value Ref Range    MAGNESIUM 1.8 1.6 - 2.6 mg/dL   CBC WITH DIFF   Result Value Ref Range    WBC 17.0 (H) 3.7 - 11.0 x10^3/uL    RBC 4.17 (L) 4.50 - 6.10 x10^6/uL    HGB 12.5 (L) 13.4 - 17.5 g/dL    HCT 37.0 (L) 38.9 - 52.0 %    MCV 88.7 78.0 - 100.0 fL    MCH 30.0 26.0 - 32.0 pg    MCHC 33.8 31.0 - 35.5 g/dL    RDW-CV 12.8 11.5 - 15.5 %    PLATELETS 256 150 - 400 x10^3/uL    MPV 10.5 8.7 - 12.5 fL    NEUTROPHIL % 82 %    LYMPHOCYTE % 8 %    MONOCYTE % 7 %    EOSINOPHIL % 1 %    BASOPHIL % 1 %    NEUTROPHIL # 13.92 (H) 1.50 - 7.70 x10^3/uL    LYMPHOCYTE # 1.40 1.00 - 4.80 x10^3/uL    MONOCYTE # 1.21 (H) 0.20 - 1.10 x10^3/uL    EOSINOPHIL # 0.22 <=0.50 x10^3/uL    BASOPHIL # 0.11 <=0.20 x10^3/uL    IMMATURE GRANULOCYTE % 1 0 - 1 %    IMMATURE GRANULOCYTE # 0.11 (H) <0.10 x10^3/uL   HGA1C (HEMOGLOBIN A1C WITH EST AVG GLUCOSE)   Result Value Ref Range    HEMOGLOBIN A1C 8.8 (H) 4.0 - 5.6 %    ESTIMATED AVERAGE GLUCOSE 206 mg/dL   ECG 12-LEAD   Result Value Ref Range    Ventricular rate 94 BPM    Atrial Rate 94 BPM  PR Interval 154 ms    QRS Duration 102 ms    QT Interval 378 ms    QTC Calculation 472 ms    Calculated P Axis 35 degrees    Calculated R Axis -80 degrees    Calculated T Axis 45 degrees   VENOUS BLOOD GAS/LACTATE   Result Value Ref Range    %FIO2 (VENOUS) 21.0 %    PH (VENOUS) 7.39 7.31 - 7.41    PCO2 (VENOUS) 41.00 41.00 - 51.00 mm/Hg    PO2 (VENOUS) 32.0 (L) 35.0 - 50.0 mm/Hg    BASE DEFICIT 0.2 -3.0 - 3.0 mmol/L    BICARBONATE (VENOUS) 23.8 22.0 - 26.0 mmol/L    LACTATE 1.1 0.0 - 1.3 mmol/L   VANCOMYCIN, RANDOM   Result Value  Ref Range    VANCOMYCIN RANDOM 7.3   ug/mL    DOSE DATE 08/11/2019     DOSE TIME  6:00 AM    STERILE SITE CULTURE AND GRAM STAIN, AEROBIC    Specimen: Abscess; Other   Result Value Ref Range    GRAM STAIN 4+ Many PMNs (A)     GRAM STAIN 3+ Several Gram Positive Cocci (A)    SODIUM, RANDOM URINE   Result Value Ref Range    SODIUM RANDOM URINE 65 No Reference Range Established mmol/L   CREATININE URINE, RANDOM   Result Value Ref Range    CREATININE RANDOM URINE 82 No Reference Range Established mg/dL   STREPTOCOCCUS PNEUMONIAE ANTIGEN,URINE    Specimen: Urine, Site not specified   Result Value Ref Range    S.PNEUMONIA ANTIGEN Negative Negative, Indeterminate   LEGIONELLA URINE ANTIGEN    Specimen: Urine, Site not specified   Result Value Ref Range    LEGIONELLA ANTIGEN Negative Negative, Indeterminate   URINALYSIS, MACROSCOPIC   Result Value Ref Range    SPECIFIC GRAVITY 1.014 1.005 - 1.030    GLUCOSE Negative Negative mg/dL    PROTEIN > 500 (A) Negative mg/dL    BILIRUBIN Negative Negative mg/dL    UROBILINOGEN Negative Negative mg/dL    PH 5.0 5.0 - 8.0    BLOOD Small (A) Negative mg/dL    KETONES Negative Negative mg/dL    NITRITE Negative Negative    LEUKOCYTES Negative Negative WBCs/uL    APPEARANCE Clear Clear    COLOR Normal (Yellow) Normal (Yellow)   URINALYSIS, MICROSCOPIC   Result Value Ref Range    WBCS 1.0 <4.0 /hpf    RBCS 4.0 <6.0 /hpf    BACTERIA Occasional or less Occasional or less /hpf    MUCOUS Light Light /lpf   POC BLOOD GLUCOSE (RESULTS)   Result Value Ref Range    GLUCOSE, POC 217 (H) 70 - 105 mg/dl   MAGNESIUM   Result Value Ref Range    MAGNESIUM 1.7 1.6 - 2.6 mg/dL   BASIC METABOLIC PANEL   Result Value Ref Range    SODIUM 136 136 - 145 mmol/L    POTASSIUM 4.4 3.5 - 5.1 mmol/L    CHLORIDE 108 96 - 111 mmol/L    CO2 TOTAL 19 (L) 22 - 32 mmol/L    ANION GAP 9 4 - 13 mmol/L    CALCIUM 8.6 8.5 - 10.2 mg/dL    GLUCOSE 185 (H) 65 - 139 mg/dL    BUN 21 8 - 25 mg/dL    CREATININE 1.45 (H) 0.62 - 1.27  mg/dL    BUN/CREA RATIO 14 6 - 22    ESTIMATED GFR 52 (  L) >60 mL/min/1.57m^2   PHOSPHORUS   Result Value Ref Range    PHOSPHORUS 3.0 2.4 - 4.7 mg/dL   HEPATIC FUNCTION PANEL   Result Value Ref Range    ALBUMIN 2.1 (L) 3.5 - 5.0 g/dL    ALKALINE PHOSPHATASE 98 45 - 115 U/L    ALT (SGPT) 5 <55 U/L    AST (SGOT) 12 8 - 48 U/L    BILIRUBIN TOTAL 0.4 0.3 - 1.3 mg/dL    BILIRUBIN DIRECT 0.2 <0.3 mg/dL    PROTEIN TOTAL 6.3 (L) 6.4 - 8.3 g/dL   CBC WITH DIFF   Result Value Ref Range    WBC 12.2 (H) 3.7 - 11.0 x10^3/uL    RBC 3.75 (L) 4.50 - 6.10 x10^6/uL    HGB 11.3 (L) 13.4 - 17.5 g/dL    HCT 33.8 (L) 38.9 - 52.0 %    MCV 90.1 78.0 - 100.0 fL    MCH 30.1 26.0 - 32.0 pg    MCHC 33.4 31.0 - 35.5 g/dL    RDW-CV 12.8 11.5 - 15.5 %    PLATELETS 184 150 - 400 x10^3/uL    MPV 10.4 8.7 - 12.5 fL    NEUTROPHIL % 78 %    LYMPHOCYTE % 12 %    MONOCYTE % 6 %    EOSINOPHIL % 2 %    BASOPHIL % 1 %    NEUTROPHIL # 9.49 (H) 1.50 - 7.70 x10^3/uL    LYMPHOCYTE # 1.46 1.00 - 4.80 x10^3/uL    MONOCYTE # 0.78 0.20 - 1.10 x10^3/uL    EOSINOPHIL # 0.28 <=0.50 x10^3/uL    BASOPHIL # <0.10 <=0.20 x10^3/uL    IMMATURE GRANULOCYTE % 1 0 - 1 %    IMMATURE GRANULOCYTE # <0.10 <0.10 x10^3/uL   POC BLOOD GLUCOSE (RESULTS)   Result Value Ref Range    GLUCOSE, POC 188 (H) 70 - 105 mg/dl       Radiology:    CXR 12/3:  IMPRESSION:  Shallow inspiration with left basilar atelectasis. Right apical opacity is  indeterminate and could represent consolidation or superimposition of a  skin fold.      Microbiology:  urine streptococcus and legionella antigen negative, blood cultures pending, sterile site culture 4+ PMNs and 3+ GPC    PT/OT: Yes    Consults: Ophthalmology    Hardware (lines, foley's, tubes): PIV    Assessment/ Plan:   [name] [name] is a 58 year old male with a PMH of insulin dependent type 2 DM who presents as a transfer from and outside facility for periorbital abscess requiring drainage and IV antibiotics. No longer febrile or tachycardic on  current IV antibiotics and is otherwise stable on floor status.  Active Hospital Problems    Diagnosis   . Preseptal cellulitis of left eye   . Sepsis due to methicillin resistant Staphylococcus aureus (MRSA) without acute organ dysfunction (CMS HCC)   . Abscess of left periorbital region     [facility]: I believe patient has [place] at this time as he is alert and oriented and understands his clinical course and treatment options.    Leukocytosis with Left Shift-Improving, Hyperthermia-Resolved  Preseptal Cellulitis with Abscess growing MRSA at outside facility  -Normothermic, not tachycardic  -CRP 210, pending repeat  -Vancomycin, Cefepime, Flagyl (given possible concomitant DM foot infection)                -Was on vancomycin and ceftriaxone at outside facility   -Got 1L in  ED and now on 65mL/hour NS  -Blood cultures pending; urine without concern for infection, CXR possible consolidation versus skin fold will repeat in AM as lung examination is WNL   -Cultures from outside facility was clindamycin resistant and vancomycin sensitive  -Ophthalmology performed I&D at bedside and are following (appreciate assistance)                -No need for OR or CT today                -Ophthalmology states EOMI and not concerning for septal involvement at this  time.                -Further evaluation for glaucoma, right eye nevus, and NPDR as outpatient    AKI  -No known CKD history but baseline creatinine unknown, was 1.9 at one point during admission at outside facility  -FeNa indicating pre-renal  -Got 1L IVF in ED; will continue 57mL/hour NS with PO intake for 24 hours total and re-assess in AM  -Will continue to monitor     DM Foot Wound with Osteomyelitis  -Xray concerning for osteomyelitis  -Consulting podiatry  -On vancomycin, cefepime, flagyl  -Wound care consulted and appreciate recommendations   -Left foot Xray to rule out osteomyelitis   -Podiatry follow up as outpatient for DM shoe fitting   -MNT consult    Insulin  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  I reviewed the resident's note.  I agree with the findings and plan of care as documented in the resident's note.  Any exceptions/additions are edited/noted.  Tolerating Abx  Appears to have [place] - able to discuss medical issue and planned tx  Xray foot s/o osteo - will consult podiatry  Shane Crutch., MD

## 2019-08-12 NOTE — Ancillary Notes (Cosign Needed)
[name] [name]   Gender: male   DOB: Aug 31, 1961   MRN: [id number]    Diabetes Education    Stopped to assess diabetes education needs. The patient states that he has had diabetes for about 20 years. The patient states that he takes Lantus, and Humalog.  The patient verbalized understanding and compliance with medication regimen. However he states the amount of Lantus depends on his blood glucose level. Also states he uses sliding scale for Humalog, but is only testing once daily. The patient stated that he has a meter and tests glucose once a  a day at home (in the afternoon). The patient states that he  ranges around 225 mg/dL. Current A1c 8.8. The patient denies issues with obtaining testing supplies or medications. We reviewed hypoglycemia; reports rare episodes. We reviewed progression of disease, ADA BG targets and the importance of testing and avoidance of complications by managing blood sugars. The patient shared that he follows with [facility] for diabetes. Answered all questions. Verbalized understanding. Pt did not ask any questions related to his diabetes. Contact information offered.    Melford Aase, DI    Candy Sledge, [place], MPH  Diabetes Education [employer] [phone] / [phone]

## 2019-08-12 NOTE — Consults (Addendum)
[facility]  OPHTHALMOLOGY Consult Follow Up    [name], [name], 58 y.o. male  Date of Birth:  [birth year]  Date of Service:  08/12/2019    F/U evalutation of:  Preseptal cellulitis, left eye    Subjective:  Patient reports no vision changes overnight. Specifically denies pain with EOM, pain behind his left eye, or increased blurry vision.    Data:    Mood and Affect Normal:  Yes  Mental Status - Oriented to person, place, and time:  Yes    Visual Acuity:  sc  cc  ph  near    OD  +   20/50 (poor effort)      OS  +   20/50 (poor effort)         Intraocular Pressure: by [place]  OD:  18     OS:  24      Ocular Motility and Alignment:    normal OU    Visual fields by confrontation:    Normal OU     OD OS   Adnexa WNL Edema with mild erythema of upper lid with swelling superior to the brow. Purulent material actively draining superior to left brow bandage in place over abscess incision   Cornea WNL WNL, mucous on surface   Anterior Chamber WNL WNL   Iris/pupils   WNL WNL   APD No No   Lens 2+ NS 2+ NS     Pupil Dilation:  not done     Labs/Images:  Cr 1.45    A/P:   58 y.o. who presented with extensive preseptal cellulitis now s/p abscess drainage. 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 IOP acceptable, no RAPD, color plates near full.     Recommendations:  -Gram stain from abscess with 3+ gram positive cocci, continuing to monitor  -Outside culture with clindamycin-resistant MRSA per verbal report; plan to follow outside culture at [facility] and culture result obtained here 08/11/19.   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  I reviewed the  resident's note.  I agree with the findings and plan of care as documented in the resident's note.  Any exceptions/additions are edited/noted.  Delia Chimes, MD 08/12/2019, 07:31      Late entry for 08/12/19. I saw and examined the patient.  I reviewed the resident's note.  I agree with the findings and plan of care as documented in the resident's note.  Any exceptions/additions are edited/noted.    Veto Kemps, MD

## 2019-08-12 NOTE — Transitional Care (Signed)
[facility]   Transitional Care Coordination   Initial Assessment       Name: [name] [name]   Date of Birth: [birth year] 58 y.o.  Date of service: 08/12/2019  Lay Caregiver:  ,  ,         1. Patient appointment preferences: None  2. Transportation to healthcare appointments: Family transports  3. PCP listed as [facility]. This is correct per [name] [name]. Obtained verbal consent from [place] to contact PCP.   4. Patient MyChart status: Inactive  5. Preferred method of contact: Mobile patient's son [name] [name]   Confirmed patient contact information:   Home Phone [phone]   Work Phone [phone]   Mobile [phone]       Spoke with patient's so [name] [name] to discuss provider of primary care services.  Discussed role of Transitional Care Coordination Team and informed of contact within 2 business days of discharge to assess follow-up plan.    Discussed with patient that follow up appointment may be a telephone encounter or MyChart video visit instead of a clinical appointment due to current pandemic. Patient MyChart status is inacive. MyChart registration link sent: N/A- patient refused    [employer] states they do not have access to a device with video capabilities.      [name] [name]  08/12/2019, 13:27

## 2019-08-12 NOTE — Consults (Signed)
[place]  Date of Service: 08/12/2019  Encounter Start Date:  08/11/2019  Inpatient Admission Date:  08/11/2019    Service: Medicine 5  Requesting MD: Leanor Kail, MD  Chief Complaint:  Ulceration bilateral heels with questionable OM on x-ray of the left    Assessment/Recommendations:   1. Ulceration left heel and preulcerative lesion right heel  2. 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  3. Continue collagen dressings per wound care nurse to left heel  4. No surgical intervention needed at this time  5. Nuc Med infection scan ordered to assess extent of OM left calcaneus.  6. Insulin-dependent diabetes with diabetic neuropathy.    HPI/Discussion:  This 58 y.o. male who presents with bilateral heel wounds with the right being resolved but he still has a heel wound on the left.  Patient states he has had this for several months maybe even longer.  He has had to 6 week course of antibiotics in the past her foot he is uncertain when the last was.  He feels as though the left heel is getting much better and is doing well.  He regularly sees a wound care doctor for this.  He has been in the hospital because of this left heel.  He is currently admitted to room or hospital for orbital abscess.           Patient Active Problem List    Diagnosis Date Noted   . Preseptal cellulitis of left eye 08/12/2019   . Sepsis due to methicillin resistant Staphylococcus aureus (MRSA) without acute organ dysfunction (CMS HCC) 08/12/2019   . Abscess of left periorbital region 08/11/2019     Past Medical History:   Diagnosis Date   . Diabetes mellitus, type 2 (CMS HCC)    . MRSA (methicillin resistant staph aureus) culture positive 08/06/2019    L eye cellulitis         Past Surgical History:   Procedure Laterality Date   . MANDIBLE SURGERY             Inpatient Medications:    .  acetaminophen (TYLENOL) tablet, 650 mg, Oral, Q4H PRN    .  cefepime (MAXIPIME) 2 g in NS 100 mL IVPB,  2 g, Intravenous, Q12H    .  gabapentin (NEURONTIN) capsule, 400 mg, Oral, 3x/day    .  heparin 5,000 unit/mL injection, 5,000 Units, Subcutaneous, Q8HRS    .  metroNIDAZOLE (FLAGYL) tablet, 500 mg, Oral, 3x/day    .  NS flush syringe, 2-6 mL, Intracatheter, Q8HRS    And    .  NS flush syringe, 2-6 mL, Intracatheter, Q1 MIN PRN    .  oxyCODONE (ROXICODONE) immediate release tablet, 5 mg, Oral, Q4H PRN    .  oxyCODONE (ROXICODONE) immediate release tablet, 10 mg, Oral, Q4H PRN    .  polyethylene glycol (MIRALAX) oral packet, 17 g, Oral, Daily    .  sennosides-docusate sodium (SENOKOT-S) 8.6-50mg  per tablet, 1 Tab, Oral, 2x/day PRN    .  SSIP insulin lispro (HUMALOG) 100 units/mL injection, 0-12 Units, Subcutaneous, 4x/day PRN    .  vancomycin (VANCOCIN) 1,750 mg in NS 500 mL IVPB, 20 mg/kg, Intravenous, Q24H    .  Vancomycin IV - Pharmacist to Dose per Protocol, , Does not apply, Daily PRN      No Known Allergies  Family History  Reviewed in epic    Social  History  Social History     Socioeconomic History   . Marital status: Widowed     Spouse name: Not on file   . Number of children: Not on file   . Years of education: Not on file   . Highest education level: Not on file   Occupational History   . Not on file   Social Needs   . Financial resource strain: Not on file   . Food insecurity     Worry: Not on file     Inability: Not on file   . Transportation needs     Medical: Not on file     Non-medical: Not on file   Tobacco Use   . Smoking status: Former Smoker     Types: Cigars   . Smokeless tobacco: Never Used   Substance and Sexual Activity   . Alcohol use: Yes     Comment: occasionally   . Drug use: Never   . Sexual activity: Not on file   Lifestyle   . Physical activity     Days per week: Not on file     Minutes per session: Not on file   . Stress: Not on file   Relationships   . Social Product manager on phone: Not on file     Gets together: Not on file     Attends religious service: Not on file     Active  member of club or organization: Not on file     Attends meetings of clubs or organizations: Not on file     Relationship status: Not on file   . Intimate partner violence     Fear of current or ex partner: Not on file     Emotionally abused: Not on file     Physically abused: Not on file     Forced sexual activity: Not on file   Other Topics Concern   . Not on file   Social History Narrative   . Not on file       ROS: Other than ROS in the HPI, all other systems were negative.    Examination:  Temperature: 36.7 C (98.1 F)  Heart Rate: 72  BP (Non-Invasive): 134/80  Respiratory Rate: 17  SpO2: 97 %  Pain Score (Numeric, Faces): 8  Height: 181.6 cm (5' 11.5")  Weight: 84.4 kg (186 lb 1.1 oz)    General:   appears chronically ill and morbidly obese  Vascular exam   Dorsalis pedal pulse B/L -+2/4, palpable  Posterior tibial pulse B/L -+2/4, palpable  Skin temperature  B/L -warm to cool  Capillary refill  B/L All digits less than or equal to 5 seconds  Hair growth  B/L felt absent  Edema:   present - non-pitting B/L lower extremities+1  Trophic changes: thin skin  Dermatologic exam  Wounds: Wound 1: left heel size 4.9 by 3.0 by 0.1 cm, base: Red and beefy, periphery: hyperkeratotic, odor: no odor, discharge: serosanginous, tracking  -, undermining +, signs of infection: no signs of infection, depth to subcutaneous tissue, probes to does not probe  Neurologic exam  Gross sensation absent: foot on the bilateral  Orthopedic exam  Patient does have absent Achilles tendon function on this left foot.  There does appear to be a palpable gap in the Achilles tendon.  With appearance of the calcaneus he has lost the majority of his fat pad on the left.  Right heel shows purple thin discolored  epidermis however there is no opening at this time.        Labs:    Lab Results Today:    Results for orders placed or performed during the hospital encounter of 08/11/19 (from the past 24 hour(s))   MAGNESIUM   Result Value Ref Range     MAGNESIUM 1.7 1.6 - 2.6 mg/dL   BASIC METABOLIC PANEL   Result Value Ref Range    SODIUM 136 136 - 145 mmol/L    POTASSIUM 4.4 3.5 - 5.1 mmol/L    CHLORIDE 108 96 - 111 mmol/L    CO2 TOTAL 19 (L) 22 - 32 mmol/L    ANION GAP 9 4 - 13 mmol/L    CALCIUM 8.6 8.5 - 10.2 mg/dL    GLUCOSE 185 (H) 65 - 139 mg/dL    BUN 21 8 - 25 mg/dL    CREATININE 1.45 (H) 0.62 - 1.27 mg/dL    BUN/CREA RATIO 14 6 - 22    ESTIMATED GFR 52 (L) >60 mL/min/1.64m^2   PHOSPHORUS   Result Value Ref Range    PHOSPHORUS 3.0 2.4 - 4.7 mg/dL   HEPATIC FUNCTION PANEL   Result Value Ref Range    ALBUMIN 2.1 (L) 3.5 - 5.0 g/dL    ALKALINE PHOSPHATASE 98 45 - 115 U/L    ALT (SGPT) 5 <55 U/L    AST (SGOT) 12 8 - 48 U/L    BILIRUBIN TOTAL 0.4 0.3 - 1.3 mg/dL    BILIRUBIN DIRECT 0.2 <0.3 mg/dL    PROTEIN TOTAL 6.3 (L) 6.4 - 8.3 g/dL   CBC WITH DIFF   Result Value Ref Range    WBC 12.2 (H) 3.7 - 11.0 x10^3/uL    RBC 3.75 (L) 4.50 - 6.10 x10^6/uL    HGB 11.3 (L) 13.4 - 17.5 g/dL    HCT 33.8 (L) 38.9 - 52.0 %    MCV 90.1 78.0 - 100.0 fL    MCH 30.1 26.0 - 32.0 pg    MCHC 33.4 31.0 - 35.5 g/dL    RDW-CV 12.8 11.5 - 15.5 %    PLATELETS 184 150 - 400 x10^3/uL    MPV 10.4 8.7 - 12.5 fL    NEUTROPHIL % 78 %    LYMPHOCYTE % 12 %    MONOCYTE % 6 %    EOSINOPHIL % 2 %    BASOPHIL % 1 %    NEUTROPHIL # 9.49 (H) 1.50 - 7.70 x10^3/uL    LYMPHOCYTE # 1.46 1.00 - 4.80 x10^3/uL    MONOCYTE # 0.78 0.20 - 1.10 x10^3/uL    EOSINOPHIL # 0.28 <=0.50 x10^3/uL    BASOPHIL # <0.10 <=0.20 x10^3/uL    IMMATURE GRANULOCYTE % 1 0 - 1 %    IMMATURE GRANULOCYTE # <0.10 <0.10 x10^3/uL   POC BLOOD GLUCOSE (RESULTS)   Result Value Ref Range    GLUCOSE, POC 188 (H) 70 - 105 mg/dl   POC BLOOD GLUCOSE (RESULTS)   Result Value Ref Range    GLUCOSE, POC 167 (H) 70 - 105 mg/dl   C-REACTIVE PROTEIN(CRP),INFLAMMATION   Result Value Ref Range    CRP INFLAMMATION 154.1 (H) <=8.0 mg/L   POC BLOOD GLUCOSE (RESULTS)   Result Value Ref Range    GLUCOSE, POC 295 (H) 70 - 105 mg/dl   POC BLOOD GLUCOSE  (RESULTS)   Result Value Ref Range    GLUCOSE, POC 139 (H) 70 - 105 mg/dl       Imaging Studies:  Karlene Lineman  Male, 58 years old.    XR FOOT LEFT performed on 08/12/2019 12:54 PM.    REASON FOR EXAM:  open wound, T2DM, rule out osteomyelitis    TECHNIQUE: 3 views/3 images submitted for interpretation.    COMPARISON:  None    IMPRESSION:  There is diffuse osteopenia limiting evaluation. There is a  deep soft tissue ulcer over the posterior calcaneus. There is erosive  change of the calcaneus concerning for osteomyelitis.      Clarise Cruz, DPM  08/13/2019, 00:08

## 2019-08-12 NOTE — Nurses Notes (Signed)
Patient's son, Charlton Boule called to express his concerns for his father's well being and living condition. Adam stated that he is the patient's emergency contact (phone number [phone]) and he would like to be contacted and kept up to date on his fathers plan of care.

## 2019-08-12 NOTE — Nurses Notes (Signed)
I called Adam, son and requested that he send previous paperwork that was completed on his father regarding his lack of decision making capacity.  Fax number provided.

## 2019-08-13 ENCOUNTER — Inpatient Hospital Stay (HOSPITAL_COMMUNITY): Payer: Medicare PPO

## 2019-08-13 ENCOUNTER — Inpatient Hospital Stay (HOSPITAL_BASED_OUTPATIENT_CLINIC_OR_DEPARTMENT_OTHER)
Admission: RE | Admit: 2019-08-13 | Discharge: 2019-08-13 | Disposition: A | Discharge status: Home / Self Care | Payer: Medicare PPO | Source: Ambulatory Visit

## 2019-08-13 DIAGNOSIS — J849 Interstitial pulmonary disease, unspecified: Secondary | ICD-10-CM

## 2019-08-13 DIAGNOSIS — M86172 Other acute osteomyelitis, left ankle and foot: Secondary | ICD-10-CM

## 2019-08-13 DIAGNOSIS — K5900 Constipation, unspecified: Secondary | ICD-10-CM

## 2019-08-13 LAB — CBC WITH DIFF
BASOPHIL #: <0.1 10*3/uL (ref <=0.20)
BASOPHIL %: 1 %
EOSINOPHIL #: 0.27 10*3/uL (ref <=0.50)
EOSINOPHIL %: 4 %
HCT: 31 % — ABNORMAL LOW (ref 38.9–52.0)
HGB: 10.5 g/dL — ABNORMAL LOW (ref 13.4–17.5)
IMMATURE GRANULOCYTE #: <0.1 10*3/uL (ref <0.10)
IMMATURE GRANULOCYTE %: 0 % (ref 0–1)
LYMPHOCYTE #: 1.05 10*3/uL (ref 1.00–4.80)
LYMPHOCYTE %: 16 %
MCH: 30.6 pg (ref 26.0–32.0)
MCHC: 33.9 g/dL (ref 31.0–35.5)
MCV: 90.4 fL (ref 78.0–100.0)
MONOCYTE #: 0.53 10*3/uL (ref 0.20–1.10)
MONOCYTE %: 8 %
MPV: 11.1 fL (ref 8.7–12.5)
NEUTROPHIL #: 4.79 10*3/uL (ref 1.50–7.70)
NEUTROPHIL %: 71 %
PLATELETS: 202 10*3/uL (ref 150–400)
RBC: 3.43 10*6/uL — ABNORMAL LOW (ref 4.50–6.10)
RDW-CV: 12.9 % (ref 11.5–15.5)
WBC: 6.7 10*3/uL (ref 3.7–11.0)

## 2019-08-13 LAB — RESPIRATORY VIRUS PANEL BY BIOFIRE FILM ARRAY
ADENOVIRUS ARRAY: NOT DETECTED
BORDETELLA PERTUSSIS ARRAY: NOT DETECTED
CHLAMYDOPHILA PNEUMONIAE ARRAY: NOT DETECTED
CORONAVIRUS 229E: NOT DETECTED
CORONAVIRUS HKU1: NOT DETECTED
CORONAVIRUS NL63: NOT DETECTED
CORONAVIRUS OC43: NOT DETECTED
INFLUENZA A: NOT DETECTED
INFLUENZA B ARRAY: NOT DETECTED
METAPNEUMOVIRUS ARRAY: NOT DETECTED
MYCOPLASMA PNEUMONIAE ARRAY: NOT DETECTED
PARAINFLUENZA 1 ARRAY: NOT DETECTED
PARAINFLUENZA 2 ARRAY: NOT DETECTED
PARAINFLUENZA 3 ARRAY: NOT DETECTED
PARAINFLUENZA 4 ARRAY: NOT DETECTED
RHINOVIRUS/ENTEROVIRUS ARRAY: NOT DETECTED
RSV ARRAY: NOT DETECTED

## 2019-08-13 LAB — POC BLOOD GLUCOSE (RESULTS)
GLUCOSE, POC: 146 mg/dL — ABNORMAL HIGH (ref 70–105)
GLUCOSE, POC: 216 mg/dL — ABNORMAL HIGH (ref 70–105)
GLUCOSE, POC: 189 mg/dL — ABNORMAL HIGH (ref 70–105)
GLUCOSE, POC: 181 mg/dL — ABNORMAL HIGH (ref 70–105)

## 2019-08-13 LAB — MAGNESIUM: MAGNESIUM: 2 mg/dL (ref 1.6–2.6)

## 2019-08-13 LAB — BASIC METABOLIC PANEL
ANION GAP: 8 mmol/L (ref 4–13)
BUN/CREA RATIO: 16 (ref 6–22)
BUN: 19 mg/dL (ref 8–25)
CALCIUM: 8.3 mg/dL — ABNORMAL LOW (ref 8.5–10.2)
CHLORIDE: 109 mmol/L (ref 96–111)
CO2 TOTAL: 21 mmol/L — ABNORMAL LOW (ref 22–32)
CREATININE: 1.18 mg/dL (ref 0.62–1.27)
ESTIMATED GFR: >60 mL/min/{1.73_m2} (ref >60)
GLUCOSE: 150 mg/dL — ABNORMAL HIGH (ref 65–139)
POTASSIUM: 4.2 mmol/L (ref 3.5–5.1)
SODIUM: 138 mmol/L (ref 136–145)

## 2019-08-13 LAB — VANCOMYCIN, TROUGH: VANCOMYCIN TROUGH: 19.7 ug/mL (ref 10.0–20.0)

## 2019-08-13 LAB — PROCALCITONIN ALGORITHM: PROCALCITONIN: 0.18 ng/mL (ref <0.50)

## 2019-08-13 LAB — IONIZED CALCIUM WITH PH
IONIZED CALCIUM: 1.25 mmol/L (ref 1.10–1.35)
PH (VENOUS): 7.38 (ref 7.31–7.41)

## 2019-08-13 LAB — C-REACTIVE PROTEIN(CRP),INFLAMMATION: CRP INFLAMMATION: 97.5 mg/L — ABNORMAL HIGH (ref <=8.0)

## 2019-08-13 LAB — PHOSPHORUS: PHOSPHORUS: 3 mg/dL (ref 2.4–4.7)

## 2019-08-13 LAB — CREATINE KINASE (CK), TOTAL, SERUM OR PLASMA: CREATINE KINASE: 17 U/L — ABNORMAL LOW (ref 45–225)

## 2019-08-13 MED ORDER — INSULIN GLARGINE (U-100) 100 UNIT/ML SUBCUTANEOUS SOLUTION
3.00 [IU] | Freq: Every evening | SUBCUTANEOUS | Status: DC
Start: 2019-08-13 — End: 2019-08-14
  Administered 2019-08-13: 3 [IU] via SUBCUTANEOUS
  Filled 2019-08-13 (×2): qty 3

## 2019-08-13 MED ORDER — HYDROXYZINE PAMOATE 25 MG CAPSULE
25.0000 mg | ORAL_CAPSULE | Freq: Once | ORAL | Status: AC
Start: 2019-08-14 — End: 2019-08-13
  Administered 2019-08-13: 25 mg via ORAL
  Filled 2019-08-13: qty 1

## 2019-08-13 MED ORDER — IPRATROPIUM 0.5 MG-ALBUTEROL 3 MG (2.5 MG BASE)/3 ML NEBULIZATION SOLN
3.0000 mL | INHALATION_SOLUTION | Freq: Four times a day (QID) | RESPIRATORY_TRACT | Status: DC
Start: 2019-08-13 — End: 2019-08-13

## 2019-08-13 MED ORDER — INSULIN LISPRO (U-100) 100 UNIT/ML SUBCUTANEOUS SOLUTION
1.00 [IU] | Freq: Three times a day (TID) | SUBCUTANEOUS | Status: DC
Start: 2019-08-13 — End: 2019-08-14
  Administered 2019-08-13 – 2019-08-14 (×2): 1 [IU] via SUBCUTANEOUS
  Filled 2019-08-13: qty 3

## 2019-08-13 MED ORDER — DOXYCYCLINE MONOHYDRATE 100 MG TABLET
100.0000 mg | ORAL_TABLET | Freq: Two times a day (BID) | ORAL | Status: DC
Start: 2019-08-13 — End: 2019-08-16
  Administered 2019-08-13 – 2019-08-16 (×6): 100 mg via ORAL
  Filled 2019-08-13 (×6): qty 1

## 2019-08-13 MED ORDER — SENNOSIDES 8.6 MG-DOCUSATE SODIUM 50 MG TABLET
1.00 | ORAL_TABLET | Freq: Two times a day (BID) | ORAL | Status: DC
Start: 2019-08-13 — End: 2019-08-17
  Administered 2019-08-13 – 2019-08-15 (×5): 1 via ORAL
  Administered 2019-08-15: 0 via ORAL
  Administered 2019-08-16 – 2019-08-17 (×3): 1 via ORAL
  Filled 2019-08-13 (×8): qty 1

## 2019-08-13 MED ORDER — HYDROXYZINE PAMOATE 25 MG CAPSULE
25.0000 mg | ORAL_CAPSULE | ORAL | Status: AC
Start: 2019-08-13 — End: 2019-08-13
  Administered 2019-08-13: 25 mg via ORAL
  Filled 2019-08-13: qty 1

## 2019-08-13 MED ORDER — SODIUM CHLORIDE 0.9 % INTRAVENOUS SOLUTION
15.00 mg/kg | INTRAVENOUS | Status: DC
Start: 2019-08-15 — End: 2019-08-15
  Administered 2019-08-15: 07:00:00 0 mg via INTRAVENOUS
  Administered 2019-08-15: 1250 mg via INTRAVENOUS
  Filled 2019-08-13: qty 12.5

## 2019-08-13 NOTE — Procedures (Addendum)
Date of Service:  08/12/2019    Name:  Mosie Angus  Chart#:  [id number]  DOB:  07-27-1961    MINOR ROOM OPERATIVE REPORT  Informed Consent Obtained?  Yes  Surgical Pause:  Yes Time:  10:53  Prep:  Betadine      Procedure Note  Pre-op Diagnosis:      ICD-10-CM    1. Abscess of left periorbital region  H05.012    2. Preseptal cellulitis of left eye  L03.213    3. Sepsis due to methicillin resistant Staphylococcus aureus (MRSA) without acute organ dysfunction (CMS HCC)  A41.02      Post-Op Diagnosis:      ICD-10-CM    1. Abscess of left periorbital region  H05.012    2. Preseptal cellulitis of left eye  L03.213    3. Sepsis due to methicillin resistant Staphylococcus aureus (MRSA) without acute organ dysfunction (CMS HCC)  A41.02      Procedure:  I&D of abscess L orbit  Faculty Surgeon:  Veto Kemps, MD  Fellow: Delia Chimes, MD PhD  Complications:  None  Post-Op Ointment/Drop:  Erythromycin oitment     Veto Kemps, MD 08/13/2019, 10:53    DESCRIPTION OF PROCEDURE:  After obtaining informed consent, the patient was brought to the operating suite and laid supine on the operating table.  2% lidocaine with 1:200,000 dilution of epinephrine were infiltrated into the area of the abscess.  The face was prepped and draped in a sterile ophthalmic fashion.     A curved hemostat was used to open the eschar on the LUL.  Dissection was carried toward the trochlear where a large amount of fluctuant was encountred and drained.  The loculations were broken and connected to another pocket of abscess toward the forehead.  Packing with betadine was placed.  Ointment and patching was applied.    Post-Procedure Note  Bleeding:    without any bleeding  Patient is stable, and ready for discharge from the clinic.  Follow-up in 2 weeks, sooner PRN     Veto Kemps, MD 08/13/2019, 10:53    I was present and supervised/observed the entire procedure.  Veto Kemps, MD 08/13/2019, 10:53

## 2019-08-13 NOTE — Ancillary Notes (Signed)
Diabetes Education    Stopped to follow up. Patient out of room. Will continue to follow.    Candy Sledge, RD, MPH  [facility]  Phone [phone] / [phone]

## 2019-08-13 NOTE — Nurses Notes (Signed)
Patient returned to unit. 

## 2019-08-13 NOTE — Care Plan (Signed)
Physical Therapy    Attempted to see pt for PT evaluation this p.m.  He adamantly refused to participate, despite encouragement from therapist.  Will follow for PT eval as pt is agreeable.    Elveria Rising, PT, MPT  Pager [phone]

## 2019-08-13 NOTE — Nurses Notes (Signed)
Patient leaving unit for scan.

## 2019-08-13 NOTE — Care Plan (Signed)
Assumed care of patient at shift change. With night shift RN- identification bracelet was scanned, code status confirmed, and white board updated. Patient lying in bed watching television. Patient verbalized complaints of pain, PRN medication administered per MAR. Assessment charted per flow sheet. PIV is easily flushed and and infusing IV ABX. Morning medication administered per MAR. Changed both heels dressing per Kardex instructions. Rooke Boots on. Plan of care reviewed with patient concerning IV ABX and wound care dressing changes. Patient agreeable to plan of care and verbalized understanding. Vital signs monitored. Call bell and personal belongings within reach. Hourly rounding performed by RN and CA to meet patient's needs. High fall and skin precautions maintained. Sitter select is on. Will continue to monitor.  Problem: Adult Inpatient Plan of Care  Goal: Plan of Care Review  Outcome: Ongoing (see interventions/notes)  Goal: Patient-Specific Goal (Individualized)  Outcome: Ongoing (see interventions/notes)  Goal: Absence of Hospital-Acquired Illness or Injury  Outcome: Ongoing (see interventions/notes)  Goal: Optimal Comfort and Wellbeing  Outcome: Ongoing (see interventions/notes)  Goal: Rounds/Family Conference  Outcome: Ongoing (see interventions/notes)     Problem: Depression  Goal: Improved Mood  Outcome: Ongoing (see interventions/notes)     Problem: Fall Injury Risk  Goal: Absence of Fall and Fall-Related Injury  Outcome: Ongoing (see interventions/notes)     Problem: Wound  Goal: Optimal Wound Healing  Outcome: Ongoing (see interventions/notes)     Problem: Infection  Goal: Infection Symptom Resolution  Outcome: Ongoing (see interventions/notes)     Problem: Mobility Impairment  Goal: Optimal Mobility  Outcome: Ongoing (see interventions/notes)     Problem: Skin Injury Risk Increased  Goal: Skin Health and Integrity  Outcome: Ongoing (see interventions/notes)

## 2019-08-13 NOTE — Care Management Notes (Signed)
[facility] Management Note    Patient Name: [name] [name]  Date of Birth: 1960-12-22  Sex: male  Date/Time of Admission: 08/11/2019  5:44 PM  Room/Bed: 12/A  Payor: [place] MEDICARE / Plan: [place] MEDICARE ADVANTAGE PPO / Product Type: PPO /    LOS: 2 days   Primary Care Providers:  [facility] ([facility])    Admitting Diagnosis:  Abscess of left periorbital region [H05.012]    Assessment:      08/13/19 1108   Assessment Details   Assessment Type Continued Assessment   Date of Care Management Update 08/13/19   Date of Next DCP Update 08/16/19   Care Management Plan   Discharge Planning Status plan in progress   Projected Discharge Date 08/15/19   Discharge Needs Assessment   Discharge Facility/Level of Care Needs Undetermined at this time       Discharge Plan:  Undetermined at this time  Patient continues to be medically treated; Psych consult, podiatry consult, wound care and diagnostic testing. Will follow Service for any discharge recommendations and assist as needed.    The patient will continue to be evaluated for developing discharge needs.     Case Manager: Breck Coons  Phone: [phone]

## 2019-08-13 NOTE — Consults (Signed)
[facility]  OPHTHALMOLOGY Consult Follow Up    [name] [name], [name] [name], 58 y.o. male  Date of Birth:  August 18, 1961  Date of Service:  08/13/2019    F/U evalutation of:  Preseptal cellulitis, left eye    Subjective:  Reports that he is feeling better today and feels drainage is improving. No changes to vision.    Data:    Mood and Affect Normal:  Yes  Mental Status - Oriented to person, place, and time:  Yes    Visual Acuity:  sc  cc  ph  near    OD  +   20/25      OS  +   20/30-         Intraocular Pressure: by [place]  OD:  16     OS:  21      Ocular Motility and Alignment:    normal OU    Visual fields by confrontation:    Normal OU     OD OS   Adnexa WNL Scant purulent drainage from abscess incision site superior to medial portion of brow, improving periorbital edema   Cornea WNL WNL, mucous on surface   Anterior Chamber WNL WNL   Iris/pupils   WNL WNL   APD No No   Lens 2+ NS 2+ NS     Pupil Dilation:  not done     Labs/Images:  Cr 1.45    A/P:   58 y.o. who presented with extensive preseptal cellulitis now s/p abscess drainage. 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 IOP acceptable, no RAPD, color plates near full.     Recommendations:  -Outside culture with clindamycin-resistant MRSA per verbal report; plan to follow outside culture at [facility] and culture result obtained here 08/11/19 (3+ gram positive cocci).   -Consider ID consult given MDRO  -Continue IV abx per primary team   -Will follow while patient is inpatient  -Recommend further evaluation of glaucoma and NPDR in an outpatient setting  - Iris nevus on right eye without concern for malignancy, recommend outpatient followup    Laurey Morale, MD 08/13/2019, 08:10     I saw and examined the patient.  I reviewed the resident's note.  I agree with the findings and plan of care as documented in the resident's note.  Any exceptions/additions are edited/noted.    Appears improved this AM. Will  plan on withdrawing some of patient's packing starting Saturday AM 08/14/19.    Delia Chimes, MD 08/13/2019, 14:28

## 2019-08-13 NOTE — Nurses Notes (Signed)
6n-12 Imm. Patient complaining of itching, asking about getting vistaril again. Nothing currently ordered. Thanks [phone]

## 2019-08-13 NOTE — Nurses Notes (Signed)
Patient lying in bed watching television. Patient verbalized complaints of pain, PRN medication administered per MAR. Assessment charted per flow sheet. PIV is easily flushed and capped following IV ABX. Patient refused to get out of bed this shift, or get a bath today. Heel dressings are clean, dry, and intact in Rooke boots. Sliding scale coverage and meal time insulin administered per MAR. Vital signs monitored. Call bell and personal belongings within reach. Hourly rounding performed by RN and CA to meet patient's needs. High fall and skin precautions maintained. Sitter select is on. Will continue to monitor.

## 2019-08-13 NOTE — Care Plan (Signed)
Problem: Adult Inpatient Plan of Care  Goal: Absence of Hospital-Acquired Illness or Injury  Outcome: Ongoing (see interventions/notes)     Problem: Adult Inpatient Plan of Care  Goal: Optimal Comfort and Wellbeing  Outcome: Ongoing (see interventions/notes)     Problem: Depression  Goal: Improved Mood  Outcome: Ongoing (see interventions/notes)     Problem: Fall Injury Risk  Goal: Absence of Fall and Fall-Related Injury  Outcome: Ongoing (see interventions/notes)   Patient sleeping at present but has been alert and oriented. Taking Oxycodone  10 mg every 4 hours for severe left eye pain; eye still draining bloody tan discharge;no c/o dyspnea; podiatry (Dr Gwenlyn Saran) saw patient and noted no dorsiflexion on LLE; evaluated left and right heel wounds and ordered testing for infection identification; is having moderate serosanguinous drainage on border dressing(see LDAW for wound details) is receiving IV Maxipime and IV Vancomycin for eye infection; remains in contact isolation; lungs are clear anteriorly on room air;has 2+ edema to feet; MD ordered Rooke boots to offload heel;receiving IVFs of NSS at 75 cc/hr; no falls or injuries; using urinal and voiding clear yellow urine; call bell within reach; will continue to monitor

## 2019-08-13 NOTE — Care Plan (Signed)
Physical Therapy    Attempted to see pt for PT this p.m.  Pt currently off the floor for testing.  Will follow for PT eval as pt is available.    Elveria Rising, PT, MPT  Pager [phone]

## 2019-08-13 NOTE — Pharmacy (Signed)
[facility] / Department of Pharmaceutical Services  Therapeutic Drug Monitoring: Vancomycin  08/13/2019      Patient name: [name] [name], [name] [name]  Date of Birth:  1961/03/12    Actual Weight:  Weight: 84.4 kg (186 lb 1.1 oz) (08/11/19 2100)     BMI:  BMI (Calculated): 25.64 (08/11/19 2100)    Date RPh Current regimen (including mg/kg) Indication Target Levels (mcg/mL) SCr (mg/dL) CrCl* (mL/min) Measured level (mcg/mL) Plan (including when levels are due) Comments   12/2 VK        Received Vanc 1500 mg on 12/1 @ 1500 and CTX 2g on 12/1@1730  and 12/2 @1200  from OSH   08/11/19 LL Initiating. Skin/soft tissue 10-15 1.43 60.2 - Vancomycin 1,750mg  (20 mg/kg actual BW) IV q24hr. Please obtain trough before 4th dose (on 12/4).    12/4 AG Vanc 1750mg  (20 mg/kg) every 24 hours.  SSTI  10-15 1.18 72.9 19.7, drawn appropriately Will adjust dose to 1250mg  starting 12/6 am as the current dose for this evening has already been infused.                                                      *Creatinine clearance is estimated by using the Cockcroft-Gault equation for adult patients and the Carol Ada for pediatric patients.    The decision to discontinue vancomycin therapy will be determined by the primary service.  Please contact the pharmacist with any questions regarding this patient's medication regimen.

## 2019-08-13 NOTE — Progress Notes (Signed)
[facility]  Medicine Progress Note  Full Code    [name] [name]  Date of service: 08/13/2019    Subjective: No acute events overnight. Patient states he is feeling itchy and anxious, no rash. He is able to open his eye more today. He states he usually does not do anything about his blood sugar unless it is over 200 but as educated that he needs to take the insulin we are recommending while in the hospital and moving forward. Regarding his living situation he states he feels safe at home. He was drinking alcohol once weekly (6-8 beers at a time) and his last drink was one week ago and has never had seizures or AVH while off of alcohol. He denies fevers, chills, chest pain and shortness of breath. No other issues.    Vital Signs:  Temp (24hrs) Max:37.2 C (99 F)      Systolic (92JJH), ERD:408 , Min:115 , XKG:818     Diastolic (56DJS), HFW:26, Min:68, Max:81    Temp  Avg: 36.9 C (98.5 F)  Min: 36.7 C (98.1 F)  Max: 37.2 C (99 F)  MAP (Non-Invasive)  Avg: 89.3 mmHG  Min: 83 mmHG  Max: 96 mmHG  Pulse  Avg: 73.3  Min: 60  Max: 84  Resp  Avg: 17.7  Min: 17  Max: 18  SpO2  Avg: 97.3 %  Min: 95 %  Max: 99 %  Pain Score (Numeric, Faces): 7  Fi02    I/O:  I/O last 24 hours:      Intake/Output Summary (Last 24 hours) at 08/13/2019 0827  Last data filed at 08/13/2019 0800  Gross per 24 hour   Intake 1955 ml   Output 1600 ml   Net 355 ml     I/O current shift:  12/04 0700 - 12/04 1859  In: 240 [P.O.:240]  Out: 225 [Urine:225]  Blood Sugars: Last Fingerstick:  No results found for: GLUCOSEPOC      .  acetaminophen (TYLENOL) tablet, 650 mg, Oral, Q4H PRN    .  cefepime (MAXIPIME) 2 g in NS 100 mL IVPB, 2 g, Intravenous, Q12H    .  gabapentin (NEURONTIN) capsule, 400 mg, Oral, 3x/day    .  heparin 5,000 unit/mL injection, 5,000 Units, Subcutaneous, Q8HRS    .  metroNIDAZOLE (FLAGYL) tablet, 500 mg, Oral, 3x/day    .  NS flush syringe, 2-6 mL, Intracatheter, Q8HRS    And    .  NS flush syringe, 2-6 mL, Intracatheter, Q1  MIN PRN    .  oxyCODONE (ROXICODONE) immediate release tablet, 5 mg, Oral, Q4H PRN    .  oxyCODONE (ROXICODONE) immediate release tablet, 10 mg, Oral, Q4H PRN    .  polyethylene glycol (MIRALAX) oral packet, 17 g, Oral, Daily    .  sennosides-docusate sodium (SENOKOT-S) 8.6-50mg  per tablet, 1 Tab, Oral, 2x/day    .  SSIP insulin lispro (HUMALOG) 100 units/mL injection, 0-12 Units, Subcutaneous, 4x/day PRN    .  vancomycin (VANCOCIN) 1,750 mg in NS 500 mL IVPB, 20 mg/kg, Intravenous, Q24H    .  Vancomycin IV - Pharmacist to Dose per Protocol, , Does not apply, Daily PRN        No Known Allergies    Physical Exam:   Constitutional: appears in good health and no distress, resting in bed comfortably  Eyes: right eye pupil is equal and round, left eye is less swollen and he is able to open it more today  ENT: Mouth mucous membranes moist.   Neck: trachea midline  Respiratory: Clear to auscultation bilaterally without wheezes, rhonchi, or crackles.   Cardiovascular: regular rate and rhythm, S1, S2 normal, no murmur, click, rub or gallop. No s3/s4  Gastrointestinal: Soft, non-tender, Bowel sounds normal  Genitourinary: Deferred  Musculoskeletal: Head atraumatic and normocephalic, no pain on palpation of spine  Integumentary:  Skin warm and dry and skin lesion over left eye/face, diabetic foot wounds on BL heels covered today, rook boots in place, no rash on back  Neurologic: Alert and oriented x3, has weakened dorsi and plantarflexion of bilateral feet that he states is baseline from his diabetes, no tremor  Psychiatric: Affect Normal  [place]: Eye opening: 4 spontaneous, Verbal resonse:  5 oriented, Best motor response:  6 obeys commands      Labs:  Lab Results Today:    Results for orders placed or performed during the hospital encounter of 08/11/19 (from the past 24 hour(s))   POC BLOOD GLUCOSE (RESULTS)   Result Value Ref Range    GLUCOSE, POC 167 (H) 70 - 105 mg/dl   C-REACTIVE PROTEIN(CRP),INFLAMMATION   Result Value  Ref Range    CRP INFLAMMATION 154.1 (H) <=8.0 mg/L   POC BLOOD GLUCOSE (RESULTS)   Result Value Ref Range    GLUCOSE, POC 295 (H) 70 - 105 mg/dl   POC BLOOD GLUCOSE (RESULTS)   Result Value Ref Range    GLUCOSE, POC 139 (H) 70 - 105 mg/dl   MAGNESIUM   Result Value Ref Range    MAGNESIUM 2.0 1.6 - 2.6 mg/dL   BASIC METABOLIC PANEL   Result Value Ref Range    SODIUM 138 136 - 145 mmol/L    POTASSIUM 4.2 3.5 - 5.1 mmol/L    CHLORIDE 109 96 - 111 mmol/L    CO2 TOTAL 21 (L) 22 - 32 mmol/L    ANION GAP 8 4 - 13 mmol/L    CALCIUM 8.3 (L) 8.5 - 10.2 mg/dL    GLUCOSE 150 (H) 65 - 139 mg/dL    BUN 19 8 - 25 mg/dL    CREATININE 1.18 0.62 - 1.27 mg/dL    BUN/CREA RATIO 16 6 - 22    ESTIMATED GFR >60 >60 mL/min/1.28m^2   PHOSPHORUS   Result Value Ref Range    PHOSPHORUS 3.0 2.4 - 4.7 mg/dL   CBC WITH DIFF   Result Value Ref Range    WBC 6.7 3.7 - 11.0 x10^3/uL    RBC 3.43 (L) 4.50 - 6.10 x10^6/uL    HGB 10.5 (L) 13.4 - 17.5 g/dL    HCT 31.0 (L) 38.9 - 52.0 %    MCV 90.4 78.0 - 100.0 fL    MCH 30.6 26.0 - 32.0 pg    MCHC 33.9 31.0 - 35.5 g/dL    RDW-CV 12.9 11.5 - 15.5 %    PLATELETS 202 150 - 400 x10^3/uL    MPV 11.1 8.7 - 12.5 fL    NEUTROPHIL % 71 %    LYMPHOCYTE % 16 %    MONOCYTE % 8 %    EOSINOPHIL % 4 %    BASOPHIL % 1 %    NEUTROPHIL # 4.79 1.50 - 7.70 x10^3/uL    LYMPHOCYTE # 1.05 1.00 - 4.80 x10^3/uL    MONOCYTE # 0.53 0.20 - 1.10 x10^3/uL    EOSINOPHIL # 0.27 <=0.50 x10^3/uL    BASOPHIL # <0.10 <=0.20 x10^3/uL    IMMATURE GRANULOCYTE % 0 0 - 1 %  IMMATURE GRANULOCYTE # <0.10 <0.10 x10^3/uL   POC BLOOD GLUCOSE (RESULTS)   Result Value Ref Range    GLUCOSE, POC 146 (H) 70 - 105 mg/dl       Radiology:    CXR 12/4:  IMPRESSION:  There is mild interstitial prominence within the lungs  bilaterally, right greater than left increased from prior study and  concerning for infectious process. There is no evidence for pleural  effusion or pulmonary edema.      Microbiology:  urine streptococcus and legionella antigen negative,  blood cultures pending, sterile site culture 4+ PMNs and 3+ GPC (Staph Aureus)    PT/OT: Yes    Consults: Ophthalmology, Wound Care, Podiatry, MNT    Hardware (lines, foley's, tubes): PIV    Assessment/ Plan:   [name] [name] is a 58 year old male with a PMH of insulin dependent type 2 DM who presents as a transfer from and outside facility for periorbital abscess requiring drainage and IV antibiotics. No longer febrile or tachycardic on current IV antibiotics and is otherwise stable on floor status.  Active Hospital Problems    Diagnosis   . Preseptal cellulitis of left eye   . Sepsis due to methicillin resistant Staphylococcus aureus (MRSA) without acute organ dysfunction (CMS HCC)   . Abscess of left periorbital region     [facility]: I believe patient has [place] at this time as he is alert and oriented and understands his clinical course and treatment options.    Leukocytosis-Resolved, Hyperthermia-Resolved  Preseptal Cellulitis with Abscess growing MRSA at outside facility  -Normothermic, not tachycardic  -CRP 210, pending 97.5  -Vancomycin, Cefepime, Flagyl (given possible concomitant DM foot infection)                -Was on vancomycin and ceftriaxone at outside facility   -Blood cultures no growth here and at outside facility  -Cultures from outside facility was clindamycin resistant and vancomycin sensitive (see media for report)  -ID consulted and appreciate assitance  -Ophthalmology performed I&D at bedside and are following (appreciate assistance)                -No need for OR or CT                -Ophthalmology states EOMI and not concerning for septal involvement at this  time.                -Further evaluation for glaucoma, right eye nevus, and NPDR as outpatient    Concern for Pneumonia  -Asymptomatic  -CXR as above  -Covid and respiratory biofire ordered  -Procalcitonin  -Added doxycyline 100mg  BID    AKI-Improving  -Unknown baseline  -FeNa indicating pre-renal  -Encouraging PO intake now that improving, will  add more IVF if needed at later date  -Will continue to monitor     DM Foot Wound with Concern for Osteomyelitis  -Xray concerning for osteomyelitis  -Consulting podiatry   -No surgical intervention at this time   -Nuclear medicine scan ordered  -On vancomycin, cefepime, flagyl  -Wound care consulted and appreciate recommendations   -Left foot Xray to rule out osteomyelitis   -Podiatry follow up as outpatient for DM shoe fitting   -MNT consult    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   -Hemoglobin A1c 8.8  -Holding home regimen (Lantus 20 units nightly and unclear short acting insulin regimen)  -Starting SSI with QID POC checks-encouraged to take recommended insulin  -Starting glargine 3  units and lispro 1 unit TID based on requirements yesterday  -Starting gabapentin 400mg  TID (on 1200 TID at home) given concomitant pain management during admission  -Will adjust as necessary  -DM education consult once have established what insulin regimen he will go home on    Constipation  -Continue Miralax  -Schedule Senakot     DVT/PE Prophylaxis: Heparin    Disposition Planning: TBD  current recommendations are IPR    Leanor Kail, MD      I saw and examined the patient.  I reviewed the resident's note.  I agree with the findings and plan of care as documented in the resident's note.  Any exceptions/additions are edited/noted.  Will likely need PICC and long term Abx  Seems to have [facility] - knows what is going on and agreeable for treatment  Shane Crutch., MD

## 2019-08-14 ENCOUNTER — Inpatient Hospital Stay (HOSPITAL_COMMUNITY): Payer: Medicare PPO

## 2019-08-14 DIAGNOSIS — J9811 Atelectasis: Secondary | ICD-10-CM

## 2019-08-14 DIAGNOSIS — J90 Pleural effusion, not elsewhere classified: Secondary | ICD-10-CM

## 2019-08-14 DIAGNOSIS — I2510 Atherosclerotic heart disease of native coronary artery without angina pectoris: Secondary | ICD-10-CM

## 2019-08-14 DIAGNOSIS — K5730 Diverticulosis of large intestine without perforation or abscess without bleeding: Secondary | ICD-10-CM

## 2019-08-14 DIAGNOSIS — R911 Solitary pulmonary nodule: Secondary | ICD-10-CM

## 2019-08-14 DIAGNOSIS — R918 Other nonspecific abnormal finding of lung field: Secondary | ICD-10-CM

## 2019-08-14 LAB — CBC WITH DIFF
BASOPHIL #: <0.1 10*3/uL (ref <=0.20)
BASOPHIL %: 1 %
EOSINOPHIL #: 0.22 10*3/uL (ref <=0.50)
EOSINOPHIL %: 4 %
HCT: 30.5 % — ABNORMAL LOW (ref 38.9–52.0)
HGB: 10 g/dL — ABNORMAL LOW (ref 13.4–17.5)
IMMATURE GRANULOCYTE #: <0.1 10*3/uL (ref <0.10)
IMMATURE GRANULOCYTE %: 1 % (ref 0–1)
LYMPHOCYTE #: 1.02 10*3/uL (ref 1.00–4.80)
LYMPHOCYTE %: 18 %
MCH: 29.9 pg (ref 26.0–32.0)
MCHC: 32.8 g/dL (ref 31.0–35.5)
MCV: 91 fL (ref 78.0–100.0)
MONOCYTE #: 0.46 10*3/uL (ref 0.20–1.10)
MONOCYTE %: 8 %
MPV: 10.7 fL (ref 8.7–12.5)
NEUTROPHIL #: 3.97 10*3/uL (ref 1.50–7.70)
NEUTROPHIL %: 68 %
PLATELETS: 205 10*3/uL (ref 150–400)
RBC: 3.35 10*6/uL — ABNORMAL LOW (ref 4.50–6.10)
RDW-CV: 12.8 % (ref 11.5–15.5)
WBC: 5.8 10*3/uL (ref 3.7–11.0)

## 2019-08-14 LAB — BASIC METABOLIC PANEL
ANION GAP: 9 mmol/L (ref 4–13)
BUN/CREA RATIO: 15 (ref 6–22)
BUN: 18 mg/dL (ref 8–25)
CALCIUM: 8.7 mg/dL (ref 8.5–10.2)
CHLORIDE: 111 mmol/L (ref 96–111)
CO2 TOTAL: 19 mmol/L — ABNORMAL LOW (ref 22–32)
CREATININE: 1.19 mg/dL (ref 0.62–1.27)
ESTIMATED GFR: >60 mL/min/{1.73_m2} (ref >60)
GLUCOSE: 170 mg/dL — ABNORMAL HIGH (ref 65–139)
POTASSIUM: 4.2 mmol/L (ref 3.5–5.1)
SODIUM: 139 mmol/L (ref 136–145)

## 2019-08-14 LAB — IRON TRANSFERRIN AND TIBC
IRON (TRANSFERRIN) SATURATION: 29 % (ref 16–50)
IRON: 46 ug/dL — ABNORMAL LOW (ref 55–175)
TOTAL IRON BINDING CAPACITY: 160 ug/dL — ABNORMAL LOW (ref 260–400)
TRANSFERRIN: 114 mg/dL — ABNORMAL LOW (ref 180–360)

## 2019-08-14 LAB — MAGNESIUM: MAGNESIUM: 1.7 mg/dL (ref 1.6–2.6)

## 2019-08-14 LAB — C-REACTIVE PROTEIN(CRP),INFLAMMATION: CRP INFLAMMATION: 58.3 mg/L — ABNORMAL HIGH (ref <=8.0)

## 2019-08-14 LAB — STERILE SITE CULTURE AND GRAM STAIN, AEROBIC

## 2019-08-14 LAB — POC BLOOD GLUCOSE (RESULTS)
GLUCOSE, POC: 163 mg/dL — ABNORMAL HIGH (ref 70–105)
GLUCOSE, POC: 194 mg/dL — ABNORMAL HIGH (ref 70–105)
GLUCOSE, POC: 242 mg/dL — ABNORMAL HIGH (ref 70–105)
GLUCOSE, POC: 240 mg/dL — ABNORMAL HIGH (ref 70–105)

## 2019-08-14 LAB — PHOSPHORUS: PHOSPHORUS: 3.3 mg/dL (ref 2.4–4.7)

## 2019-08-14 LAB — COVID-19 ~~LOC~~ MOLECULAR LAB TESTING: SARS-CoV-2: NOT DETECTED

## 2019-08-14 LAB — PROCALCITONIN REFLEX 6HR: PROCALCITONIN: 0.18 ng/mL (ref <0.50)

## 2019-08-14 LAB — FERRITIN: FERRITIN: 314 ng/mL — ABNORMAL HIGH (ref 20–300)

## 2019-08-14 MED ORDER — INSULIN GLARGINE (U-100) 100 UNIT/ML SUBCUTANEOUS SOLUTION
6.00 [IU] | Freq: Every evening | SUBCUTANEOUS | Status: DC
Start: 2019-08-14 — End: 2019-08-15
  Administered 2019-08-14: 6 [IU] via SUBCUTANEOUS
  Filled 2019-08-14: qty 6

## 2019-08-14 MED ORDER — INSULIN LISPRO (U-100) 100 UNIT/ML SUBCUTANEOUS SOLUTION
2.00 [IU] | Freq: Three times a day (TID) | SUBCUTANEOUS | Status: DC
Start: 2019-08-14 — End: 2019-08-15
  Administered 2019-08-14 – 2019-08-15 (×3): 2 [IU] via SUBCUTANEOUS
  Filled 2019-08-14: qty 3

## 2019-08-14 MED ORDER — ALBUTEROL SULFATE 2.5 MG/3 ML (0.083 %) SOLUTION FOR NEBULIZATION
2.5000 mg | INHALATION_SOLUTION | RESPIRATORY_TRACT | Status: DC | PRN
Start: 2019-08-14 — End: 2019-08-17

## 2019-08-14 MED ORDER — PERFLUTREN LIPID MICROSPHERES 1.1 MG/ML INTRAVENOUS SUSPENSION
2.00 mL | INTRAVENOUS | Status: AC
Start: 2019-08-14 — End: 2019-08-16
  Administered 2019-08-16: 2 mL via INTRAVENOUS

## 2019-08-14 MED ORDER — MELATONIN 3 MG TABLET
3.00 mg | ORAL_TABLET | Freq: Every evening | ORAL | Status: DC
Start: 2019-08-14 — End: 2019-08-17
  Administered 2019-08-14 – 2019-08-16 (×3): 3 mg via ORAL
  Filled 2019-08-14 (×3): qty 1

## 2019-08-14 MED ORDER — MAGNESIUM SULFATE 4 GRAM/100 ML (4 %) IN WATER INTRAVENOUS PIGGYBACK
4.0000 g | INJECTION | Freq: Once | INTRAVENOUS | Status: AC
Start: 2019-08-14 — End: 2019-08-14
  Administered 2019-08-14: 12:00:00 0 g via INTRAVENOUS
  Administered 2019-08-14: 4 g via INTRAVENOUS
  Filled 2019-08-14: qty 100

## 2019-08-14 MED ORDER — OXYCODONE 5 MG TABLET
10.00 mg | ORAL_TABLET | Freq: Four times a day (QID) | ORAL | Status: DC | PRN
Start: 2019-08-14 — End: 2019-08-16
  Administered 2019-08-14 – 2019-08-16 (×8): 10 mg via ORAL
  Filled 2019-08-14 (×8): qty 2

## 2019-08-14 MED ORDER — OXYCODONE 5 MG TABLET
5.00 mg | ORAL_TABLET | Freq: Four times a day (QID) | ORAL | Status: DC | PRN
Start: 2019-08-14 — End: 2019-08-16

## 2019-08-14 NOTE — Nurses Notes (Signed)
Patient leaving unit for CT scan.

## 2019-08-14 NOTE — Consults (Signed)
[facility]  OPHTHALMOLOGY Consult Follow Up    [name] [name], [name] [name], 58 y.o. male  Date of Birth:  Feb 25, 1961  Date of Service:  08/14/2019    F/U evalutation of:  Preseptal cellulitis, left eye    Subjective:  Reports no changes to vision but that his forehead itches a lot. He wasn't sure if the patch was supposed to block his vision. Educated pt to not touch the area, even when it itches as it needs to heal properly and this could hinder this.     Data:    Mood and Affect Normal:  Yes  Mental Status - Oriented to person, place, and time:  Yes    Visual Acuity:  sc  cc  ph  near    OD 20/40 +   20/25      OS 20/40 +   20/30-         Intraocular Pressure: by [place]  OD:  22     OS:  18      Ocular Motility and Alignment:    normal OU    Visual fields by confrontation:    Normal OU     OD OS   Adnexa WNL Crusting present over cheek, nasal bridge and eye brow. Has packing in place connecting from medial eye brow to lateral nasal bridge with drainage.   Cornea WNL WNL   Anterior Chamber WNL WNL   Iris/pupils   WNL WNL   APD No No   Lens 2+ NS 2+ NS     Pupil Dilation:  not done     Labs/Images:  Cr 1.45    A/P:   58 y.o. with preseptal cellulitis. 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 IOP acceptable, no RAPD, color plates near full.     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 Please keep in place and keep patient from pulling at dressing/scratching around eyes as can hinder wound healing  process. Consider mittens if necessary.   -Plan to change dressing again 08/16/2019; please call ophthalmology if patient is to be discharged before then (he still has iodoform packing in wound to keep abscess from recollecting but this needs to be pulled when drainage improves)  -Will follow while inpatient     Cannon Kettle, MD  08/14/2019, 12:32    I saw and examined the patient.  I reviewed the resident's note.  I agree with the findings and plan of care as documented in the resident's note.  Any exceptions/additions are edited/noted.  Delia Chimes, MD 08/14/2019, 17:59

## 2019-08-14 NOTE — Progress Notes (Signed)
[facility]  Medicine Progress Note  Full Code    [name] [name]  Date of service: 08/14/2019    Subjective: No acute events overnight. Patient only complains of difficulty sleeping. He denies fevers, chills, chest pain, shortness of breath or any other issues. He states he does want to drink and that ativan usually helps his cravings but is uninterested in talking with psychiatry about options of other medications to help with his cravings. He states that he has done drugs in the past but has never used IV drugs. 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 He has no other concerns or complaints at this time.    Assessment of [name] [name]: Asked patient to describe his medical conditions and what he takes for them and he understands his disease and his medications and the consequences of not taking his medications but seems to take his insulin different than prescribed. He was alert and oriented. He can do serial math problems, had good immediate recall of 3/3 words and could recall 2/3 words after 5 minutes. He has not been refusing care and is agreeable to all of the treatment plan. We currently think he has decision making capacity.    Vital Signs:  Temp (24hrs) Max:36.9 C (68.1 F)      Systolic (27NTZ), GYF:749 , Min:114 , SWH:675     Diastolic (91MBW), GYK:59, Min:66, Max:82    Temp  Avg: 36.7 C (98.1 F)  Min: 36.4 C (97.6 F)  Max: 36.9 C (98.5 F)  MAP (Non-Invasive)  Avg: 90.5 mmHG  Min: 78 mmHG  Max: 103 mmHG  Pulse  Avg: 75.3  Min: 61  Max: 107  Resp  Avg: 18  Min: 16  Max: 20  SpO2  Avg: 96 %  Min: 93 %  Max: 98 %  Pain Score (Numeric, Faces): 7  Fi02    I/O:  I/O last 24 hours:      Intake/Output Summary (Last 24 hours) at 08/14/2019 0822  Last data filed at 08/14/2019 0600  Gross per 24 hour   Intake 344 ml   Output 1175 ml   Net -831 ml     I/O current shift:  No intake/output data recorded.  Blood Sugars: Last Fingerstick:  No results found  for: GLUCOSEPOC      .  acetaminophen (TYLENOL) tablet, 650 mg, Oral, Q4H PRN    .  cefepime (MAXIPIME) 2 g in NS 100 mL IVPB, 2 g, Intravenous, Q12H    .  doxycycline tablet, 100 mg, Oral, 2x/day    .  gabapentin (NEURONTIN) capsule, 400 mg, Oral, 3x/day    .  heparin 5,000 unit/mL injection, 5,000 Units, Subcutaneous, Q8HRS    .  insulin glargine (LANTUS) 100 units/mL injection, 3 Units, Subcutaneous, NIGHTLY    .  insulin lispro (HUMALOG) 100 units/mL injection, 1 Units, Subcutaneous, 3x/day AC    .  magnesium sulfate 4 G in SW 100 mL premix IVPB, 4 g, Intravenous, Once    .  metroNIDAZOLE (FLAGYL) tablet, 500 mg, Oral, 3x/day    .  NS flush syringe, 2-6 mL, Intracatheter, Q8HRS    And    .  NS flush syringe, 2-6 mL, Intracatheter, Q1 MIN PRN    .  oxyCODONE (ROXICODONE) immediate release tablet, 5 mg, Oral, Q4H PRN    .  oxyCODONE (ROXICODONE) immediate release tablet, 10 mg, Oral, Q4H PRN    .  polyethylene  glycol (MIRALAX) oral packet, 17 g, Oral, Daily    .  sennosides-docusate sodium (SENOKOT-S) 8.6-50mg  per tablet, 1 Tab, Oral, 2x/day    .  SSIP insulin lispro (HUMALOG) 100 units/mL injection, 0-12 Units, Subcutaneous, 4x/day PRN    .  [START ON 08/15/2019] vancomycin (VANCOCIN) 1,250 mg in NS 250 mL IVPB, 15 mg/kg, Intravenous, Q24H    .  Vancomycin IV - Pharmacist to Dose per Protocol, , Does not apply, Daily PRN        No Known Allergies    Physical Exam:   Constitutional: appears in good health and no distress, resting in bed comfortably  Eyes: right eye pupil is equal and round, left eye is less swollen and he is able to open it more today, dressing in place  ENT: Mouth mucous membranes moist.   Neck: trachea midline  Respiratory: Mild adventious sounds on left but otherwise clear to ascultation  Cardiovascular: regular rate and rhythm, S1, S2 normal, no murmur, click, rub or gallop. No s3/s4  Gastrointestinal: Soft, non-tender, Bowel sounds normal  Genitourinary: Deferred  Musculoskeletal: Head  atraumatic and normocephalic, no pain on palpation of spine, no edema  Integumentary:  Skin warm and dry and skin lesion over left eye/face, rook boots in place  Neurologic: Alert and oriented x3, no tremor  Psychiatric: Affect Normal  [place]: Eye opening: 4 spontaneous, Verbal resonse:  5 oriented, Best motor response:  6 obeys commands      Labs:  Lab Results Today:    Results for orders placed or performed during the hospital encounter of 08/11/19 (from the past 24 hour(s))   IONIZED CALCIUM   Result Value Ref Range    PH (VENOUS) 7.38 7.31 - 7.41    IONIZED CALCIUM 1.25 1.10 - 1.35 mmol/L   POC BLOOD GLUCOSE (RESULTS)   Result Value Ref Range    GLUCOSE, POC 216 (H) 70 - 105 mg/dl   C-REACTIVE PROTEIN(CRP),INFLAMMATION   Result Value Ref Range    CRP INFLAMMATION 97.5 (H) <=8.0 mg/L   CREATINE KINASE (CK), TOTAL, SERUM   Result Value Ref Range    CREATINE KINASE 17 (L) 45 - 225 U/L   RESPIRATORY VIRUS PANEL BY BIOFIRE FILM ARRAY    Specimen: Nasopharyngeal Swab   Result Value Ref Range    ADENOVIRUS ARRAY Target Not Detected Target Not Detected    CORONAVIRUS 229E Target Not Detected Target Not Detected    CORONAVIRUS HKU1 Target Not Detected Target Not Detected    CORONAVIRUS NL63 Target Not Detected Target Not Detected    CORONAVIRUS OC43 Target Not Detected Target Not Detected    METAPNEUMOVIRUS ARRAY Target Not Detected Target Not Detected    RHINOVIRUS/ENTEROVIRUS ARRAY Target Not Detected Target Not Detected    INFLUENZA A Target Not Detected Target Not Detected    INFLUENZA B ARRAY Target Not Detected Target Not Detected    PARAINFLUENZA 1 ARRAY Target Not Detected Target Not Detected    PARAINFLUENZA 2 ARRAY Target Not Detected Target Not Detected    PARAINFLUENZA 3 ARRAY Target Not Detected Target Not Detected    PARAINFLUENZA 4 ARRAY Target Not Detected Target Not Detected    RSV ARRAY Target Not Detected Target Not Detected    BORDETELLA PERTUSSIS ARRAY Target Not Detected Target Not Detected     CHLAMYDOPHILA PNEUMONIAE ARRAY Target Not Detected Target Not Detected    MYCOPLASMA PNEUMONIAE ARRAY Target Not Detected Target Not Detected   PROCALCITONIN ALGORITHM   Result Value Ref Range  PROCALCITONIN  0.18 <0.50 ng/mL   POC BLOOD GLUCOSE (RESULTS)   Result Value Ref Range    GLUCOSE, POC 189 (H) 70 - 105 mg/dl   POC BLOOD GLUCOSE (RESULTS)   Result Value Ref Range    GLUCOSE, POC 181 (H) 70 - 105 mg/dl   VANCOMYCIN, TROUGH   Result Value Ref Range    VANCOMYCIN TROUGH 19.7 10.0 - 20.0 ug/mL    DOSE DATE 08/13/2019     DOSE TIME 10:00 PM    PROCALCITONIN REFLEX 6HR   Result Value Ref Range    PROCALCITONIN  0.18 <0.50 ng/mL   MAGNESIUM   Result Value Ref Range    MAGNESIUM 1.7 1.6 - 2.6 mg/dL   BASIC METABOLIC PANEL   Result Value Ref Range    SODIUM 139 136 - 145 mmol/L    POTASSIUM 4.2 3.5 - 5.1 mmol/L    CHLORIDE 111 96 - 111 mmol/L    CO2 TOTAL 19 (L) 22 - 32 mmol/L    ANION GAP 9 4 - 13 mmol/L    CALCIUM 8.7 8.5 - 10.2 mg/dL    GLUCOSE 170 (H) 65 - 139 mg/dL    BUN 18 8 - 25 mg/dL    CREATININE 1.19 0.62 - 1.27 mg/dL    BUN/CREA RATIO 15 6 - 22    ESTIMATED GFR >60 >60 mL/min/1.58m^2   PHOSPHORUS   Result Value Ref Range    PHOSPHORUS 3.3 2.4 - 4.7 mg/dL   CBC WITH DIFF   Result Value Ref Range    WBC 5.8 3.7 - 11.0 x10^3/uL    RBC 3.35 (L) 4.50 - 6.10 x10^6/uL    HGB 10.0 (L) 13.4 - 17.5 g/dL    HCT 30.5 (L) 38.9 - 52.0 %    MCV 91.0 78.0 - 100.0 fL    MCH 29.9 26.0 - 32.0 pg    MCHC 32.8 31.0 - 35.5 g/dL    RDW-CV 12.8 11.5 - 15.5 %    PLATELETS 205 150 - 400 x10^3/uL    MPV 10.7 8.7 - 12.5 fL    NEUTROPHIL % 68 %    LYMPHOCYTE % 18 %    MONOCYTE % 8 %    EOSINOPHIL % 4 %    BASOPHIL % 1 %    NEUTROPHIL # 3.97 1.50 - 7.70 x10^3/uL    LYMPHOCYTE # 1.02 1.00 - 4.80 x10^3/uL    MONOCYTE # 0.46 0.20 - 1.10 x10^3/uL    EOSINOPHIL # 0.22 <=0.50 x10^3/uL    BASOPHIL # <0.10 <=0.20 x10^3/uL    IMMATURE GRANULOCYTE % 1 0 - 1 %    IMMATURE GRANULOCYTE # <0.10 <0.10 x10^3/uL   IRON TRANSFERRIN AND TIBC   Result  Value Ref Range    TOTAL IRON BINDING CAPACITY 160 (L) 260 - 400 ug/dL    IRON (TRANSFERRIN) SATURATION 29 16 - 50 %    IRON 46 (L) 55 - 175 ug/dL    TRANSFERRIN 114 (L) 180 - 360 mg/dL   FERRITIN   Result Value Ref Range    FERRITIN 314 (H) 20 - 300 ng/mL   POC BLOOD GLUCOSE (RESULTS)   Result Value Ref Range    GLUCOSE, POC 163 (H) 70 - 105 mg/dl       Radiology:    CXR 12/4:  IMPRESSION:  There is mild interstitial prominence within the lungs  bilaterally, right greater than left increased from prior study and  concerning for infectious process. There is no  evidence for pleural  effusion or pulmonary edema.    Nuclear Imaging of Left Foot  IMPRESSION:     1. Findings concerning for osteomyelitis of the inferior left calcaneus  given radiotracer uptake and erosive bony changes. Soft tissue ulceration  with radiotracer uptake at this location is compatible with cellulitis.  2. Focal soft tissue radiotracer uptake superior to the left proximal first  phalange with extension of radiotracer into adjacent bone, findings raise  concern for cellulitis and osteomyelitis. The proximal phalange of the left  first toe demonstrates bony changes consistent with prior fracture of  uncertain chronicity.  3. Generalized hyperemia of the visualized lower left extremity especially  involving the foot, findings are compatible with cellulitis    CT Chest:  IMPRESSION:  1.  Mild subsegmental atelectasis at bilateral lung bases with trace  pleural effusion.  2.  No suspicious cavitary lung nodules to suggest septic emboli. No focal  consolidation or abnormal groundglass opacities.  3.  Small solid nodule along the left fissure measuring 7 mm is  nonspecific. Recommend reassessment of this nodule in 6-12 months.    Microbiology:  urine streptococcus and legionella antigen negative, blood cultures no growth, sterile site culture MRSA (sensitivities below)   Ciprofloxacin >=8 mcg/mL Resistant     Clindamycin >=4 mcg/mL Resistant      Daptomycin 0.5 mcg/mL Sensitive     Doxycycline <=0.5 mcg/mL Sensitive     Erythromycin >=8 mcg/mL Resistant     Gentamicin <=0.5 mcg/mL Sensitive1     Levofloxacin 4 mcg/mL Resistant     Linezolid 2 mcg/mL Sensitive     Oxacillin >=4 mcg/mL Resistant     Rifampin <=0.5 mcg/mL Sensitive2     Tetracycline <=1 mcg/mL Sensitive     Tigecycline <=0.12 mcg/mL Sensitive     Trimethoprim/Sulfamethoxazole <=10 mcg/mL Sensitive     Vancomycin 1 mcg/mL Sensitive          PT/OT: Yes    Consults: Ophthalmology, Wound Care, Podiatry, MNT, DM education    Hardware (lines, foley's, tubes): PIV    Assessment/ Plan:   [name] [name] is a 58 year old male with a PMH of insulin dependent type 2 DM who presents as a transfer from and outside facility for periorbital abscess requiring drainage and IV antibiotics. No longer febrile or tachycardic on current IV antibiotics and is otherwise stable on floor status.  Active Hospital Problems    Diagnosis   . Preseptal cellulitis of left eye   . Sepsis due to methicillin resistant Staphylococcus aureus (MRSA) without acute organ dysfunction (CMS HCC)   . Abscess of left periorbital region     [facility]: I believe patient has [place] at this time as he is alert and oriented and understands his clinical course and treatment options.    Leukocytosis-Resolved, Hyperthermia-Resolved  Preseptal Cellulitis with Abscess growing MRSA at outside facility  -Normothermic, not tachycardic  -Pain control with tylenol, oxy 5 and oxy 10-spacing pain medications out to Q6 hours  -CRP 210 inially and downtrending  -Vancomycin, Cefepime, Flagyl (given possible concomitant DM foot infection)                -Was on vancomycin and ceftriaxone at outside facility   -Blood cultures no growth here and at outside facility  -Cultures from outside facility was clindamycin resistant and vancomycin sensitive (see media for report and see above for our sensitivities)  -ID will be consulted officially when we are working on changing  antibiotics  -Ophthalmology performed  I&D at bedside on day of admission and are following (appreciate assistance)                -Further evaluation for glaucoma, right eye nevus, and NPDR as outpatient    Concern for Pneumonia  -Asymptomatic  -CXR as above  -Respiratory biofire negative  -Covid pending  -Procalcitonin wnl  -Doxycyline 100mg  BID for 7 days  -CT chest not concerning for septic emboli  -TTE pending    AKI-Improved and stable  -Unknown baseline  -FeNa indicating pre-renal  -Encouraging PO intake now that improving, will add more IVF if needed at later date  -Will continue to monitor     DM Foot Wound with Concern for Osteomyelitis  -Xray and nuclear medicine imaging concerning for osteomyelitis  -Consulting podiatry   -No surgical intervention at this time  -On vancomycin, cefepime, flagyl  -Wound care consulted and appreciate recommendations   -Podiatry follow up as outpatient for DM shoe fitting   -MNT consult    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   -Hemoglobin A1c 8.8  -Holding home regimen (Lantus 20 units nightly and unclear short acting insulin regimen)  -Starting SSI with QID POC checks-encouraged to take recommended insulin  -Glargine 6 units and lispro 2 unit TID based on requirements yesterday  -Starting gabapentin 400mg  TID (on 1200 TID at home) given concomitant pain management during admission  -Will adjust as necessary  -DM education consult once have established what insulin regimen he will go home on    Incidental Lung Nodule Found on CT  -Recommend repeat imaging in 6-12 months to follow this    Constipation-Improved  -BM 12/4  -Continue Miralax and Senakot    Question of [facility]  -We currently think he has [facility]  -Past paperwork suggesting he was deemed to not have capacitance   -Will consult psychiatry on Monday for assistance  -Currently complying with all medical care    DVT/PE Prophylaxis: Heparin    Disposition Planning: TBD  current recommendations are  IPR    Leanor Kail, MD        I saw and examined the patient.  I reviewed the resident's note.  I agree with the findings and plan of care as documented in the resident's note.  Any exceptions/additions are edited/noted.    Shane Crutch., MD

## 2019-08-14 NOTE — Nurses Notes (Signed)
Patient currently sitting upright in bedside chair eating dinner. Patient has gotten out of bed today for lunch and dinner meals, and is using the bedside commode to go to the bathroom. Patient verbalized complaints of pain, PRN medication administered per MAR. Assessment charted per flow sheet. PIV is easily flushed and capped. Vital signs monitored. Call bell and personal belongings within reach. Hourly rounding performed by RN and CA to meet patient's needs. High fall and skin precautions maintained. Sitter select is on. Will continue to monitor.

## 2019-08-14 NOTE — Nurses Notes (Signed)
Patient returned to unit. 

## 2019-08-14 NOTE — Care Plan (Signed)
Assumed care of patient at shift change. With night shift RN- identification bracelet was scanned, code status confirmed, and white board updated. Patient lying in bed watching television. Patient verbalized complaints of pain, PRN medication administered per MAR. Assessment charted per flow sheet. PIV is easily flushed and and infusing mag sulfate. STAT RN placed new PIV. Morning medication administered per MAR. Changed both heels dressing per Kardex instructions. Rooke Boots on. Plan of care reviewed with patient concerning IV ABX and wound care dressing changes. Patient agreeable to plan of care and verbalized understanding. Vital signs monitored. Call bell and personal belongings within reach. Hourly rounding performed by RN and CA to meet patient's needs. High fall and skin precautions maintained. Sitter select is on. Will continue to monitor.  Problem: Adult Inpatient Plan of Care  Goal: Plan of Care Review  Outcome: Ongoing (see interventions/notes)  Goal: Patient-Specific Goal (Individualized)  Outcome: Ongoing (see interventions/notes)  Goal: Absence of Hospital-Acquired Illness or Injury  Outcome: Ongoing (see interventions/notes)  Goal: Optimal Comfort and Wellbeing  Outcome: Ongoing (see interventions/notes)  Goal: Rounds/Family Conference  Outcome: Ongoing (see interventions/notes)     Problem: Depression  Goal: Improved Mood  Outcome: Ongoing (see interventions/notes)     Problem: Fall Injury Risk  Goal: Absence of Fall and Fall-Related Injury  Outcome: Ongoing (see interventions/notes)     Problem: Wound  Goal: Optimal Wound Healing  Outcome: Ongoing (see interventions/notes)     Problem: Infection  Goal: Infection Symptom Resolution  Outcome: Ongoing (see interventions/notes)     Problem: Mobility Impairment  Goal: Optimal Mobility  Outcome: Ongoing (see interventions/notes)     Problem: Skin Injury Risk Increased  Goal: Skin Health and Integrity  Outcome: Ongoing (see interventions/notes)

## 2019-08-15 LAB — CBC WITH DIFF
BASOPHIL #: <0.1 10*3/uL (ref <=0.20)
BASOPHIL %: 1 %
EOSINOPHIL #: 0.22 10*3/uL (ref <=0.50)
EOSINOPHIL %: 4 %
HCT: 29.9 % — ABNORMAL LOW (ref 38.9–52.0)
HGB: 9.9 g/dL — ABNORMAL LOW (ref 13.4–17.5)
IMMATURE GRANULOCYTE #: <0.1 10*3/uL (ref <0.10)
IMMATURE GRANULOCYTE %: 1 % (ref 0–1)
LYMPHOCYTE #: 1.08 10*3/uL (ref 1.00–4.80)
LYMPHOCYTE %: 18 %
MCH: 29.8 pg (ref 26.0–32.0)
MCHC: 33.1 g/dL (ref 31.0–35.5)
MCV: 90.1 fL (ref 78.0–100.0)
MONOCYTE #: 0.51 10*3/uL (ref 0.20–1.10)
MONOCYTE %: 8 %
MPV: 10.9 fL (ref 8.7–12.5)
NEUTROPHIL #: 4.11 10*3/uL (ref 1.50–7.70)
NEUTROPHIL %: 68 %
PLATELETS: 221 10*3/uL (ref 150–400)
RBC: 3.32 10*6/uL — ABNORMAL LOW (ref 4.50–6.10)
RDW-CV: 12.7 % (ref 11.5–15.5)
WBC: 6 10*3/uL (ref 3.7–11.0)

## 2019-08-15 LAB — BASIC METABOLIC PANEL
ANION GAP: 7 mmol/L (ref 4–13)
BUN/CREA RATIO: 13 (ref 6–22)
BUN: 18 mg/dL (ref 8–25)
CALCIUM: 8.5 mg/dL (ref 8.5–10.2)
CHLORIDE: 110 mmol/L (ref 96–111)
CO2 TOTAL: 21 mmol/L — ABNORMAL LOW (ref 22–32)
CREATININE: 1.36 mg/dL — ABNORMAL HIGH (ref 0.62–1.27)
ESTIMATED GFR: 57 mL/min/{1.73_m2} — ABNORMAL LOW (ref >60)
GLUCOSE: 163 mg/dL — ABNORMAL HIGH (ref 65–139)
POTASSIUM: 4.1 mmol/L (ref 3.5–5.1)
SODIUM: 138 mmol/L (ref 136–145)

## 2019-08-15 LAB — POC BLOOD GLUCOSE (RESULTS)
GLUCOSE, POC: 197 mg/dL — ABNORMAL HIGH (ref 70–105)
GLUCOSE, POC: 223 mg/dL — ABNORMAL HIGH (ref 70–105)
GLUCOSE, POC: 147 mg/dL — ABNORMAL HIGH (ref 70–105)
GLUCOSE, POC: 248 mg/dL — ABNORMAL HIGH (ref 70–105)

## 2019-08-15 LAB — C-REACTIVE PROTEIN(CRP),INFLAMMATION: CRP INFLAMMATION: 36.4 mg/L — ABNORMAL HIGH (ref <=8.0)

## 2019-08-15 LAB — MAGNESIUM: MAGNESIUM: 1.8 mg/dL (ref 1.6–2.6)

## 2019-08-15 LAB — PHOSPHORUS: PHOSPHORUS: 3.4 mg/dL (ref 2.4–4.7)

## 2019-08-15 LAB — PATH COMMENT: PATHOLOGIST INTERPRETATION: ABNORMAL — AB

## 2019-08-15 MED ORDER — INSULIN GLARGINE (U-100) 100 UNIT/ML SUBCUTANEOUS SOLUTION
8.00 [IU] | Freq: Every evening | SUBCUTANEOUS | Status: DC
Start: 2019-08-15 — End: 2019-08-16
  Administered 2019-08-15: 8 [IU] via SUBCUTANEOUS
  Filled 2019-08-15: qty 8

## 2019-08-15 MED ORDER — SODIUM CHLORIDE 0.9 % INTRAVENOUS SOLUTION
INTRAVENOUS | Status: AC
Start: 2019-08-15 — End: 2019-08-15
  Administered 2019-08-15: 11:00:00 via INTRAVENOUS

## 2019-08-15 MED ORDER — VANCOMYCIN IV - INTERMITTENT DOSING
Freq: Every day | Status: DC | PRN
Start: 2019-08-15 — End: 2019-08-16
  Administered 2019-08-16: 06:00:00

## 2019-08-15 MED ORDER — INSULIN LISPRO (U-100) 100 UNIT/ML SUBCUTANEOUS SOLUTION
4.00 [IU] | Freq: Three times a day (TID) | SUBCUTANEOUS | Status: DC
Start: 2019-08-15 — End: 2019-08-17
  Administered 2019-08-15 – 2019-08-17 (×7): 4 [IU] via SUBCUTANEOUS
  Filled 2019-08-15: qty 3

## 2019-08-15 NOTE — Care Plan (Signed)
Assumed care of patient at shift change. With night shift RN- identification bracelet was scanned, code status confirmed, and white board updated. Patient lying in bed watching television. Patient verbalized complaints of pain, PRN medication administered per MAR. Assessment charted per flow sheet. PIV is easily flushed and and capped. Morning medication administered per MAR. Changed both heels dressing per Kardex instructions. Rooke Boots on. Plan of care reviewed with patient concerning IV ABX and wound care dressing changes. Patient agreeable to plan of care and verbalized understanding. Vital signs monitored. Call bell and personal belongings within reach. Hourly rounding performed by RN and CA to meet patient's needs. High fall and skin precautions maintained. Sitter select is on. Will continue to monitor.    Problem: Adult Inpatient Plan of Care  Goal: Plan of Care Review  Outcome: Ongoing (see interventions/notes)  Goal: Patient-Specific Goal (Individualized)  Outcome: Ongoing (see interventions/notes)  Goal: Absence of Hospital-Acquired Illness or Injury  Outcome: Ongoing (see interventions/notes)  Goal: Optimal Comfort and Wellbeing  Outcome: Ongoing (see interventions/notes)  Goal: Rounds/Family Conference  Outcome: Ongoing (see interventions/notes)     Problem: Depression  Goal: Improved Mood  Outcome: Ongoing (see interventions/notes)     Problem: Fall Injury Risk  Goal: Absence of Fall and Fall-Related Injury  Outcome: Ongoing (see interventions/notes)     Problem: Wound  Goal: Optimal Wound Healing  Outcome: Ongoing (see interventions/notes)     Problem: Infection  Goal: Infection Symptom Resolution  Outcome: Ongoing (see interventions/notes)     Problem: Mobility Impairment  Goal: Optimal Mobility  Outcome: Ongoing (see interventions/notes)     Problem: Skin Injury Risk Increased  Goal: Skin Health and Integrity  Outcome: Ongoing (see interventions/notes)

## 2019-08-15 NOTE — Nurses Notes (Signed)
Patient currently sitting upright in bedside chair eating dinner. Patient verbalized complaints of pain, PRN medication administered per MAR. Assessment charted per flow sheet. PIV is easily flushed and infusing MIVF. Service came to bedside to discuss patient possible needing long term antibiotics and PICC placement. Patient aware the Opthalmology is coming in the morning to change left eye dressing. Vital signs monitored. Call bell and personal belongings within reach. Hourly rounding performed by RN and CA to meet patient's needs. High fall and skin precautions maintained. Sitter select is on. Will continue to monitor.

## 2019-08-15 NOTE — Progress Notes (Signed)
[facility]  Medicine Progress Note  Full Code    [name] [name]  Date of service: 08/15/2019    Subjective:   [name] [name] is feeling much better today.  His left eye is dressed.  He denies any chest pain or shortness of breath or abdominal pain.  In speaking with nurse, patient was amenable to bath yesterday and has been compliant with treatment program.  He is aware that he has infections any eye and feet and possibly long.  He is tolerating his antibiotics well.      Vital Signs:  Temp (24hrs) Max:36.8 C (14.9 F)      Systolic (70YOV), ZCH:885 , Min:117 , OYD:741     Diastolic (28NOM), VEH:20, Min:72, Max:81    Temp  Avg: 36.7 C (98 F)  Min: 36.3 C (97.3 F)  Max: 36.8 C (98.2 F)  MAP (Non-Invasive)  Avg: 95.7 mmHG  Min: 87 mmHG  Max: 102 mmHG  Pulse  Avg: 64.8  Min: 60  Max: 72  Resp  Avg: 17.4  Min: 16  Max: 18  SpO2  Avg: 96.8 %  Min: 95 %  Max: 99 %  Pain Score (Numeric, Faces): 9  Fi02    I/O:  I/O last 24 hours:      Intake/Output Summary (Last 24 hours) at 08/15/2019 1349  Last data filed at 08/15/2019 1248  Gross per 24 hour   Intake 1200 ml   Output 1330 ml   Net -130 ml     I/O current shift:  12/06 0700 - 12/06 1859  In: 480 [P.O.:480]  Out: 725 [Urine:725]  Blood Sugars: Last Fingerstick:  No results found for: GLUCOSEPOC      .  acetaminophen (TYLENOL) tablet, 650 mg, Oral, Q4H PRN    .  albuterol (PROVENTIL) 2.5 mg / 3 mL (0.083%) neb solution, 2.5 mg, Nebulization, Q4H PRN    .  cefepime (MAXIPIME) 2 g in NS 100 mL IVPB, 2 g, Intravenous, Q12H    .  doxycycline tablet, 100 mg, Oral, 2x/day    .  gabapentin (NEURONTIN) capsule, 400 mg, Oral, 3x/day    .  heparin 5,000 unit/mL injection, 5,000 Units, Subcutaneous, Q8HRS    .  insulin glargine (LANTUS) 100 units/mL injection, 8 Units, Subcutaneous, NIGHTLY    .  insulin lispro (HUMALOG) 100 units/mL injection, 4 Units, Subcutaneous, 3x/day AC    .  melatonin tablet, 3 mg, Oral, NIGHTLY    .  metroNIDAZOLE (FLAGYL) tablet, 500 mg, Oral,  3x/day    .  NS flush syringe, 2-6 mL, Intracatheter, Q8HRS    And    .  NS flush syringe, 2-6 mL, Intracatheter, Q1 MIN PRN    .  NS premix infusion, , Intravenous, Continuous    .  oxyCODONE (ROXICODONE) immediate release tablet, 5 mg, Oral, Q6H PRN    .  oxyCODONE (ROXICODONE) immediate release tablet, 10 mg, Oral, Q6H PRN    .  perflutren lipid microspheres (DEFINITY) 1.3 mL in NS 10 mL (tot vol) injection, 2 mL, Intravenous, Give in Cardiology    .  polyethylene glycol (MIRALAX) oral packet, 17 g, Oral, Daily    .  sennosides-docusate sodium (SENOKOT-S) 8.6-50mg  per tablet, 1 Tab, Oral, 2x/day    .  SSIP insulin lispro (HUMALOG) 100 units/mL injection, 0-12 Units, Subcutaneous, 4x/day PRN    .  Vancomycin IV - Pharmacist to Dose per Protocol, , Does not apply, Daily PRN    .  Vancomycin IV Intermittent  Dosing, , Does not apply, Daily PRN        No Known Allergies    Physical Exam:   Constitutional: appears in no distress, resting in bed comfortably  Eyes: right eye pupil is equal and round, left eye is less swollen and he is able to open it more today, dressing in place  ENT: Mouth mucous membranes moist.   Neck: trachea midline, no JVD  Respiratory: Mild adventious sounds on left but otherwise clear to ascultation  Cardiovascular: regular rate and rhythm, S1, S2 normal, no murmur, click, rub or gallop. No s3/s4  Gastrointestinal: Soft, non-tender, Bowel sounds normal  Genitourinary: Deferred  Musculoskeletal: Head atraumatic and normocephalic, no pain on palpation of spine, no edema  Integumentary:  Skin warm and dry and skin lesion over left eye/face, [place] boots   in place, trace pedal edema and ankles  Neurologic: Alert and oriented x3, no tremor  Psychiatric: Affect Normal  [place]: Eye opening: 4 spontaneous, Verbal resonse:  5 oriented, Best motor response:  6 obeys commands      Labs:  Lab Results Today:    Results for orders placed or performed during the hospital encounter of 08/11/19 (from the past  24 hour(s))   C-REACTIVE PROTEIN(CRP),INFLAMMATION   Result Value Ref Range    CRP INFLAMMATION 58.3 (H) <=8.0 mg/L   POC BLOOD GLUCOSE (RESULTS)   Result Value Ref Range    GLUCOSE, POC 242 (H) 70 - 105 mg/dl   POC BLOOD GLUCOSE (RESULTS)   Result Value Ref Range    GLUCOSE, POC 240 (H) 70 - 105 mg/dl   MAGNESIUM   Result Value Ref Range    MAGNESIUM 1.8 1.6 - 2.6 mg/dL   BASIC METABOLIC PANEL   Result Value Ref Range    SODIUM 138 136 - 145 mmol/L    POTASSIUM 4.1 3.5 - 5.1 mmol/L    CHLORIDE 110 96 - 111 mmol/L    CO2 TOTAL 21 (L) 22 - 32 mmol/L    ANION GAP 7 4 - 13 mmol/L    CALCIUM 8.5 8.5 - 10.2 mg/dL    GLUCOSE 163 (H) 65 - 139 mg/dL    BUN 18 8 - 25 mg/dL    CREATININE 1.36 (H) 0.62 - 1.27 mg/dL    BUN/CREA RATIO 13 6 - 22    ESTIMATED GFR 57 (L) >60 mL/min/1.55m^2   PHOSPHORUS   Result Value Ref Range    PHOSPHORUS 3.4 2.4 - 4.7 mg/dL   CBC WITH DIFF   Result Value Ref Range    WBC 6.0 3.7 - 11.0 x10^3/uL    RBC 3.32 (L) 4.50 - 6.10 x10^6/uL    HGB 9.9 (L) 13.4 - 17.5 g/dL    HCT 29.9 (L) 38.9 - 52.0 %    MCV 90.1 78.0 - 100.0 fL    MCH 29.8 26.0 - 32.0 pg    MCHC 33.1 31.0 - 35.5 g/dL    RDW-CV 12.7 11.5 - 15.5 %    PLATELETS 221 150 - 400 x10^3/uL    MPV 10.9 8.7 - 12.5 fL    NEUTROPHIL % 68 %    LYMPHOCYTE % 18 %    MONOCYTE % 8 %    EOSINOPHIL % 4 %    BASOPHIL % 1 %    NEUTROPHIL # 4.11 1.50 - 7.70 x10^3/uL    LYMPHOCYTE # 1.08 1.00 - 4.80 x10^3/uL    MONOCYTE # 0.51 0.20 - 1.10 x10^3/uL    EOSINOPHIL #  0.22 <=0.50 x10^3/uL    BASOPHIL # <0.10 <=0.20 x10^3/uL    IMMATURE GRANULOCYTE % 1 0 - 1 %    IMMATURE GRANULOCYTE # <0.10 <0.10 x10^3/uL   POC BLOOD GLUCOSE (RESULTS)   Result Value Ref Range    GLUCOSE, POC 197 (H) 70 - 105 mg/dl   POC BLOOD GLUCOSE (RESULTS)   Result Value Ref Range    GLUCOSE, POC 223 (H) 70 - 105 mg/dl       Radiology:    CXR 12/4:  IMPRESSION:  There is mild interstitial prominence within the lungs  bilaterally, right greater than left increased from prior study and  concerning  for infectious process. There is no evidence for pleural  effusion or pulmonary edema.    Nuclear Imaging of Left Foot  IMPRESSION:     1. Findings concerning for osteomyelitis of the inferior left calcaneus  given radiotracer uptake and erosive bony changes. Soft tissue ulceration  with radiotracer uptake at this location is compatible with cellulitis.  2. Focal soft tissue radiotracer uptake superior to the left proximal first  phalange with extension of radiotracer into adjacent bone, findings raise  concern for cellulitis and osteomyelitis. The proximal phalange of the left  first toe demonstrates bony changes consistent with prior fracture of  uncertain chronicity.  3. Generalized hyperemia of the visualized lower left extremity especially  involving the foot, findings are compatible with cellulitis    CT Chest:  IMPRESSION:  1.  Mild subsegmental atelectasis at bilateral lung bases with trace  pleural effusion.  2.  No suspicious cavitary lung nodules to suggest septic emboli. No focal  consolidation or abnormal groundglass opacities.  3.  Small solid nodule along the left fissure measuring 7 mm is  nonspecific. Recommend reassessment of this nodule in 6-12 months.    Microbiology:  urine streptococcus and legionella antigen negative, blood cultures no growth, sterile site culture MRSA (sensitivities below)   Ciprofloxacin >=8 mcg/mL Resistant     Clindamycin >=4 mcg/mL Resistant     Daptomycin 0.5 mcg/mL Sensitive     Doxycycline <=0.5 mcg/mL Sensitive     Erythromycin >=8 mcg/mL Resistant     Gentamicin <=0.5 mcg/mL Sensitive1     Levofloxacin 4 mcg/mL Resistant     Linezolid 2 mcg/mL Sensitive     Oxacillin >=4 mcg/mL Resistant     Rifampin <=0.5 mcg/mL Sensitive2     Tetracycline <=1 mcg/mL Sensitive     Tigecycline <=0.12 mcg/mL Sensitive     Trimethoprim/Sulfamethoxazole <=10 mcg/mL Sensitive     Vancomycin 1 mcg/mL Sensitive          PT/OT: Yes    Consults: Ophthalmology, Wound Care, Podiatry, MNT, DM  education    Hardware (lines, foley's, tubes): PIV    Assessment/ Plan:   [name] [name] is a 58 year old male with a PMH of insulin dependent type 2 DM who presents as a transfer from and outside facility for periorbital abscess requiring drainage and IV antibiotics. No longer febrile or tachycardic on current IV antibiotics and is otherwise stable on floor status.  He participates well with the treatment plan at this point.  He is tolerating his antibiotics well appears to have decision-making capacity.  He will likely will need long-term antibiotics for his osteomyelitis in his feet  Active Hospital Problems    Diagnosis   . Preseptal cellulitis of left eye   . Sepsis due to methicillin resistant Staphylococcus aureus (MRSA) without acute organ dysfunction (CMS HCC)   .  Abscess of left periorbital region         Preseptal Cellulitis with Abscess growing MRSA at outside facility  -Normothermic, not tachycardic  -Pain control with tylenol, oxy 5 and oxy 10-spacing pain medications out to Q6 hours  -CRP 210 inially and downtrending  -Vancomycin, Cefepime, Flagyl (given possible concomitant DM foot infection) and doxycycline                -Was on vancomycin and ceftriaxone at outside facility   -Blood cultures no growth here and at outside facility  -Cultures from outside facility was clindamycin resistant and vancomycin sensitive (see media for report and see above for our sensitivities)  -ID will be consulted officially when we are working on changing antibiotics  -Ophthalmology performed I&D at bedside on day of admission and are following (appreciate assistance)              -Further evaluation for glaucoma, right eye nevus, and NPDR as outpatient   -ophthalmology to perform dressing change on 12/7     Concern for Pneumonia  -Asymptomatic  -CT of chest not consistent with septic emboli  -Respiratory biofire negative  -Covid negative   -Procalcitonin wnl  -Doxycyline 100mg  BID for 7 days  -CT chest not concerning  for septic emboli  -TTE pending    AKI-slightly worse today  -Unknown baseline  -FeNa indicating pre-renal  -Encouraging PO intake and  IVF   -Will continue to monitor     DM Foot Wound with Concern for Osteomyelitis  -Xray and nuclear medicine imaging concerning for osteomyelitis  -Consulting podiatry   -No surgical intervention at this time  -On vancomycin, cefepime, flagyl - likely will need long term course  -Wound care consulted and appreciate recommendations   -Podiatry follow up as outpatient for DM shoe fitting   -MNT consult  - once clarify plan likely need PICC line and Infectious Disease input    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  -Starting gabapentin 400mg  TID (on 1200 TID at home) given concomitant pain management during admission  -Will adjust as necessary  -DM education consult once have established what insulin regimen he will go home on    Incidental Lung Nodule Found on CT  -Recommend repeat imaging in 6-12 months to follow this    Constipation-Improved  -BM 12/4  -Continue Miralax and Senakot    Question of [facility]  -We currently believe he has [facility]  -Past paperwork suggesting he was deemed to not have capacitance   -Will consult psychiatry on Monday for assistance  -Currently complying with all medical care    DVT/PE Prophylaxis: Heparin    Disposition Planning: TBD  current recommendations are IP.me    Shane Crutch., MD  08/15/2019, 13:57

## 2019-08-15 NOTE — Care Plan (Signed)
Antibiotics IV cefepime and vancomycin and PO doxycycline and flagyl maintained. Will need long term abx. Oxycodone Q6H PRN for pain. No c/o itching tonight. TTE ordered.     Problem: Adult Inpatient Plan of Care  Goal: Plan of Care Review  Outcome: Ongoing (see interventions/notes)  Goal: Patient-Specific Goal (Individualized)  Outcome: Ongoing (see interventions/notes)  Goal: Absence of Hospital-Acquired Illness or Injury  Outcome: Ongoing (see interventions/notes)  Goal: Optimal Comfort and Wellbeing  Outcome: Ongoing (see interventions/notes)  Goal: Rounds/Family Conference  Outcome: Ongoing (see interventions/notes)     Problem: Depression  Goal: Improved Mood  Outcome: Ongoing (see interventions/notes)     Problem: Fall Injury Risk  Goal: Absence of Fall and Fall-Related Injury  Outcome: Ongoing (see interventions/notes)     Problem: Wound  Goal: Optimal Wound Healing  Outcome: Ongoing (see interventions/notes)     Problem: Infection  Goal: Infection Symptom Resolution  Outcome: Ongoing (see interventions/notes)     Problem: Mobility Impairment  Goal: Optimal Mobility  Outcome: Ongoing (see interventions/notes)     Problem: Skin Injury Risk Increased  Goal: Skin Health and Integrity  Outcome: Ongoing (see interventions/notes)

## 2019-08-15 NOTE — Pharmacy (Signed)
[facility] / Department of Pharmaceutical Services  Therapeutic Drug Monitoring: Vancomycin  08/15/2019      Patient name: [name] [name], [name] [name]  Date of Birth:  06/13/1961    Actual Weight:  Weight: 84.4 kg (186 lb 1.1 oz) (08/11/19 2100)     BMI:  BMI (Calculated): 25.64 (08/11/19 2100)    Date RPh Current regimen (including mg/kg) Indication Target Levels (mcg/mL) SCr (mg/dL) CrCl* (mL/min) Measured level (mcg/mL) Plan (including when levels are due) Comments   12/2 VK        Received Vanc 1500 mg on 12/1 @ 1500 and CTX 2g on 12/1@1730  and 12/2 @1200  from OSH   08/11/19 LL Initiating. Skin/soft tissue 10-15 1.43 60.2 - Vancomycin 1,750mg  (20 mg/kg actual BW) IV q24hr. Please obtain trough before 4th dose (on 12/4).    12/4 AG Vanc 1750mg  (20 mg/kg) every 24 hours.  SSTI  10-15 1.18 72.9 19.7, drawn appropriately Will adjust dose to 1250mg  starting 12/6 am as the current dose for this evening has already been infused.    12/6 [name] [name] vancomycin 1250 mg IV given this morning at 0500 DFI and likely osteo 10-15 1.36 63  Given trough on higher end on previous regimen (above) and bump in SCr today, will discontinue scheduled vancomycin order and will place intermittent order    Will order vancomycin random for 12/7 AM labs (~24 hours post x1 1250 mg dose)    May be able to schedule regimen vs continue to dose by levels, depending on SCr trend                                           *Creatinine clearance is estimated by using the Cockcroft-Gault equation for adult patients and the Carol Ada for pediatric patients.    The decision to discontinue vancomycin therapy will be determined by the primary service.  Please contact the pharmacist with any questions regarding this patient's medication regimen.

## 2019-08-16 ENCOUNTER — Inpatient Hospital Stay (HOSPITAL_COMMUNITY): Payer: Medicare PPO

## 2019-08-16 DIAGNOSIS — B9562 Methicillin resistant Staphylococcus aureus infection as the cause of diseases classified elsewhere: Secondary | ICD-10-CM

## 2019-08-16 DIAGNOSIS — F10929 Alcohol use, unspecified with intoxication, unspecified: Secondary | ICD-10-CM

## 2019-08-16 DIAGNOSIS — I361 Nonrheumatic tricuspid (valve) insufficiency: Secondary | ICD-10-CM

## 2019-08-16 LAB — BASIC METABOLIC PANEL
ANION GAP: 7 mmol/L (ref 4–13)
BUN/CREA RATIO: 13 (ref 6–22)
BUN: 19 mg/dL (ref 8–25)
CALCIUM: 8.7 mg/dL (ref 8.5–10.2)
CHLORIDE: 110 mmol/L (ref 96–111)
CO2 TOTAL: 21 mmol/L — ABNORMAL LOW (ref 22–32)
CREATININE: 1.41 mg/dL — ABNORMAL HIGH (ref 0.62–1.27)
ESTIMATED GFR: 54 mL/min/{1.73_m2} — ABNORMAL LOW (ref >60)
GLUCOSE: 165 mg/dL — ABNORMAL HIGH (ref 65–139)
POTASSIUM: 3.9 mmol/L (ref 3.5–5.1)
SODIUM: 138 mmol/L (ref 136–145)

## 2019-08-16 LAB — CBC WITH DIFF
BASOPHIL #: <0.1 10*3/uL (ref <=0.20)
BASOPHIL %: 1 %
EOSINOPHIL #: 0.21 10*3/uL (ref <=0.50)
EOSINOPHIL %: 3 %
HCT: 29.9 % — ABNORMAL LOW (ref 38.9–52.0)
HGB: 9.8 g/dL — ABNORMAL LOW (ref 13.4–17.5)
IMMATURE GRANULOCYTE #: 0.11 10*3/uL — ABNORMAL HIGH (ref <0.10)
IMMATURE GRANULOCYTE %: 2 % — ABNORMAL HIGH (ref 0–1)
LYMPHOCYTE #: 1.29 10*3/uL (ref 1.00–4.80)
LYMPHOCYTE %: 20 %
MCH: 29.6 pg (ref 26.0–32.0)
MCHC: 32.8 g/dL (ref 31.0–35.5)
MCV: 90.3 fL (ref 78.0–100.0)
MONOCYTE #: 0.56 10*3/uL (ref 0.20–1.10)
MONOCYTE %: 9 %
MPV: 10.3 fL (ref 8.7–12.5)
NEUTROPHIL #: 4.32 10*3/uL (ref 1.50–7.70)
NEUTROPHIL %: 65 %
PLATELETS: 205 10*3/uL (ref 150–400)
RBC: 3.31 10*6/uL — ABNORMAL LOW (ref 4.50–6.10)
RDW-CV: 12.8 % (ref 11.5–15.5)
WBC: 6.6 10*3/uL (ref 3.7–11.0)

## 2019-08-16 LAB — MAGNESIUM: MAGNESIUM: 1.6 mg/dL (ref 1.6–2.6)

## 2019-08-16 LAB — POC BLOOD GLUCOSE (RESULTS)
GLUCOSE, POC: 151 mg/dL — ABNORMAL HIGH (ref 70–105)
GLUCOSE, POC: 219 mg/dL — ABNORMAL HIGH (ref 70–105)
GLUCOSE, POC: 234 mg/dL — ABNORMAL HIGH (ref 70–105)
GLUCOSE, POC: 228 mg/dL — ABNORMAL HIGH (ref 70–105)

## 2019-08-16 LAB — PHOSPHORUS: PHOSPHORUS: 2.9 mg/dL (ref 2.4–4.7)

## 2019-08-16 LAB — VANCOMYCIN, RANDOM: VANCOMYCIN RANDOM: 19.2 ug/mL

## 2019-08-16 LAB — CREATINE KINASE (CK), TOTAL, SERUM OR PLASMA: CREATINE KINASE: 13 U/L — ABNORMAL LOW (ref 45–225)

## 2019-08-16 LAB — ADULT ROUTINE BLOOD CULTURE, SET OF 2 BOTTLES (BACTERIA AND YEAST)
BLOOD CULTURE, ROUTINE: NO GROWTH
BLOOD CULTURE, ROUTINE: NO GROWTH

## 2019-08-16 MED ORDER — OXYCODONE 5 MG TABLET
5.00 mg | ORAL_TABLET | Freq: Three times a day (TID) | ORAL | Status: DC | PRN
Start: 2019-08-16 — End: 2019-08-17

## 2019-08-16 MED ORDER — SODIUM CHLORIDE 0.9 % INTRAVENOUS SOLUTION
6.00 mg/kg | INTRAVENOUS | Status: DC
Start: 2019-08-16 — End: 2019-08-17
  Administered 2019-08-16: 500 mg via INTRAVENOUS
  Administered 2019-08-16: 20:00:00 0 mg via INTRAVENOUS
  Filled 2019-08-16: qty 10

## 2019-08-16 MED ORDER — VANCOMYCIN 1 GRAM/200 ML IN DEXTROSE 5 % INTRAVENOUS PIGGYBACK
12.0000 mg/kg | INJECTION | Freq: Once | INTRAVENOUS | Status: DC
Start: 2019-08-16 — End: 2019-08-16
  Filled 2019-08-16: qty 200

## 2019-08-16 MED ORDER — MAGNESIUM SULFATE 4 GRAM/100 ML (4 %) IN WATER INTRAVENOUS PIGGYBACK
4.0000 g | INJECTION | Freq: Once | INTRAVENOUS | Status: AC
Start: 2019-08-16 — End: 2019-08-16
  Administered 2019-08-16: 4 g via INTRAVENOUS
  Administered 2019-08-16: 0 g via INTRAVENOUS
  Filled 2019-08-16: qty 100

## 2019-08-16 MED ORDER — INSULIN GLARGINE (U-100) 100 UNIT/ML SUBCUTANEOUS SOLUTION
14.00 [IU] | Freq: Every evening | SUBCUTANEOUS | Status: DC
Start: 2019-08-16 — End: 2019-08-17
  Administered 2019-08-16: 20:00:00 14 [IU] via SUBCUTANEOUS
  Filled 2019-08-16: qty 14

## 2019-08-16 MED ORDER — OXYCODONE 5 MG TABLET
10.00 mg | ORAL_TABLET | Freq: Three times a day (TID) | ORAL | Status: DC | PRN
Start: 2019-08-16 — End: 2019-08-17
  Administered 2019-08-16 – 2019-08-17 (×3): 10 mg via ORAL
  Filled 2019-08-16 (×3): qty 2

## 2019-08-16 MED ORDER — SODIUM CHLORIDE 0.9 % INTRAVENOUS SOLUTION
6.00 mg/kg | INTRAVENOUS | Status: DC
Start: 2019-08-16 — End: 2019-08-16

## 2019-08-16 MED ORDER — NYSTATIN 100,000 UNIT/GRAM TOPICAL CREAM
TOPICAL_CREAM | Freq: Two times a day (BID) | CUTANEOUS | Status: DC
Start: 2019-08-16 — End: 2019-08-17
  Administered 2019-08-16 – 2019-08-17 (×3): via TOPICAL
  Filled 2019-08-16: qty 15

## 2019-08-16 NOTE — Care Plan (Signed)
Problem: Adult Inpatient Plan of Care  Goal: Plan of Care Review  Outcome: Ongoing (see interventions/notes)  Goal: Patient-Specific Goal (Individualized)  Outcome: Ongoing (see interventions/notes)  Goal: Absence of Hospital-Acquired Illness or Injury  Outcome: Ongoing (see interventions/notes)  Goal: Optimal Comfort and Wellbeing  Outcome: Ongoing (see interventions/notes)  Goal: Rounds/Family Conference  Outcome: Ongoing (see interventions/notes)     Problem: Depression  Goal: Improved Mood  Outcome: Ongoing (see interventions/notes)     Problem: Fall Injury Risk  Goal: Absence of Fall and Fall-Related Injury  Outcome: Ongoing (see interventions/notes)     Problem: Wound  Goal: Optimal Wound Healing  Outcome: Ongoing (see interventions/notes)     Problem: Infection  Goal: Infection Symptom Resolution  Outcome: Ongoing (see interventions/notes)     Problem: Mobility Impairment  Goal: Optimal Mobility  Outcome: Ongoing (see interventions/notes)     Problem: Skin Injury Risk Increased  Goal: Skin Health and Integrity  Outcome: Ongoing (see interventions/notes)

## 2019-08-16 NOTE — Care Management Notes (Addendum)
[facility] Management Note    Patient Name: [name] [name]  Date of Birth: [birth year]  Sex: male  Date/Time of Admission: 08/11/2019  5:44 PM  Room/Bed: 12/A  Payor: [place] MEDICARE / Plan: [place] MEDICARE ADVANTAGE PPO / Product Type: PPO /    LOS: 5 days   Primary Care Providers:  [facility] [place] (General)    Admitting Diagnosis:  Abscess of left periorbital region [H05.012]    Assessment:      08/16/19 0843   Assessment Details   Assessment Type Continued Assessment   Date of Care Management Update 08/16/19   Date of Next DCP Update 08/18/19   Care Management Plan   Discharge Planning Status plan in progress   Projected Discharge Date 08/19/19   Discharge Needs Assessment   Discharge Facility/Level of Care Needs Home vs Home with Home Health       Discharge Plan:  Home vs home with Home Health   Patient continues on IV ABX and pain control. ID, wound care and psych consulted. Ophthamology to perform DSG change on 12/7. Will follow Service for any discharge recommendations and assist as needed.  0935-Received a call from Service requesting [place] to verify [facility] agency, [place]. Called and spoke to [place] form [place] [phone]. Rodena Piety stated that the nurses go out to the patient's house twice a week. However, the patient's daughter does not change patient's dressing as agreed upon. The "dressing was so dry, my nurses had to soak the dressing to get it off ". Rodena Piety also stated the patient's daughter doesn't get out of bed until 2 or 3 pm in the afternoon. Service updated.    The patient will continue to be evaluated for developing discharge needs.     Case Manager: Breck Coons  Phone: [phone]

## 2019-08-16 NOTE — Pharmacy (Signed)
[facility] / Department of Pharmaceutical Services  Therapeutic Drug Monitoring: Vancomycin  08/16/2019      Patient name: [name] [name], [name] [name]  Date of Birth:  1961-02-08    Actual Weight:  Weight: 84.4 kg (186 lb 1.1 oz) (08/11/19 2100)     BMI:  BMI (Calculated): 25.64 (08/11/19 2100)    Date RPh Current regimen (including mg/kg) Indication Target Levels (mcg/mL) SCr (mg/dL) CrCl* (mL/min) Measured level (mcg/mL) Plan (including when levels are due) Comments   12/2 VK        Received Vanc 1500 mg on 12/1 @ 1500 and CTX 2g on 12/1@1730  and 12/2 @1200  from OSH   08/11/19 LL Initiating. Skin/soft tissue 10-15 1.43 60.2 - Vancomycin 1,750mg  (20 mg/kg actual BW) IV q24hr. Please obtain trough before 4th dose (on 12/4).    12/4 AG Vanc 1750mg  (20 mg/kg) every 24 hours.  SSTI  10-15 1.18 72.9 19.7, drawn appropriately Will adjust dose to 1250mg  starting 12/6 am as the current dose for this evening has already been infused.    12/6 Kara vancomycin 1250 mg IV given this morning at 0500 DFI and likely osteo 10-15 1.36 63  Given trough on higher end on previous regimen (above) and bump in SCr today, will discontinue scheduled vancomycin order and will place intermittent order    Will order vancomycin random for 12/7 AM labs (~24 hours post x1 1250 mg dose)    May be able to schedule regimen vs continue to dose by levels, depending on SCr trend     12/07 EMK Vancomycin intermittent dosing DFI (concern for OM) 10-15 1.41 61 19.2 (22-hour level) Administer vancomycin 1000 mg IV once this evening and check a vancomycin random level tomorrow morning                              *Creatinine clearance is estimated by using the Cockcroft-Gault equation for adult patients and the Carol Ada for pediatric patients.    The decision to discontinue vancomycin therapy will be determined by the primary service.  Please contact the pharmacist with any questions regarding this patient's medication regimen.

## 2019-08-16 NOTE — Progress Notes (Signed)
[facility]  Medicine Progress Note  Full Code    [name] [name]  Date of service: 08/16/2019    Subjective:   No acute events overnight. His eye is greatly improved today but he is still having blurred vision out of that eye. He denies chest pain, abdominal pain, shortness of breath, fevers, and chills. He has been compliant with treatment and seems to understand his clinical condition. He thinks his son is overly concerned and that he can make his own medical decisions but is agreeable to talk without our psychiatry team as a second opinion.    Vital Signs:  Temp (24hrs) Max:36.8 C (17.4 F)      Systolic (94WHQ), PRF:163 , Min:119 , WGY:659     Diastolic (93TTS), VXB:93, Min:72, Max:83    Temp  Avg: 36.7 C (98 F)  Min: 36.5 C (97.7 F)  Max: 36.8 C (98.2 F)  MAP (Non-Invasive)  Avg: 90 mmHG  Min: 89 mmHG  Max: 91 mmHG  Pulse  Avg: 62.3  Min: 59  Max: 65  Resp  Avg: 17.3  Min: 16  Max: 18  SpO2  Avg: 98 %  Min: 96 %  Max: 100 %  Pain Score (Numeric, Faces): 5  Fi02    I/O:  I/O last 24 hours:      Intake/Output Summary (Last 24 hours) at 08/16/2019 1244  Last data filed at 08/16/2019 0849  Gross per 24 hour   Intake 2070 ml   Output 1525 ml   Net 545 ml     I/O current shift:  12/07 0700 - 12/07 1859  In: 240 [P.O.:240]  Out: -   Blood Sugars: Last Fingerstick:  No results found for: GLUCOSEPOC      .  acetaminophen (TYLENOL) tablet, 650 mg, Oral, Q4H PRN    .  albuterol (PROVENTIL) 2.5 mg / 3 mL (0.083%) neb solution, 2.5 mg, Nebulization, Q4H PRN    .  cefepime (MAXIPIME) 2 g in NS 100 mL IVPB, 2 g, Intravenous, Q12H    .  doxycycline tablet, 100 mg, Oral, 2x/day    .  gabapentin (NEURONTIN) capsule, 400 mg, Oral, 3x/day    .  heparin 5,000 unit/mL injection, 5,000 Units, Subcutaneous, Q8HRS    .  insulin glargine (LANTUS) 100 units/mL injection, 14 Units, Subcutaneous, NIGHTLY    .  insulin lispro (HUMALOG) 100 units/mL injection, 4 Units, Subcutaneous, 3x/day AC    .  melatonin tablet, 3 mg, Oral,  NIGHTLY    .  metroNIDAZOLE (FLAGYL) tablet, 500 mg, Oral, 3x/day    .  NS flush syringe, 2-6 mL, Intracatheter, Q8HRS    And    .  NS flush syringe, 2-6 mL, Intracatheter, Q1 MIN PRN    .  nystatin (MYCOSTATIN) 100,000 units/g topical cream, , Apply Topically, 2x/day    .  oxyCODONE (ROXICODONE) immediate release tablet, 5 mg, Oral, Q6H PRN    .  oxyCODONE (ROXICODONE) immediate release tablet, 10 mg, Oral, Q6H PRN    .  perflutren lipid microspheres (DEFINITY) 1.3 mL in NS 10 mL (tot vol) injection, 2 mL, Intravenous, Give in Cardiology    .  polyethylene glycol (MIRALAX) oral packet, 17 g, Oral, Daily    .  sennosides-docusate sodium (SENOKOT-S) 8.6-50mg  per tablet, 1 Tab, Oral, 2x/day    .  SSIP insulin lispro (HUMALOG) 100 units/mL injection, 0-12 Units, Subcutaneous, 4x/day PRN    .  vancomycin (VANCOCIN) 1 g in D5W 200 mL premix IVPB,  12 mg/kg (Adjusted), Intravenous, Once    .  Vancomycin IV - Pharmacist to Dose per Protocol, , Does not apply, Daily PRN    .  Vancomycin IV Intermittent Dosing, , Does not apply, Daily PRN        No Known Allergies    Physical Exam:   Constitutional: appears in no distress, resting in bed comfortably  Eyes: right eye pupil is equal round and reactive to light, redness and swelling around left eye greatly improved, dressing removed today, patient can open left eye and the pupil is equal round and reactive to light   Head: Seborrhea noted on scalp, around ears, and in beard    ENT: Mouth mucous membranes moist.   Neck: trachea midline, no JVD  Respiratory: Clear to ascultation bilaterally  Cardiovascular: regular rate and rhythm, S1, S2 normal, no murmur, click, rub or gallop. No s3/s4  Gastrointestinal: Soft, non-tender, Bowel sounds normal  Genitourinary: Deferred  Musculoskeletal: Head atraumatic and normocephalic, no edema noted  Integumentary:  Skin warm and dry and skin lesion over left eye/face, Rooke boots in place, trace pedal edema and ankles  Neurologic: Alert and  oriented x3, no tremor, strength 4/5 in bilateral ankles and hand grip that is stable from past examination  Psychiatric: Affect Normal  [place]: Eye opening: 4 spontaneous, Verbal resonse:  5 oriented, Best motor response:  6 obeys commands      Labs:  Lab Results Today:    Results for orders placed or performed during the hospital encounter of 08/11/19 (from the past 24 hour(s))   C-REACTIVE PROTEIN(CRP),INFLAMMATION   Result Value Ref Range    CRP INFLAMMATION 36.4 (H) <=8.0 mg/L   POC BLOOD GLUCOSE (RESULTS)   Result Value Ref Range    GLUCOSE, POC 147 (H) 70 - 105 mg/dl   POC BLOOD GLUCOSE (RESULTS)   Result Value Ref Range    GLUCOSE, POC 248 (H) 70 - 105 mg/dl   VANCOMYCIN, RANDOM   Result Value Ref Range    VANCOMYCIN RANDOM 19.2   ug/mL   BASIC METABOLIC PANEL   Result Value Ref Range    SODIUM 138 136 - 145 mmol/L    POTASSIUM 3.9 3.5 - 5.1 mmol/L    CHLORIDE 110 96 - 111 mmol/L    CO2 TOTAL 21 (L) 22 - 32 mmol/L    ANION GAP 7 4 - 13 mmol/L    CALCIUM 8.7 8.5 - 10.2 mg/dL    GLUCOSE 165 (H) 65 - 139 mg/dL    BUN 19 8 - 25 mg/dL    CREATININE 1.41 (H) 0.62 - 1.27 mg/dL    BUN/CREA RATIO 13 6 - 22    ESTIMATED GFR 54 (L) >60 mL/min/1.37m^2   MAGNESIUM   Result Value Ref Range    MAGNESIUM 1.6 1.6 - 2.6 mg/dL   PHOSPHORUS   Result Value Ref Range    PHOSPHORUS 2.9 2.4 - 4.7 mg/dL   CBC WITH DIFF   Result Value Ref Range    WBC 6.6 3.7 - 11.0 x10^3/uL    RBC 3.31 (L) 4.50 - 6.10 x10^6/uL    HGB 9.8 (L) 13.4 - 17.5 g/dL    HCT 29.9 (L) 38.9 - 52.0 %    MCV 90.3 78.0 - 100.0 fL    MCH 29.6 26.0 - 32.0 pg    MCHC 32.8 31.0 - 35.5 g/dL    RDW-CV 12.8 11.5 - 15.5 %    PLATELETS 205 150 - 400 x10^3/uL  MPV 10.3 8.7 - 12.5 fL    NEUTROPHIL % 65 %    LYMPHOCYTE % 20 %    MONOCYTE % 9 %    EOSINOPHIL % 3 %    BASOPHIL % 1 %    NEUTROPHIL # 4.32 1.50 - 7.70 x10^3/uL    LYMPHOCYTE # 1.29 1.00 - 4.80 x10^3/uL    MONOCYTE # 0.56 0.20 - 1.10 x10^3/uL    EOSINOPHIL # 0.21 <=0.50 x10^3/uL    BASOPHIL # <0.10 <=0.20  x10^3/uL    IMMATURE GRANULOCYTE % 2 (H) 0 - 1 %    IMMATURE GRANULOCYTE # 0.11 (H) <0.10 x10^3/uL   POC BLOOD GLUCOSE (RESULTS)   Result Value Ref Range    GLUCOSE, POC 151 (H) 70 - 105 mg/dl   POC BLOOD GLUCOSE (RESULTS)   Result Value Ref Range    GLUCOSE, POC 219 (H) 70 - 105 mg/dl       Radiology:    CXR 12/4:  IMPRESSION:  There is mild interstitial prominence within the lungs  bilaterally, right greater than left increased from prior study and  concerning for infectious process. There is no evidence for pleural  effusion or pulmonary edema.    Nuclear Imaging of Left Foot  IMPRESSION:     1. Findings concerning for osteomyelitis of the inferior left calcaneus  given radiotracer uptake and erosive bony changes. Soft tissue ulceration  with radiotracer uptake at this location is compatible with cellulitis.  2. Focal soft tissue radiotracer uptake superior to the left proximal first  phalange with extension of radiotracer into adjacent bone, findings raise  concern for cellulitis and osteomyelitis. The proximal phalange of the left  first toe demonstrates bony changes consistent with prior fracture of  uncertain chronicity.  3. Generalized hyperemia of the visualized lower left extremity especially  involving the foot, findings are compatible with cellulitis    CT Chest:  IMPRESSION:  1.  Mild subsegmental atelectasis at bilateral lung bases with trace  pleural effusion.  2.  No suspicious cavitary lung nodules to suggest septic emboli. No focal  consolidation or abnormal groundglass opacities.  3.  Small solid nodule along the left fissure measuring 7 mm is  nonspecific. Recommend reassessment of this nodule in 6-12 months.    Microbiology:  urine streptococcus and legionella antigen negative, respiratory biofire negative, blood cultures no growth, sterile site culture MRSA (sensitivities below)   Ciprofloxacin >=8 mcg/mL Resistant     Clindamycin >=4 mcg/mL Resistant     Daptomycin 0.5 mcg/mL Sensitive      Doxycycline <=0.5 mcg/mL Sensitive     Erythromycin >=8 mcg/mL Resistant     Gentamicin <=0.5 mcg/mL Sensitive1     Levofloxacin 4 mcg/mL Resistant     Linezolid 2 mcg/mL Sensitive     Oxacillin >=4 mcg/mL Resistant     Rifampin <=0.5 mcg/mL Sensitive2     Tetracycline <=1 mcg/mL Sensitive     Tigecycline <=0.12 mcg/mL Sensitive     Trimethoprim/Sulfamethoxazole <=10 mcg/mL Sensitive     Vancomycin 1 mcg/mL Sensitive          PT/OT: Yes    Consults: Ophthalmology, Wound Care, Podiatry, MNT, DM education, ID, Psychiatry    Hardware (lines, foley's, tubes): PIV    Assessment/ Plan:   Rowin Bayron is a 58 year old male with a PMH of insulin dependent type 2 DM who presents as a transfer from and outside facility for periorbital abscess requiring drainage and IV antibiotics. No longer febrile or  tachycardic on current IV antibiotics and is otherwise stable on floor status.  He participates well with the treatment plan at this point and appears to have decision-making capacity.  He will likely will need long-term antibiotics for his osteomyelitis in his feet. AKI worsening today possible related to vancomycin. Consulting ID and psychiatry for assistance today.  Active Hospital Problems    Diagnosis   . Preseptal cellulitis of left eye   . Sepsis due to methicillin resistant Staphylococcus aureus (MRSA) without acute organ dysfunction (CMS HCC)   . Abscess of left periorbital region     Preseptal Cellulitis with Abscess growing MRSA at outside facility  -Normothermic, not tachycardic  -Pain control with tylenol, oxy 5 and oxy 10 Q6hr PRN  -CRP 210 inially and downtrending  -Continue Cefepime, Flagyl given concomitant DM osteomyelitis  -Switched from vancomycin to daptomycin given AKI with vancomycin and no orbital involvement for a 2 week course                 -Was on vancomycin and ceftriaxone at outside facility   -Blood cultures no growth here and at outside facility  -Cultures from outside facility was clindamycin  resistant and vancomycin sensitive (see media for report and see above for our sensitivities)  -ID consult for antibiotic assistance given multiple infections and AKI possibly related to vancomycin  -Ophthalmology performed I&D at bedside on day of admission and are following (appreciate assistance)              -Further evaluation for glaucoma, right eye nevus, and NPDR as outpatient   -ophthalmology removed dressing 12/7 and recommend keeping wound open with saline rinses with perilesional massage/warm compresses to facilitate expression and erythromycin ophthalmic ointment TID    AKI-slightly worse today  -Unknown baseline  -FeNa indicating pre-renal on initial evaluation but worsened with IVF yesterday so concern that this is related to vancomycin  -Encouraging PO intake and consulting ID for assistance with antibiotics in hopes to switch off of vancomycin  -Will continue to monitor     DM Foot Wound with Concern for Osteomyelitis  -Xray and nuclear medicine imaging concerning for osteomyelitis  -Consulting podiatry   -No surgical intervention at this time  -On daptomycin, cefepime, flagyl - likely will need long term course; awaiting podiatry recommendations for duration  -Wound care consulted and appreciate recommendations   -Podiatry follow up as outpatient for DM shoe fitting   -MNT consult  -ID consulted and appreciate assistance  - once clarify plan likely need PICC line    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   -Hemoglobin A1c 8.8  -Holding home regimen (Lantus 20 units nightly and unclear short acting insulin regimen)  -Starting SSI with QID POC checks  -Glargine 14 units and lispro 4 unit TID with meals  -Starting gabapentin 400mg  TID (on 1200 TID at home) given concomitant pain management during admission  -DM education consulted    Seborrheic Dermatitis  -No antifungal shampoo so started on nystatin cream BID  -HIV negative    Normocytic Anemia  -Anemia of chronic disease on iron  panel  -Continue to monitor    Incidental Lung Nodule Found on CT  -Recommend repeat imaging in 6-12 months to follow this    Constipation-Improved  -BM 12/4  -Continue Miralax and Senakot    Concern for Pneumonia-Resolved  -Asymptomatic  -CT of chest not consistent with septic emboli or concern for pneumonia  -Respiratory biofire negative and Covid negative   -Procalcitonin  WNL  -Per ID no need as this is not pneumonia based on CT  -Doxycyline 100mg  discontinued  -TTE pending    Question of [place]  -We currently believe he has [facility]  -Past paperwork suggesting he was deemed to not have capacitance   -Consulted psychiatry for assistance with appropriate course of action given paperwork and our clinical assessment  -Currently complying with all medical care    DVT/PE Prophylaxis: Heparin    Disposition Planning: TBD     Leanor Kail, MD        I saw and examined the patient.  I reviewed the resident's note.  I agree with the findings and plan of care as documented in the resident's note.  Any exceptions/additions are edited/noted.    Shane Crutch., MD

## 2019-08-16 NOTE — Nurses Notes (Signed)
Patient alert and oriented x4. Patient reports 7/10 eye pain, PRN Oxycodone given per the [facility] along with evening medications. IV abx therapy cont. Assessment completed as documented. Rooke boots on. Bilateral heel dressings changed per the order. Call bell within reach.

## 2019-08-16 NOTE — Nurses Notes (Deleted)
Pt back on unit.

## 2019-08-16 NOTE — Care Plan (Signed)
[place]  Physical Therapy Initial Evaluation    Patient Name: [name] [name]  Date of Birth: 03-06-1961  Height: Height: 181.6 cm (5' 11.5")  Weight: Weight: 84.4 kg (186 lb 1.1 oz)  Room/Bed: 12/A  Payor: [employer] MEDICARE / Plan: [place] MEDICARE ADVANTAGE PPO / Product Type: PPO /     Assessment:      [name] [name] tolerated PT evaluation poorly this date.  He presents with impaired strength, balance, mobility, and endurance.  Requires hands-on assistance for out of bed mobility.  Recommend ongoing PT at a SNF following hospital d/c.    Discharge Needs:   Equipment Recommendation: TBD  Discharge Disposition: skilled nursing facility    Plan:   Current Intervention: balance training, bed mobility training, gait training, transfer training  To provide physical therapy services 1x/day, minimum of 2x/week until goals are met.    The risks/benefits of therapy have been discussed with the patient/caregiver and he/she is in agreement with the established plan of care.       Subjective & Objective        08/16/19 1045   Therapist Pager   PT Assigned/ Pager # Elveria Rising [phone]   Rehab Session   Document Type evaluation   Total PT Minutes: 10   Patient Effort fair   Symptoms Noted During/After Treatment fatigue   General Information   Patient Profile Reviewed yes   Pertinent History of Current Functional Problem [name] [name] is a 58 y.o., White male with a PMH of insulin dependent type 2 DM who presents form outside facility for concerns of preseptal cellulitis and abscess of left eye. Also with L foot non-healing wound and likely osteomyelitis   Medical Lines PIV Line   Respiratory Status room air   Existing Precautions/Restrictions contact isolation;fall precautions   Mutuality/Individual Preferences   Individualized Care Needs up to chair with A x1-2   Living Environment   Lives With child(ren), adult   Living Arrangements house   Home Assessment: No Problems Identified   Home Accessibility no  concerns   Functional Level Prior   Ambulation 3 - assistive equipment and person   Transferring 3 - assistive equipment and person   Toileting 3 - assistive equipment and person   Bathing 2 - assistive person   Dressing 1 - assistive equipment   Prior Functional Level Comment Reports he is minimally ambulatory at home   Pre Treatment Status   Pre Treatment Patient Status Patient supine in bed   Support Present Pre Treatment  None   Communication Pre Treatment  Nurse   Communication Pre Treatment Comment agreeable to PT   Cognitive Assessment/Interventions   Behavior/Mood Observations alert;cooperative;flat affect   Orientation Status oriented x 4   Attention WNL/WFL   Follows Commands WNL   Pain Assessment   Pre/Posttreatment Pain Comment c/o foot pain but did not rate   RUE Assessment   RUE Assessment   (shoulder flex limited to 90 degrees)   LUE Assessment   LUE Assessment   (shoulder flex limited to 100 degrees)   RLE Assessment   RLE Assessment   (grossly 3/5)   LLE Assessment   LLE Assessment   (grossly 3/5)   Bed Mobility Assessment/Treatment   Bed Mobility, Assistive Device bed rails;Head of Bed Elevated   Supine-Sit Independence contact guard assist   Sit to Supine, Independence contact guard assist   Transfer Assessment/Treatment   Sit-Stand Independence minimum assist (75% patient effort)   Stand-Sit Independence  minimum assist (75% patient effort)   Transfer Comment tolerated standing briefly with hand-held assist, crouched posture with knee/hip flexion.  Returned to sitting after ~10 seconds and declined further attempts at standing   Gait Assessment/Treatment   Comment pt declined to attempt   Balance Skill Training   Sitting Balance: Static fair balance   Sitting, Dynamic (Balance) fair - balance   Sit-to-Stand Balance poor balance   Standing Balance: Static poor balance   Standing Balance: Dynamic poor balance   Systems Impairment Contributing to Balance Disturbance  musculoskeletal;neuromuscular;somatosensory   Identified Impairments Contributing to Balance Disturbance pain;impaired postural control;decreased strength;decreased sensation   Therapeutic Exercise/Activity   Comment shoulder flex, biceps curls, hip flex, knee extension x10 each bilat   Orthotics/Misc Device    Boots Rooke Boots;On   Post Treatment Status   Post Treatment Patient Status Patient supine in bed;Call light within reach;Sitter select activated   Support Present Post Treatment  None   Plan of Care Review   Plan Of Care Reviewed With patient   Basic Mobility Am-PAC/6Clicks Score   Turning in bed without bedrails 4   Lying on back to sitting on edge of flat bed 4   Moving to and from a bed to a chair 2   Standing up from chair 3   Walk in room 2   Climbing 3-5 steps with railing 1   6 Clicks Raw Score total 16   Standardized (t-scale) score 38.32   CMS 0-100% Score 47.12   CMS Modifier CK   Patient Mobility Goal (JHHLM) 6- Walk 10 steps or more 2X/day   Exercise/Activity Level Performed 3- Sat at edge of bed   Physical Therapy Clinical Impression   Assessment [name] [name] tolerated PT evaluation poorly this date.  He presents with impaired strength, balance, mobility, and endurance.  Requires hands-on assistance for out of bed mobility.  Recommend ongoing PT at a SNF following hospital d/c.   Rehab Potential fair, will monitor progress closely   Therapy Frequency 1x/day;minimum of 2x/week   Predicted Duration of Therapy Intervention (days/wks) until goals are met   Anticipated Equipment Needs at Discharge (PT) TBD   Anticipated Discharge Disposition skilled nursing facility   Evaluation Complexity Justification   Patient History: Co-morbidity/factors that impact Plan of Care Complicated prior level of function/decline in function;One or more other medical co-morbidity   Examination Components Range of motion;Strength;Balance;Bed mobility;Transfers;Ambulation   Presentation Evolving: Symptoms, complaints,  characteristics of condition changing &/or cognitive deficits present   Clinical Decision Making Moderate complexity   Evaluation Complexity Moderate complexity   Care Plan Goals   PT Rehab Goals Bed Mobility Goal;Gait Training Goal;Transfer Training Goal   Bed Mobility Goal   Bed Mobility Goal, Date Established 08/16/19   Bed Mobility Goal, Time to Achieve by discharge   Bed Mobility Goal, Activity Type all bed mobility activities   Bed Mobility Goal, Independence Level independent   Gait Training  Goal, Distance to Achieve   Gait Training  Goal, Date Established 08/16/19   Gait Training  Goal, Time to Achieve by discharge   Gait Training  Goal, Independence Level modified independence   Gait Training  Goal, Assist Device least restricted assistive device   Gait Training  Goal, Distance to Achieve 25'   Transfer Training Goal   Transfer Training Goal, Date Established 08/16/19   Transfer Training Goal, Time to Achieve by discharge   Transfer Training Goal, Activity Type all transfers   Transfer Training Goal, Independence Level modified  independence   Training and development officer Goal, Assist Device least restrictrictive assistive device   Planned Therapy Interventions, PT Eval   Planned Therapy Interventions (PT) balance training;bed mobility training;gait training;transfer training       Therapist:   Jannette Fogo, PT   Pager #: [phone]
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
## 2019-08-16 NOTE — Consults (Signed)
[facility] Medicine  Psychiatry Decision Making Consult    [name] [name]  DOB:  05-24-1961  MRN:  [id number]  Date of Service:  08/16/2019  Primary Service:  MEDICINE 5  Requesting Attending:  Shane Crutch.*    Reason for Consult:  Assess Medical Decision Making Capacity    Assessment:    [name] [name] has medical decsion making capacity. [name] [name] is time and task dependent and should be reassessed frequently.     Recommendations:  -Patient has decision making capacity from our evaluation today   -No indication for ativan taper or CIWA at this time   -Patient does not wish for long term treatment for alcohol use disorder  -Please page for any further questions/concerns, will staff in the AM     HPI:   [name] [name] is a 58 y.o. male with PMH of long standing insulin dependant DMII who is currently admitted in the medicine service for cellulitis and concern for osteomyelitis. Currently being treated for preseptal cellulitis left eye with abscess growing MRSA and DM foot wound. He was transferred from outside facility for further management. He is currently on broad spectrum IV antibiotics, has ophthalmology, ID and wound care on board with his treatment, has been admitted in [facility] for the last 5 days.   Patient has had prior documentation from a hospitalization from June this year at outside facility where he was reported to lack [facility]. On primary team evaluation during this admission, he had [facility] on their exam. Psychiatry was consulted for [facility] evaluation.      During my visit, patient was able to provide history of why he was here and what treatment he is currently receiving and rationalize his decisions. Patient states that he lives with his daughter prior to hospitalisation. States he moved with his daughter after his girlfriend died few years ago, he had also stayed with his son for a year prior to moving back with his daughter. He states she helps him with his insulin and his dressing changes, has home  health come in twice a week as well.     Regarding substance use, patient states that he has history of alcohol use since he was in his late teens.  He states he usually drinks around 2 beers daily, finishes a 12 pack in a week. Denies any history of complicated withdrawal, never had alcohol withdrawal seizures per patient. He states he has had ativan given to him in prior hospitalizations at [facility] for infection, but denies ever being admitted in rehab for detox or any psychiatry facility. Currently he has been in the hospital for week (including time at outside facility) per patient, he has not received any ativan and has tolerated well.  Regarding quitting alcohol, patient states he does not wish to stop drinking. He states he just drinks 2 beers daily since he likes beer and does not think he needs to quit since he feels it is not that much, does not wish for any medication or inpatient/outpatient treatment.      He states he used to smoke cigars, but denies any tobacco use in the last year. Patient states that he used to be prescribed valium more than 5 years ago for anxiety, but not used any BZD since apart from the ativan given in prior hospitalizations. Regarding opiate use, patient states he has prior been given intermittently during hospitalizations for pain. At home he occasionally used a tablet of Lortab for pain(maybe once a month) which he obtains  from his 'friends/associates'. Denies any IVDU or stimulant use.  He is prescribed Gabapentin 1200 TID for diabetic neuropathy, last fill date 07/14/2019.     Patient does endorse long standing history of anxiety and depression which he states is stable. He currently denies any SI/HI, states he never had prior suicide attempts of psychiatric hospitalization. States had been on medications(he does not remember) more than 5 years ago when he had seen a psychiatrist, but quit taking them as he didn't want to take them.   He denies any AVH or paranoid  delusions.       Decision Making Questions:  1. The patient understands what is wrong with him, why he is hospitalized, and that he has a medical illness.  yes  2. The patient understands that he needs medical treatment, understands the benefits and risks of the proposed procedure or treatment and the benefits and risks of the alternative procedures or treatments, including non-treatment.  yes  3. The patient is able to explain why he made the health care decision that he did and the explanation is consistent with his stated values and wishes.  yes    MSE:  Appearance:  appears actual age and no apparent distress.  Level of Consciousness:  alert  Eye Contact:  good.    Motor:  no psychomotor retardation or agitation.    Speech:  normal rate and volume.    Behavior:  cooperative.    Orientation: grossly oriented   Attention:  good.    Memory:  grossly normal  Knowledge: Consistent with education  Thought Process:  linear.    Thought Content:  no paranoia or delusions.  Perception:  no hallucinations endorsed  Insight:  good.    Judgement:  good.    Suicidal Ideation:  none.    Homicidal Ideaton:  None expressed  Mood:  euthymic.    Afffect:  congruent to mood.       Marvia Pickles, MD  08/16/2019, 13:59      Addendum  Patient discussed with staff during rounds. Agree with recs as above. Psychiatry will sign off. Please call with questions or concerns.    Marvia Pickles, MD  08/17/2019, 09:23    I was present and participated in the development of the treatment plan. I saw and examined the patient on DOS.  I reviewed the resident's note.  I agree with the findings and plan of care as documented in the resident's note.  Any exceptions/additions are edited/noted.    Sheila Oats, MD 08/17/2019

## 2019-08-16 NOTE — Consults (Signed)
[facility]  OPHTHALMOLOGY Consult Follow Up    [name] [name], [name] [name], 58 y.o. male  Date of Birth:  Mar 26, 1961  Date of Service:  08/16/2019    F/U evalutation of:  Preseptal cellulitis, left eye    Subjective: Continues to have mild itching and discomfort on his left forehead. Denies vision changes including blurriness, pain with EOM, or vision loss.    Data:    Mood and Affect Normal:  Yes  Mental Status - Oriented to person, place, and time:  Yes    Visual Acuity:  sc  cc  ph  near    OD 20/50 +   20/30-      OS 20/50 +   20/30-         Intraocular Pressure: by [place]  OD:  19     OS:  19      Ocular Motility and Alignment:    normal OU    Visual fields by confrontation:    Normal OU     OD OS   Adnexa WNL Decreased purulent drainage from medial brow. Has packing in place connecting from medial eye brow to lateral nasal bridge with drainage.   Cornea WNL WNL   Anterior Chamber WNL WNL   Iris/pupils   WNL WNL   APD No No   Lens 2+ NS 2+ NS     Pupil Dilation:  not done     Labs/Images:  Hb 9.8    A/P:   58 y.o. with preseptal cellulitis. 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 IOP acceptable, no RAPD, color plates near full.     Recommendations:  -Outside culture with clindamycin-resistant MRSA per verbal report  -Culture result obtained here 08/11/19 with clindamycin-resistant MRSA  -Recommend ID consult given MDRO, immunosuppressed state, and concern for osteomyelitis, appreciate their recommendations  -Continue abx per primary team  -Recommend further evaluation of glaucoma and NPDR in an outpatient setting  - Iris nevus on right eye without concern for malignancy, recommend outpatient followup  -Dressing, iodoform packing removed today  -Recommend keeping wound open while drainage continues, saline rinses of wound bed with perilesional massage/warm compresses to facilitate expression. Erythromycin ophthalmic ointment to wounds TID  -Will continue follow  while inpatient    Laurey Morale, MD    I saw and examined the patient.  I reviewed the resident's note.  I agree with the findings and plan of care as documented in the resident's note.  Any exceptions/additions are edited/noted.  Delia Chimes, MD 08/16/2019, 12:27

## 2019-08-16 NOTE — Pharmacy (Signed)
[facility] / Department of Pharmaceutical Services  Therapeutic Drug Monitoring: Vancomycin  08/16/2019      Patient name: [name] [name], [name] [name]  Date of Birth:  [birth year]    Actual Weight:  Weight: 84.4 kg (186 lb 1.1 oz) (08/11/19 2100)     BMI:  BMI (Calculated): 25.64 (08/11/19 2100)    Date RPh Current regimen (including mg/kg) Indication Target Levels (mcg/mL) SCr (mg/dL) CrCl* (mL/min) Measured level (mcg/mL) Plan (including when levels are due) Comments   12/2 VK        Received Vanc 1500 mg on 12/1 @ 1500 and CTX 2g on 12/1@1730  and 12/2 @1200  from OSH   08/11/19 LL Initiating. Skin/soft tissue 10-15 1.43 60.2 - Vancomycin 1,750mg  (20 mg/kg actual BW) IV q24hr. Please obtain trough before 4th dose (on 12/4).    12/4 AG Vanc 1750mg  (20 mg/kg) every 24 hours.  SSTI  10-15 1.18 72.9 19.7, drawn appropriately Will adjust dose to 1250mg  starting 12/6 am as the current dose for this evening has already been infused.    12/6 Kara vancomycin 1250 mg IV given this morning at 0500 DFI and likely osteo 10-15 1.36 63  Given trough on higher end on previous regimen (above) and bump in SCr today, will discontinue scheduled vancomycin order and will place intermittent order    Will order vancomycin random for 12/7 AM labs (~24 hours post x1 1250 mg dose)    May be able to schedule regimen vs continue to dose by levels, depending on SCr trend     12/07 EMK Vancomycin intermittent dosing DFI (concern for OM) 10-15 1.41 61 19.2 (22-hour level) Administer vancomycin 1000 mg IV once this evening and check a vancomycin random level tomorrow morning    12/07 Dione Plover to Daptomycin                        *Creatinine clearance is estimated by using the Cockcroft-Gault equation for adult patients and the Carol Ada for pediatric patients.    The decision to discontinue vancomycin therapy will be determined by the primary service.  Please contact the pharmacist with any questions regarding this patient's  medication regimen.

## 2019-08-17 LAB — CBC WITH DIFF
BASOPHIL #: <0.1 10*3/uL (ref <=0.20)
BASOPHIL %: 1 %
EOSINOPHIL #: 0.19 10*3/uL (ref <=0.50)
EOSINOPHIL %: 3 %
HCT: 31.1 % — ABNORMAL LOW (ref 38.9–52.0)
HGB: 10.4 g/dL — ABNORMAL LOW (ref 13.4–17.5)
IMMATURE GRANULOCYTE #: 0.18 10*3/uL — ABNORMAL HIGH (ref <0.10)
IMMATURE GRANULOCYTE %: 3 % — ABNORMAL HIGH (ref 0–1)
LYMPHOCYTE #: 1.44 10*3/uL (ref 1.00–4.80)
LYMPHOCYTE %: 20 %
MCH: 30.1 pg (ref 26.0–32.0)
MCHC: 33.4 g/dL (ref 31.0–35.5)
MCV: 89.9 fL (ref 78.0–100.0)
MONOCYTE #: 0.53 10*3/uL (ref 0.20–1.10)
MONOCYTE %: 7 %
MPV: 10.3 fL (ref 8.7–12.5)
NEUTROPHIL #: 4.89 10*3/uL (ref 1.50–7.70)
NEUTROPHIL %: 66 %
PLATELETS: 247 10*3/uL (ref 150–400)
RBC: 3.46 10*6/uL — ABNORMAL LOW (ref 4.50–6.10)
RDW-CV: 12.9 % (ref 11.5–15.5)
WBC: 7.3 10*3/uL (ref 3.7–11.0)

## 2019-08-17 LAB — MAGNESIUM: MAGNESIUM: 1.9 mg/dL (ref 1.6–2.6)

## 2019-08-17 LAB — BASIC METABOLIC PANEL
ANION GAP: 7 mmol/L (ref 4–13)
BUN/CREA RATIO: 14 (ref 6–22)
BUN: 16 mg/dL (ref 8–25)
CALCIUM: 8.8 mg/dL (ref 8.5–10.2)
CHLORIDE: 109 mmol/L (ref 96–111)
CO2 TOTAL: 21 mmol/L — ABNORMAL LOW (ref 22–32)
CREATININE: 1.13 mg/dL (ref 0.62–1.27)
ESTIMATED GFR: >60 mL/min/{1.73_m2} (ref >60)
GLUCOSE: 150 mg/dL — ABNORMAL HIGH (ref 65–139)
POTASSIUM: 4.2 mmol/L (ref 3.5–5.1)
SODIUM: 137 mmol/L (ref 136–145)

## 2019-08-17 LAB — C-REACTIVE PROTEIN(CRP),INFLAMMATION: CRP INFLAMMATION: 14 mg/L — ABNORMAL HIGH (ref <=8.0)

## 2019-08-17 LAB — POC BLOOD GLUCOSE (RESULTS)
GLUCOSE, POC: 153 mg/dL — ABNORMAL HIGH (ref 70–105)
GLUCOSE, POC: 180 mg/dL — ABNORMAL HIGH (ref 70–105)
GLUCOSE, POC: 236 mg/dL — ABNORMAL HIGH (ref 70–105)

## 2019-08-17 LAB — PHOSPHORUS: PHOSPHORUS: 2.9 mg/dL (ref 2.4–4.7)

## 2019-08-17 MED ORDER — AMOXICILLIN 875 MG-POTASSIUM CLAVULANATE 125 MG TABLET
1.00 | ORAL_TABLET | Freq: Two times a day (BID) | ORAL | 0 refills | Status: AC
Start: 2019-08-17 — End: 2019-08-24

## 2019-08-17 MED ORDER — GABAPENTIN 400 MG CAPSULE
800.00 mg | ORAL_CAPSULE | Freq: Three times a day (TID) | ORAL | Status: DC
Start: 2019-08-17 — End: 2019-08-17
  Administered 2019-08-17: 800 mg via ORAL
  Filled 2019-08-17: qty 2

## 2019-08-17 MED ORDER — ACETAMINOPHEN 325 MG TABLET
650.00 mg | ORAL_TABLET | ORAL | 0 refills | Status: AC | PRN
Start: 2019-08-17 — End: ?

## 2019-08-17 MED ORDER — NYSTATIN 100,000 UNIT/GRAM TOPICAL CREAM
TOPICAL_CREAM | Freq: Two times a day (BID) | CUTANEOUS | 0 refills | Status: AC
Start: 2019-08-17 — End: 2019-08-24

## 2019-08-17 MED ORDER — LINEZOLID 600 MG TABLET
600.00 mg | ORAL_TABLET | Freq: Two times a day (BID) | ORAL | 0 refills | Status: AC
Start: 2019-08-17 — End: 2019-08-24

## 2019-08-17 MED ORDER — GABAPENTIN 400 MG CAPSULE
1200.00 mg | ORAL_CAPSULE | Freq: Three times a day (TID) | ORAL | 0 refills | Status: DC
Start: 2019-08-17 — End: 2019-08-17

## 2019-08-17 MED ORDER — INSULIN GLARGINE (U-100) 100 UNIT/ML SUBCUTANEOUS SOLUTION
20.00 [IU] | Freq: Every evening | SUBCUTANEOUS | Status: DC
Start: 2019-08-17 — End: 2019-08-17
  Filled 2019-08-17: qty 20

## 2019-08-17 MED ORDER — INSULIN LISPRO (U-100) 100 UNIT/ML SUBCUTANEOUS SOLUTION
6.00 [IU] | Freq: Three times a day (TID) | SUBCUTANEOUS | Status: DC
Start: 2019-08-17 — End: 2019-08-17
  Administered 2019-08-17: 16:00:00 6 [IU] via SUBCUTANEOUS
  Filled 2019-08-17: qty 3

## 2019-08-17 MED ORDER — INSULIN LISPRO (U-100) 100 UNIT/ML SUBCUTANEOUS SOLUTION
6.00 [IU] | Freq: Two times a day (BID) | SUBCUTANEOUS | 0 refills | Status: AC
Start: 2019-08-17 — End: ?

## 2019-08-17 MED ORDER — GABAPENTIN 400 MG CAPSULE
1200.00 mg | ORAL_CAPSULE | Freq: Three times a day (TID) | ORAL | 0 refills | Status: AC
Start: 2019-08-17 — End: ?

## 2019-08-17 MED ORDER — OXYCODONE 5 MG TABLET
5.00 mg | ORAL_TABLET | Freq: Three times a day (TID) | ORAL | 0 refills | Status: DC | PRN
Start: 2019-08-17 — End: 2020-04-05

## 2019-08-17 MED ORDER — LINEZOLID 600 MG TABLET
600.0000 mg | ORAL_TABLET | Freq: Two times a day (BID) | ORAL | Status: DC
Start: 2019-08-17 — End: 2019-08-17
  Administered 2019-08-17: 600 mg via ORAL
  Filled 2019-08-17: qty 1

## 2019-08-17 MED ORDER — AMOXICILLIN 875 MG-POTASSIUM CLAVULANATE 125 MG TABLET
1.0000 | ORAL_TABLET | Freq: Two times a day (BID) | ORAL | Status: DC
Start: 2019-08-17 — End: 2019-08-17
  Administered 2019-08-17: 1 via ORAL
  Filled 2019-08-17: qty 1

## 2019-08-17 MED ORDER — INSULIN GLARGINE (U-100) 100 UNIT/ML SUBCUTANEOUS SOLUTION
20.00 [IU] | Freq: Every evening | SUBCUTANEOUS | 0 refills | Status: AC
Start: 2019-08-17 — End: ?

## 2019-08-17 MED ORDER — NALOXONE 4 MG/ACTUATION NASAL SPRAY
1.00 | NASAL | 0 refills | Status: DC | PRN
Start: 2019-08-17 — End: 2020-04-01

## 2019-08-17 NOTE — Consults (Signed)
[place]  Date of Service: 08/17/2019  Encounter Start Date:  08/11/2019  Inpatient Admission Date:  08/11/2019    Service: Medicine 5  Requesting MD: Leanor Kail, MD  Chief Complaint:  Ulceration bilateral heels with questionable OM on x-ray of the left    Assessment/Recommendations:   1. Ulceration left heel and preulcerative lesion right heel  2. OM per plain films after I review the bone scan I feel it is highly unlikely this is acute OM and it probably residual  3. Continue collagen dressings per wound care nurse to left heel  4. No surgical intervention needed at this time  5. I think patient could be discharged on two week course of abx. And will follow with local wound care in [place], [place]  6. Insulin-dependent diabetes with diabetic neuropathy.    HPI/Discussion:  This 58 y.o. male who presents with bilateral heel wounds with the right being resolved but he still has a heel wound on the left.  I did discuss findings on bone scan with the patient and at this time he agreed to additional 2 week course of antibiotic therapy.  This could be either IV or oral antibiotic.  He will follow with the [place] back in approach and continue with home how.  All questions were answered entirety.         Patient Active Problem List    Diagnosis Date Noted   . Preseptal cellulitis of left eye 08/12/2019   . Sepsis due to methicillin resistant Staphylococcus aureus (MRSA) without acute organ dysfunction (CMS HCC) 08/12/2019   . Abscess of left periorbital region 08/11/2019     Past Medical History:   Diagnosis Date   . Diabetes mellitus, type 2 (CMS HCC)    . MRSA (methicillin resistant staph aureus) culture positive 08/06/2019    L eye cellulitis         Past Surgical History:   Procedure Laterality Date   . MANDIBLE SURGERY             Inpatient Medications:    .  acetaminophen (TYLENOL) tablet, 650 mg, Oral, Q4H PRN    .  albuterol (PROVENTIL) 2.5 mg / 3 mL  (0.083%) neb solution, 2.5 mg, Nebulization, Q4H PRN    .  cefepime (MAXIPIME) 2 g in NS 100 mL IVPB, 2 g, Intravenous, Q12H    .  DAPTOmycin (CUBICIN) 500 mg in NS 50 mL IVPB, 6 mg/kg, Intravenous, Q24H    .  gabapentin (NEURONTIN) capsule, 400 mg, Oral, 3x/day    .  heparin 5,000 unit/mL injection, 5,000 Units, Subcutaneous, Q8HRS    .  insulin glargine (LANTUS) 100 units/mL injection, 14 Units, Subcutaneous, NIGHTLY    .  insulin lispro (HUMALOG) 100 units/mL injection, 4 Units, Subcutaneous, 3x/day AC    .  melatonin tablet, 3 mg, Oral, NIGHTLY    .  metroNIDAZOLE (FLAGYL) tablet, 500 mg, Oral, 3x/day    .  NS flush syringe, 2-6 mL, Intracatheter, Q8HRS    And    .  NS flush syringe, 2-6 mL, Intracatheter, Q1 MIN PRN    .  nystatin (MYCOSTATIN) 100,000 units/g topical cream, , Apply Topically, 2x/day    .  oxyCODONE (ROXICODONE) immediate release tablet, 5 mg, Oral, Q8H PRN    .  oxyCODONE (ROXICODONE) immediate release tablet, 10 mg, Oral, Q8H PRN    .  polyethylene glycol (MIRALAX) oral packet, 17 g, Oral, Daily    .  sennosides-docusate sodium (SENOKOT-S) 8.6-50mg  per tablet, 1 Tab, Oral, 2x/day    .  SSIP insulin lispro (HUMALOG) 100 units/mL injection, 0-12 Units, Subcutaneous, 4x/day PRN      No Known Allergies  Family History  Reviewed in epic    Social History  Social History     Socioeconomic History   . Marital status: Widowed     Spouse name: Not on file   . Number of children: Not on file   . Years of education: Not on file   . Highest education level: Not on file   Occupational History   . Not on file   Social Needs   . Financial resource strain: Not on file   . Food insecurity     Worry: Not on file     Inability: Not on file   . Transportation needs     Medical: Not on file     Non-medical: Not on file   Tobacco Use   . Smoking status: Former Smoker     Types: Cigars   . Smokeless tobacco: Never Used   Substance and Sexual Activity   . Alcohol use: Yes     Comment: occasionally   . Drug use: Never    . Sexual activity: Not on file   Lifestyle   . Physical activity     Days per week: Not on file     Minutes per session: Not on file   . Stress: Not on file   Relationships   . Social Product manager on phone: Not on file     Gets together: Not on file     Attends religious service: Not on file     Active member of club or organization: Not on file     Attends meetings of clubs or organizations: Not on file     Relationship status: Not on file   . Intimate partner violence     Fear of current or ex partner: Not on file     Emotionally abused: Not on file     Physically abused: Not on file     Forced sexual activity: Not on file   Other Topics Concern   . Not on file   Social History Narrative   . Not on file       ROS: Other than ROS in the HPI, all other systems were negative.    Examination:  Temperature: 36.7 C (98.1 F)  Heart Rate: 63  BP (Non-Invasive): 134/74  Respiratory Rate: 16  SpO2: 94 %  Pain Score (Numeric, Faces): 7  Height: 181.6 cm (5' 11.5")  Weight: 84.4 kg (186 lb 1.1 oz)    General:   appears chronically ill and morbidly obese  Vascular exam   Dorsalis pedal pulse B/L -+2/4, palpable  Posterior tibial pulse B/L -+2/4, palpable  Skin temperature  B/L -warm to cool  Capillary refill  B/L All digits less than or equal to 5 seconds  Hair growth  B/L felt absent  Edema:   present - non-pitting B/L lower extremities+1  Trophic changes: thin skin  Dermatologic exam  Wounds: Wound 1: left heel size 4.9 by 3.0 by 0.1 cm, base: Red and beefy, periphery: hyperkeratotic, odor: no odor, discharge: serosanginous, tracking  -, undermining +, signs of infection: no signs of infection, depth to subcutaneous tissue, probes to does not probe  Neurologic exam  Gross sensation absent: foot on the bilateral  Orthopedic exam  Patient does have  absent Achilles tendon function on this left foot.  There does appear to be a palpable gap in the Achilles tendon.  With appearance of the calcaneus he has lost the  majority of his fat pad on the left.  Right heel shows purple thin discolored epidermis however there is no opening at this time.        Labs:    Lab Results Today:    Results for orders placed or performed during the hospital encounter of 08/11/19 (from the past 24 hour(s))   POC BLOOD GLUCOSE (RESULTS)   Result Value Ref Range    GLUCOSE, POC 234 (H) 70 - 105 mg/dl   CREATINE KINASE (CK), TOTAL, SERUM - TODAY & WEEKLY   Result Value Ref Range    CREATINE KINASE 13 (L) 45 - 225 U/L   POC BLOOD GLUCOSE (RESULTS)   Result Value Ref Range    GLUCOSE, POC 228 (H) 70 - 105 mg/dl   MAGNESIUM   Result Value Ref Range    MAGNESIUM 1.9 1.6 - 2.6 mg/dL   BASIC METABOLIC PANEL   Result Value Ref Range    SODIUM 137 136 - 145 mmol/L    POTASSIUM 4.2 3.5 - 5.1 mmol/L    CHLORIDE 109 96 - 111 mmol/L    CO2 TOTAL 21 (L) 22 - 32 mmol/L    ANION GAP 7 4 - 13 mmol/L    CALCIUM 8.8 8.5 - 10.2 mg/dL    GLUCOSE 150 (H) 65 - 139 mg/dL    BUN 16 8 - 25 mg/dL    CREATININE 1.13 0.62 - 1.27 mg/dL    BUN/CREA RATIO 14 6 - 22    ESTIMATED GFR >60 >60 mL/min/1.16m^2   PHOSPHORUS   Result Value Ref Range    PHOSPHORUS 2.9 2.4 - 4.7 mg/dL   C-REACTIVE PROTEIN(CRP),INFLAMMATION   Result Value Ref Range    CRP INFLAMMATION 14.0 (H) <=8.0 mg/L   CBC WITH DIFF   Result Value Ref Range    WBC 7.3 3.7 - 11.0 x10^3/uL    RBC 3.46 (L) 4.50 - 6.10 x10^6/uL    HGB 10.4 (L) 13.4 - 17.5 g/dL    HCT 31.1 (L) 38.9 - 52.0 %    MCV 89.9 78.0 - 100.0 fL    MCH 30.1 26.0 - 32.0 pg    MCHC 33.4 31.0 - 35.5 g/dL    RDW-CV 12.9 11.5 - 15.5 %    PLATELETS 247 150 - 400 x10^3/uL    MPV 10.3 8.7 - 12.5 fL    NEUTROPHIL % 66 %    LYMPHOCYTE % 20 %    MONOCYTE % 7 %    EOSINOPHIL % 3 %    BASOPHIL % 1 %    NEUTROPHIL # 4.89 1.50 - 7.70 x10^3/uL    LYMPHOCYTE # 1.44 1.00 - 4.80 x10^3/uL    MONOCYTE # 0.53 0.20 - 1.10 x10^3/uL    EOSINOPHIL # 0.19 <=0.50 x10^3/uL    BASOPHIL # <0.10 <=0.20 x10^3/uL    IMMATURE GRANULOCYTE % 3 (H) 0 - 1 %    IMMATURE GRANULOCYTE # 0.18 (H)  <0.10 x10^3/uL   POC BLOOD GLUCOSE (RESULTS)   Result Value Ref Range    GLUCOSE, POC 153 (H) 70 - 105 mg/dl   POC BLOOD GLUCOSE (RESULTS)   Result Value Ref Range    GLUCOSE, POC 180 (H) 70 - 105 mg/dl       Imaging Studies:    Asim Castleman  Male, 58 years old.    XR FOOT LEFT performed on 08/12/2019 12:54 PM.    REASON FOR EXAM:  open wound, T2DM, rule out osteomyelitis    TECHNIQUE: 3 views/3 images submitted for interpretation.    COMPARISON:  None    IMPRESSION:  There is diffuse osteopenia limiting evaluation. There is a  deep soft tissue ulcer over the posterior calcaneus. There is erosive  change of the calcaneus concerning for osteomyelitis.      Karlene Lineman  Male, 58 years old.    NUC INFECTION IMAGING, 3 PHASE WITH SPECT/CT SINGLE performed on 08/13/2019  3:11 PM.    REASON FOR EXAM:  osteomyelitis left heel    COMPARISON: Plain film imaging of the left foot dated August 12, 2019    TECHNIQUE:  Radionuclide: 25.8 mCi of 82mTc Ceretec tagged WBCs given intravenously    Study includes blood flow and immediate static of the lower extremities  from the mid calf through the toes. Additionally whole body imaging in the  anterior posterior views was obtained. Static imaging of the lower  extremities bilaterally from the mid calf to the toes was performed in the  anterior and posterior views.    SPECT-CT fused imaging of the lower extremities below the knees was  obtained in the sagittal coronal and axial planes.    FINDINGS:    Blood flow and immediate static imaging of the lower extremities  demonstrates asymmetric increased radiotracer uptake in the left lower  extremity as compared to the right this finding is compatible with  hyperemia and can be seen with an inflammatory/infectious process such as  diffuse cellulitis.    Delayed whole-body imaging demonstrates focal radiotracer uptake involving  the plantar left foot at the level of the calcaneus. Additionally focal  radiotracer uptake was seen  at the base of the left first toe. In the  remainder of the delayed imaging there was appropriate radiotracer uptake  by the soft tissues and skeletal system.    SPECT-CT fused imaging demonstrates a soft tissue ulceration inferior to  the left calcaneus with radiotracer accumulation compatible with infection.  Radiotracer accumulation extends to the eroded surface of the inferior  calcaneus raising concern for osteomyelitis.    There is radiotracer accumulation involving the dorsal soft tissues of the  distal left foot projecting over the proximal phalange of the first toe  with radiotracer accumulation involving the adjacent proximal phalange,  findings raise concern for osteomyelitis at this location. The proximal  phalanx demonstrates bony changes consistent with prior fracture of  uncertain chronicity.    Elsewhere in the visualized lower extremities no abnormal radiotracer  accumulation is seen within soft tissues or bone to suggest cellulitis or  osteomyelitis.    CT imaging demonstrates soft tissue swelling throughout the left foot with  soft tissue ulceration at the plantar surface adjacent to the calcaneus.    IMPRESSION:   IMPRESSION:     1. Findings concerning for osteomyelitis of the inferior left calcaneus  given radiotracer uptake and erosive bony changes. Soft tissue ulceration  with radiotracer uptake at this location is compatible with cellulitis.  2. Focal soft tissue radiotracer uptake superior to the left proximal first  phalange with extension of radiotracer into adjacent bone, findings raise  concern for cellulitis and osteomyelitis. The proximal phalange of the left  first toe demonstrates bony changes consistent with prior fracture of  uncertain chronicity.  3. Generalized hyperemia of the visualized lower left extremity especially  involving  the foot, findings are compatible with cellulitis    Clarise Cruz, DPM  08/17/2019, 12:28

## 2019-08-17 NOTE — Consults (Addendum)
INFECTIOUS DISEASE FOLLOW UP CONSULTATION    Patient Name: [name] [name] Number: [id number]  Date of Service: 08/17/2019  Date of Birth: 1961/06/29    Hospital Day:  LOS: 59 days     Reason for Consultation:  MRSA left-sided preseptal cellulitis and polymicrobial diabetic left foot ulcer with underlying chronic osteomyelitis    Subjective: Mr. Mazariego reports further improvement in swelling and erythema around his left eye today.  He still states his left eye vision is a little bit blurry.  He continues to feel he has chronic diabetic left heel ulcer has overall been getting better.  He remains without fevers, chills, or sweats.  He is tolerating antimicrobial therapy without pruritus, rash, or diarrhea.      Objective:  Physical Exam:  Vitals: BP 134/74   Pulse 63   Temp 36.7 C (98.1 F)   Resp 16   Ht 1.816 m (5' 11.5")   Wt 84.4 kg (186 lb 1.1 oz)   SpO2 94%   BMI 25.59 kg/m   General:  Chronically ill-appearing male.  He does not look toxic.  Eyes: Pupils equal round and reactive bilaterally.  Left eye nearly able to be completely opened.  Mild left periorbital edema and erythema (improved).  No active drainage.  No right-sided involvement.  ENT: Mucous membranes pink and moist.  No oral candidiasis or mucosal ulcers.  Edentulous.  Neck: Supple and nontender.  Lungs: Clear to auscultation bilaterally without wheezes, rhonchi, or crackles.  Unlabored respirations.  Cardiovascular: Regular rate and rhythm.  No murmur.  Abdomen: Normoactive bowel sounds.  Nondistended, soft, and nontender to palpation.  Extremities: 1+ edema of bilateral lower extremities.  Left heel ulcer that appears clean.  No foul odor, erythema at boarders, or purulent drainage.  No ulcer or deformity of left first toe.  Neurologic: Alert and oriented X 3.  Moves all extremities.  Decreased sensation of bilateral lower extremities.    Inpatient Medications:    .  acetaminophen (TYLENOL) tablet, 650 mg, Oral, Q4H PRN    .  albuterol  (PROVENTIL) 2.5 mg / 3 mL (0.083%) neb solution, 2.5 mg, Nebulization, Q4H PRN    .  cefepime (MAXIPIME) 2 g in NS 100 mL IVPB, 2 g, Intravenous, Q12H    .  DAPTOmycin (CUBICIN) 500 mg in NS 50 mL IVPB, 6 mg/kg, Intravenous, Q24H    .  gabapentin (NEURONTIN) capsule, 400 mg, Oral, 3x/day    .  heparin 5,000 unit/mL injection, 5,000 Units, Subcutaneous, Q8HRS    .  insulin glargine (LANTUS) 100 units/mL injection, 14 Units, Subcutaneous, NIGHTLY    .  insulin lispro (HUMALOG) 100 units/mL injection, 4 Units, Subcutaneous, 3x/day AC    .  melatonin tablet, 3 mg, Oral, NIGHTLY    .  metroNIDAZOLE (FLAGYL) tablet, 500 mg, Oral, 3x/day    .  NS flush syringe, 2-6 mL, Intracatheter, Q8HRS    And    .  NS flush syringe, 2-6 mL, Intracatheter, Q1 MIN PRN    .  nystatin (MYCOSTATIN) 100,000 units/g topical cream, , Apply Topically, 2x/day    .  oxyCODONE (ROXICODONE) immediate release tablet, 5 mg, Oral, Q8H PRN    .  oxyCODONE (ROXICODONE) immediate release tablet, 10 mg, Oral, Q8H PRN    .  polyethylene glycol (MIRALAX) oral packet, 17 g, Oral, Daily    .  sennosides-docusate sodium (SENOKOT-S) 8.6-50mg  per tablet, 1 Tab, Oral, 2x/day    .  SSIP insulin lispro (HUMALOG) 100 units/mL  injection, 0-12 Units, Subcutaneous, 4x/day PRN    Current Antimicrobials:  1. Daptomycin 6 mg/kg IV Q24 hours  2. Cefepime 2 g IV Q12 hours  3. Metronidazole 500 mg PO Q8 hours    Lines:  Peripheral IV line    Laboratory Studies:  Results for orders placed or performed during the hospital encounter of 08/11/19 (from the past 24 hour(s))   POC BLOOD GLUCOSE (RESULTS)   Result Value Ref Range    GLUCOSE, POC 219 (H) 70 - 105 mg/dl   POC BLOOD GLUCOSE (RESULTS)   Result Value Ref Range    GLUCOSE, POC 234 (H) 70 - 105 mg/dl   CREATINE KINASE (CK), TOTAL, SERUM - TODAY & WEEKLY   Result Value Ref Range    CREATINE KINASE 13 (L) 45 - 225 U/L   POC BLOOD GLUCOSE (RESULTS)   Result Value Ref Range    GLUCOSE, POC 228 (H) 70 - 105 mg/dl   MAGNESIUM      Result Value Ref Range    MAGNESIUM 1.9 1.6 - 2.6 mg/dL   BASIC METABOLIC PANEL   Result Value Ref Range    SODIUM 137 136 - 145 mmol/L    POTASSIUM 4.2 3.5 - 5.1 mmol/L    CHLORIDE 109 96 - 111 mmol/L    CO2 TOTAL 21 (L) 22 - 32 mmol/L    ANION GAP 7 4 - 13 mmol/L    CALCIUM 8.8 8.5 - 10.2 mg/dL    GLUCOSE 150 (H) 65 - 139 mg/dL    BUN 16 8 - 25 mg/dL    CREATININE 1.13 0.62 - 1.27 mg/dL    BUN/CREA RATIO 14 6 - 22    ESTIMATED GFR >60 >60 mL/min/1.24m^2   PHOSPHORUS   Result Value Ref Range    PHOSPHORUS 2.9 2.4 - 4.7 mg/dL   C-REACTIVE PROTEIN(CRP),INFLAMMATION   Result Value Ref Range    CRP INFLAMMATION 14.0 (H) <=8.0 mg/L   CBC WITH DIFF   Result Value Ref Range    WBC 7.3 3.7 - 11.0 x10^3/uL    RBC 3.46 (L) 4.50 - 6.10 x10^6/uL    HGB 10.4 (L) 13.4 - 17.5 g/dL    HCT 31.1 (L) 38.9 - 52.0 %    MCV 89.9 78.0 - 100.0 fL    MCH 30.1 26.0 - 32.0 pg    MCHC 33.4 31.0 - 35.5 g/dL    RDW-CV 12.9 11.5 - 15.5 %    PLATELETS 247 150 - 400 x10^3/uL    MPV 10.3 8.7 - 12.5 fL    NEUTROPHIL % 66 %    LYMPHOCYTE % 20 %    MONOCYTE % 7 %    EOSINOPHIL % 3 %    BASOPHIL % 1 %    NEUTROPHIL # 4.89 1.50 - 7.70 x10^3/uL    LYMPHOCYTE # 1.44 1.00 - 4.80 x10^3/uL    MONOCYTE # 0.53 0.20 - 1.10 x10^3/uL    EOSINOPHIL # 0.19 <=0.50 x10^3/uL    BASOPHIL # <0.10 <=0.20 x10^3/uL    IMMATURE GRANULOCYTE % 3 (H) 0 - 1 %    IMMATURE GRANULOCYTE # 0.18 (H) <0.10 x10^3/uL   POC BLOOD GLUCOSE (RESULTS)   Result Value Ref Range    GLUCOSE, POC 153 (H) 70 - 105 mg/dl     Microbiology:  08/13/2019 Respiratory virus panel by [employer]: Negative  08/13/2019 SARS-CoV-2 PCR: Negative  08/11/2019 Streptococcus pneumoniae urine antigen: Negative  08/11/2019 Legionella urine antigen: Negative  08/11/2019 Blood cultures X  2 sets: No growth X 4 days  08/11/2019 Purulent discharge from abscess: Gram stain showed several gram positive cocci.  Culture grew MRSA.    08/10/2019 Blood cultures X 2 sets: No growth on final cultures  08/06/2019 Forehead abscess  culture: MRSA (clindamycin resistant, rifampin sensitive, tetracycline sensitive, trimeth/sulfa sensitive, vancomycin MIC 2)    Imaging Studies: No new imaging studies have been performed.    Assessment: Mr. 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  Ophthalmology evaluated the patient and has not found evidence of orbital cellulitis.  Cultures taken from bedside incision and drainage procedures of left-sided preseptal cellulitis with abscess on 08/11/2019 have grown MRSA.  The patient's preseptal cellulitis appears much improved.  The patient's left heel ulcer appears clean and has reportedly gradually been getting better.  No ulcer or deformity of the left first toe is appreciated clinically.  Radiographic and subsequent nuclear medicine infection imaging did return concerning for osteomyelitis of the left heel and first proximal phalanx.  Podiatry reassess the patient and recent imaging today and recommended a short duration of polymicrobial coverage starting diabetic left heel ulcer rather than another prolonged course of intravenous antimicrobial therapy at this time.     Recommendations:    MRSA left-sided preseptal cellulitis and polymicrobial diabetic left heel ulcer with probable chronic osteomyelitis  1. Podiatry recommended a short duration of polymicrobial coverage targeting diabetic left heel ulcer.  Another prolonged course of intravenous antimicrobial therapy without surgical debridement unlikely to be beneficial, particularly as the patient  reports his left heel ulcer has been improving.  2. Upon discharge,  please start the following antimicrobials targeting both MRSA left-sided preseptal cellulitis and polymicrobial diabetic left heel ulcer:  - Linezolid 600 mg PO Q12 hours  - Amoxicillin-clavulanate 875-125 mg PO Q12 hours  3. Upon discharge, please discontinue daptomycin, cefepime, and metronidazole.  4. Plan for total 14 day course of antimicrobial therapy dated from bedside incision and drainage performed on 08/11/2019.  Today is day 6 of 14.    We will sign off at this time as the patient is being discharged today.  Please call or page the Infectious Diseases Service with any questions regarding this patient.    Evangeline Dakin, M.D., 08/17/2019  Infectious Diseases Fellow  [place] [place] Department of Medicine      08/17/2019  I saw and examined the patient.  I reviewed the fellow's note.  I agree with the findings and plan of care as documented in the fellow's note.  Any exceptions/additions are edited/noted.    Bettye Boeck, MD, PhD

## 2019-08-17 NOTE — Care Plan (Signed)
Problem: Adult Inpatient Plan of Care  Goal: Plan of Care Review  Outcome: Ongoing (see interventions/notes)  Goal: Patient-Specific Goal (Individualized)  Outcome: Ongoing (see interventions/notes)  Goal: Absence of Hospital-Acquired Illness or Injury  Outcome: Ongoing (see interventions/notes)  Goal: Optimal Comfort and Wellbeing  Outcome: Ongoing (see interventions/notes)  Goal: Rounds/Family Conference  Outcome: Ongoing (see interventions/notes)     Problem: Depression  Goal: Improved Mood  Outcome: Ongoing (see interventions/notes)     Problem: Fall Injury Risk  Goal: Absence of Fall and Fall-Related Injury  Outcome: Ongoing (see interventions/notes)     Problem: Wound  Goal: Optimal Wound Healing  Outcome: Ongoing (see interventions/notes)     Problem: Infection  Goal: Infection Symptom Resolution  Outcome: Ongoing (see interventions/notes)     Problem: Mobility Impairment  Goal: Optimal Mobility  Outcome: Ongoing (see interventions/notes)     Problem: Skin Injury Risk Increased  Goal: Skin Health and Integrity  Outcome: Ongoing (see interventions/notes)

## 2019-08-17 NOTE — Transitional Care (Signed)
[facility] Medicine   Transition of Care Coordination          Name: [name] [name]  Date of Birth: June 20, 1961 male  LOS: 6  Date/Time of Admission: 08/11/2019  5:44 PM   Service: MEDICINE 5    Scheduled appointment 08/25/19@3 :63 with  [facility] office. Notified the patient via bedside phone of appointment. Dr. Sherral Hammers from service notified of appointment via secure chat. Added to AVS.      [name]  08/17/2019, 14:31

## 2019-08-17 NOTE — Nurses Notes (Signed)
Cathy from [facility] called back and got report on patient.

## 2019-08-17 NOTE — Care Management Notes (Signed)
[facility] Management Note    Patient Name: [name] [name]  Date of Birth: 1961/01/08  Sex: male  Date/Time of Admission: 08/11/2019  5:44 PM  Room/Bed: 12/A  Payor: [place] MEDICARE / Plan: [place] MEDICARE ADVANTAGE PPO / Product Type: PPO /    LOS: 6 days   Primary Care Providers:  [facility], [facility] ([facility])    Admitting Diagnosis:  Abscess of left periorbital region [H05.012]    Assessment:      08/17/19 1352   Assessment Details   Assessment Type Continued Assessment   Date of Care Management Update 08/17/19   Date of Next DCP Update 08/20/19   Care Management Plan   Discharge Planning Status plan in progress   Projected Discharge Date 08/17/19   Discharge plan discussed with: Patient   Discharge Needs Assessment   Discharge Facility/Level of Care Needs Home with Home Health (code 6)       Discharge Plan:  Home with Home Health (code 6)  Per Service patient is discharge ready. Called and spoke to [place] at [facility] [phone]. HH order completed, Attending notified to sign. Fax patient data through [employer] to [phone]. Report number placed in AVS. BSN, patient and Service updated. Choice form completed, signed and sent to cm department for scanning.     The patient will continue to be evaluated for developing discharge needs.     Case Manager: Breck Coons  Phone: [phone]

## 2019-08-17 NOTE — Progress Notes (Signed)
[facility]  Medicine Progress Note  Full Code    [name] [name]  Date of service: 08/17/2019    Subjective:   No acute events overnight. Patient states his eye is feeling better and his vision is improving as well as is the ability to open the eye. He states that the nystatin cream has helped his itching and rash. He denies fevers, chills, chest pain, and shortness of breath. He is agreeable to discharge with oral antibiotics for his preseptal cellulitis and his osteomyelitis that was deemed to not be acute osteomyelitis. Risk factors to watch out for for worsening infection were discussed with his son that would warrant return to medical care. Follow up appointments were scheduled and outlined in discharge paperwork and medications were sent to discharge pharmacy so patient would have them prior to leaving. Patient has [facility] and feels safe going home and has no concerns about his home environment.    Vital Signs:  Temp (24hrs) Max:36.9 C (44.3 F)      Systolic (15QMG), QQP:619 , Min:112 , JKD:326     Diastolic (71IWP), YKD:98, Min:70, Max:80    Temp  Avg: 36.7 C (98.1 F)  Min: 36.6 C (97.9 F)  Max: 36.9 C (98.4 F)  MAP (Non-Invasive)  Avg: 86.7 mmHG  Min: 84 mmHG  Max: 90 mmHG  Pulse  Avg: 64  Min: 61  Max: 69  Resp  Avg: 17.2  Min: 16  Max: 18  SpO2  Avg: 96.2 %  Min: 94 %  Max: 99 %  Pain Score (Numeric, Faces): 7  Fi02    I/O:  I/O last 24 hours:      Intake/Output Summary (Last 24 hours) at 08/17/2019 1244  Last data filed at 08/17/2019 0738  Gross per 24 hour   Intake 1762 ml   Output 1625 ml   Net 137 ml     I/O current shift:  12/08 0700 - 12/08 1859  In: 120 [P.O.:120]  Out: 250 [Urine:250]  Blood Sugars: Last Fingerstick:  No results found for: GLUCOSEPOC      .  acetaminophen (TYLENOL) tablet, 650 mg, Oral, Q4H PRN    .  albuterol (PROVENTIL) 2.5 mg / 3 mL (0.083%) neb solution, 2.5 mg, Nebulization, Q4H PRN    .  cefepime (MAXIPIME) 2 g in NS 100 mL IVPB, 2 g, Intravenous, Q12H    .   DAPTOmycin (CUBICIN) 500 mg in NS 50 mL IVPB, 6 mg/kg, Intravenous, Q24H    .  gabapentin (NEURONTIN) capsule, 800 mg, Oral, 3x/day    .  heparin 5,000 unit/mL injection, 5,000 Units, Subcutaneous, Q8HRS    .  insulin glargine (LANTUS) 100 units/mL injection, 20 Units, Subcutaneous, NIGHTLY    .  insulin lispro (HUMALOG) 100 units/mL injection, 6 Units, Subcutaneous, 3x/day AC    .  melatonin tablet, 3 mg, Oral, NIGHTLY    .  metroNIDAZOLE (FLAGYL) tablet, 500 mg, Oral, 3x/day    .  NS flush syringe, 2-6 mL, Intracatheter, Q8HRS    And    .  NS flush syringe, 2-6 mL, Intracatheter, Q1 MIN PRN    .  nystatin (MYCOSTATIN) 100,000 units/g topical cream, , Apply Topically, 2x/day    .  oxyCODONE (ROXICODONE) immediate release tablet, 5 mg, Oral, Q8H PRN    .  oxyCODONE (ROXICODONE) immediate release tablet, 10 mg, Oral, Q8H PRN    .  polyethylene glycol (MIRALAX) oral packet, 17 g, Oral, Daily    .  sennosides-docusate  sodium (SENOKOT-S) 8.6-50mg  per tablet, 1 Tab, Oral, 2x/day    .  SSIP insulin lispro (HUMALOG) 100 units/mL injection, 0-12 Units, Subcutaneous, 4x/day PRN        No Known Allergies    Physical Exam:   Constitutional: appears in no distress, resting in bed comfortably  Eyes: right eye pupil is equal round, redness and swelling around left eye greatly improved, dressing removed, patient can open left eye and the pupil is equal round  Head: Seborrhea noted on scalp, around ears, and in beard with mild improvement  ENT: Mouth mucous membranes moist.   Neck: trachea midline, no JVD  Respiratory: Clear to ascultation bilaterally  Cardiovascular: regular rate and rhythm, S1, S2 normal, no murmur, click, rub or gallop. No s3/s4  Gastrointestinal: Soft, non-tender, Bowel sounds normal  Genitourinary: Deferred  Musculoskeletal: Head atraumatic and normocephalic, no edema noted, wound coverage of left heel in place  Integumentary:  Skin warm and dry and skin lesion over left eye/face, [place] boots in place, trace  pedal edema and ankles  Neurologic: Alert and oriented x3, no tremor, strength 4/5 in bilateral ankles and hand grip that is stable from past examination  Psychiatric: Affect Normal  [place]: Eye opening: 4 spontaneous, Verbal resonse:  5 oriented, Best motor response:  6 obeys commands      Labs:  Lab Results Today:    Results for orders placed or performed during the hospital encounter of 08/11/19 (from the past 24 hour(s))   POC BLOOD GLUCOSE (RESULTS)   Result Value Ref Range    GLUCOSE, POC 234 (H) 70 - 105 mg/dl   CREATINE KINASE (CK), TOTAL, SERUM - TODAY & WEEKLY   Result Value Ref Range    CREATINE KINASE 13 (L) 45 - 225 U/L   POC BLOOD GLUCOSE (RESULTS)   Result Value Ref Range    GLUCOSE, POC 228 (H) 70 - 105 mg/dl   MAGNESIUM   Result Value Ref Range    MAGNESIUM 1.9 1.6 - 2.6 mg/dL   BASIC METABOLIC PANEL   Result Value Ref Range    SODIUM 137 136 - 145 mmol/L    POTASSIUM 4.2 3.5 - 5.1 mmol/L    CHLORIDE 109 96 - 111 mmol/L    CO2 TOTAL 21 (L) 22 - 32 mmol/L    ANION GAP 7 4 - 13 mmol/L    CALCIUM 8.8 8.5 - 10.2 mg/dL    GLUCOSE 150 (H) 65 - 139 mg/dL    BUN 16 8 - 25 mg/dL    CREATININE 1.13 0.62 - 1.27 mg/dL    BUN/CREA RATIO 14 6 - 22    ESTIMATED GFR >60 >60 mL/min/1.45m^2   PHOSPHORUS   Result Value Ref Range    PHOSPHORUS 2.9 2.4 - 4.7 mg/dL   C-REACTIVE PROTEIN(CRP),INFLAMMATION   Result Value Ref Range    CRP INFLAMMATION 14.0 (H) <=8.0 mg/L   CBC WITH DIFF   Result Value Ref Range    WBC 7.3 3.7 - 11.0 x10^3/uL    RBC 3.46 (L) 4.50 - 6.10 x10^6/uL    HGB 10.4 (L) 13.4 - 17.5 g/dL    HCT 31.1 (L) 38.9 - 52.0 %    MCV 89.9 78.0 - 100.0 fL    MCH 30.1 26.0 - 32.0 pg    MCHC 33.4 31.0 - 35.5 g/dL    RDW-CV 12.9 11.5 - 15.5 %    PLATELETS 247 150 - 400 x10^3/uL    MPV 10.3 8.7 - 12.5 fL  NEUTROPHIL % 66 %    LYMPHOCYTE % 20 %    MONOCYTE % 7 %    EOSINOPHIL % 3 %    BASOPHIL % 1 %    NEUTROPHIL # 4.89 1.50 - 7.70 x10^3/uL    LYMPHOCYTE # 1.44 1.00 - 4.80 x10^3/uL    MONOCYTE # 0.53 0.20 - 1.10  x10^3/uL    EOSINOPHIL # 0.19 <=0.50 x10^3/uL    BASOPHIL # <0.10 <=0.20 x10^3/uL    IMMATURE GRANULOCYTE % 3 (H) 0 - 1 %    IMMATURE GRANULOCYTE # 0.18 (H) <0.10 x10^3/uL   POC BLOOD GLUCOSE (RESULTS)   Result Value Ref Range    GLUCOSE, POC 153 (H) 70 - 105 mg/dl   POC BLOOD GLUCOSE (RESULTS)   Result Value Ref Range    GLUCOSE, POC 180 (H) 70 - 105 mg/dl       Radiology:    TTE: no concern for valvular disease or vegitation    Microbiology:  urine streptococcus and legionella antigen negative, respiratory biofire negative, blood cultures no growth, sterile site culture MRSA (sensitivities below)   Ciprofloxacin >=8 mcg/mL Resistant     Clindamycin >=4 mcg/mL Resistant     Daptomycin 0.5 mcg/mL Sensitive     Doxycycline <=0.5 mcg/mL Sensitive     Erythromycin >=8 mcg/mL Resistant     Gentamicin <=0.5 mcg/mL Sensitive1     Levofloxacin 4 mcg/mL Resistant     Linezolid 2 mcg/mL Sensitive     Oxacillin >=4 mcg/mL Resistant     Rifampin <=0.5 mcg/mL Sensitive2     Tetracycline <=1 mcg/mL Sensitive     Tigecycline <=0.12 mcg/mL Sensitive     Trimethoprim/Sulfamethoxazole <=10 mcg/mL Sensitive     Vancomycin 1 mcg/mL Sensitive          PT/OT: Yes    Consults: Ophthalmology, Wound Care, Podiatry, MNT, DM education, ID, Psychiatry    Hardware (lines, foley's, tubes): PIV    Assessment/ Plan:   Clarnce Homan is a 58 year old male with a PMH of insulin dependent type 2 DM who presents as a transfer from and outside facility for periorbital abscess requiring drainage and IV antibiotics. No longer febrile or tachycardic on current antibiotics.  He participates well with the treatment plan at this point and appears to have decision-making capacity. Ophthalmology states there is no orbit involvement and podiatry thinks this is not acute osteomyelitis so can be treated with 2 weeks oral antibiotics. Infectious Disease assisted in antibiotic selection and he was transitioned to oral antibiotics (2 week course) and was appropriate  for discharge with close follow up and education regarding symptoms to watch for for worsening infection.  Active Hospital Problems    Diagnosis   . Preseptal cellulitis of left eye   . Sepsis due to methicillin resistant Staphylococcus aureus (MRSA) without acute organ dysfunction (CMS HCC)   . Abscess of left periorbital region     Preseptal Cellulitis with Abscess growing MRSA at outside facility-Improving  -Normothermic, not tachycardic  -Pain control with tylenol, oxy 5 and oxy 10 Q8hr PRN; will be discharged with tylenol and 20 tablets of oxycodone 5mg  (take 1-2 tablets every 8 hours as needed for pain) and a narcan prescription  -CRP 210 inially and downtrending  -Switched to oral Linezolid and Augmentin today for treatment of cellulitis and osteomyelitis for total of 2 week course since initial I&D  -Blood cultures no growth here  -Cultures from outside facility was clindamycin resistant and vancomycin sensitive (see media  for report and see above for our sensitivities)  -ID consult for antibiotic assistance given multiple infections and appreciate assistance-agreed to 2 weeks oral antibiotics with above regimen  -Ophthalmology performed I&D at bedside on day of admission and are following (appreciate assistance)              -Further evaluation for glaucoma, right eye nevus, and NPDR as outpatient-ophthamology appointment scheduled near his home   -ophthalmology okay with 2 week course of antibiotics and will put in their own follow up for occuloplastics if indicated    DM Foot Wound with Concern for Osteomyelitis-Podiatry Thinks Unlikely Acute Osteomyelitis  -Xray and nuclear medicine imaging concerning for osteomyelitis  -Consulting podiatry   -No surgical intervention; do not think this is acute osteomyelitis and stated that 2 week course of IV or PO antibiotics is appropriate. Follow with local wound care  -Switched to Linezolid and Augmentin for 2 week total course  -Wound care consulted and appreciate  recommendations   -Podiatry follow up as outpatient for DM shoe fitting (appointment scheduled)   -MNT consult  -ID consulted and appreciate assistance (see above)  -Will discharge home on oral antibiotics with podiatry, ophthalmology, PCP, TCC follow up  -Has home health that performs wound care for him, new order signed    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   -Hemoglobin A1c 8.8  -Holding home regimen (Lantus 20 units nightly and unclear short acting insulin regimen)  -On SSI with QID POC checks  -Glargine 20 units and lispro 6 unit TID with meals-Will discharge on glargine 20 units daily and 6 units BID with meals as he only eats two meals a day  -Increased gabapentin to 800mg  TID and will go home on his original dose  -DM education consulted    Seborrheic Dermatitis  -No antifungal shampoo so started on nystatin cream BID and will continue for 7 days upon discharge  -HIV negative    Normocytic Anemia-Improved  -Anemia of chronic disease on iron panel    Incidental Lung Nodule Found on CT  -Recommend repeat imaging in 6-12 months to follow this  -Will put this in D/C summary for PCP to follow up on    Question of [facility]  -We currently believe he has [facility]  -Past paperwork suggesting he was deemed to not have capacitance   -Consulted psychiatry for assistance with appropriate course of action given paperwork and our clinical assessment-they agree he has [facility]  -Currently complying with all medical care  -Feels safe going home and has no concerns about his home environment    Resolved Hospital Problems:  AKI-Improved after stopping vancomycin and IVF hydration on 12/6  Concern for Pneumonia  Constipation    DVT/PE Prophylaxis: Heparin    Disposition Planning: Home discharge  and Home Health; PT recommended SNF but patient declines going to SNF    Leanor Kail, MD        I saw and examined the patient.  I reviewed the resident's note.  I agree with the findings and plan of care as documented in the  resident's note.  Any exceptions/additions are edited/noted.    Shane Crutch., MD

## 2019-08-17 NOTE — Nurses Notes (Signed)
Patient and his son verbalized understanding of discharge instructions, follow-up appointments, and home medications. Patient denies any questions or concerns. [facility] was called to give report; on-call nurse is to call me back to get report. Son is picking up patient's prescriptions from discharge pharmacy and providing transportation home.

## 2019-08-17 NOTE — Discharge Summary (Signed)
[facility]  DISCHARGE SUMMARY    PATIENT NAME:  [name] [name], [name] [name]  MRN:  [id number]  DOB:  1961/07/06    ENCOUNTER DATE:  08/11/2019  INPATIENT ADMISSION DATE: 08/11/2019  DISCHARGE DATE:  08/17/2019    ATTENDING PHYSICIAN: [name] [name].*  SERVICE: MEDICINE 5  PRIMARY CARE PHYSICIAN: [facility]     Has PCP been verified with patient and updated? Yes    LAY CAREGIVER:  ,  ,        PRIMARY DISCHARGE DIAGNOSIS:    Active Hospital Problems    Diagnosis Date Noted   . Preseptal cellulitis of left eye [L03.213] 08/12/2019   . Sepsis due to methicillin resistant Staphylococcus aureus (MRSA) without acute organ dysfunction (CMS HCC) [A41.02] 08/12/2019   . Abscess of left periorbital region [H05.012] 08/11/2019      Resolved Hospital Problems   No resolved problems to display.     There are no active non-hospital problems to display for this patient.       DISCHARGE MEDICATIONS:     Current Discharge Medication List      START taking these medications.      Details   acetaminophen 325 mg Tablet  Commonly known as: TYLENOL   650 mg, Oral, EVERY 4 HOURS PRN  Qty: 30 Tab  Refills: 0     amoxicillin-pot clavulanate 875-125 mg Tablet  Commonly known as: AUGMENTIN   1 Tab, Oral, 2 TIMES DAILY  Qty: 14 Tab  Refills: 0     insulin lispro 100 unit/mL Solution  Commonly known as: HUMALOG   6 Units, Subcutaneous, 2 TIMES DAILY BEFORE MEALS  Qty: 10 mL  Refills: 0     linezolid 600 mg Tablet  Commonly known as: ZYVOX   600 mg, Oral, 2 TIMES DAILY  Qty: 14 Tab  Refills: 0     naloxone 4 mg/actuation Spray, Non-Aerosol  Commonly known as: NARCAN   1 Spray, INTRANASAL, EVERY 2 MIN PRN  Qty: 1 Bottle  Refills: 0     nystatin 100,000 unit/gram Cream  Commonly known as: MYCOSTATIN   Apply Topically, 2 TIMES DAILY  Qty: 15 g  Refills: 0     oxyCODONE 5 mg Tablet  Commonly known as: ROXICODONE   5 mg, Oral, EVERY 8 HOURS PRN, Please take 1-2 tablets every 8 hours for pain as needed.  Qty: 20 Tab  Refills: 0         CONTINUE these medications - NO CHANGES were made during your visit.      Details   gabapentin 400 mg Capsule  Commonly known as: NEURONTIN   1,200 mg, Oral, 3 TIMES DAILY  Qty: 270 Cap  Refills: 0     insulin glargine 100 unit/mL injection (vial)   20 Units, Subcutaneous, NIGHTLY  Qty: 10 mL  Refills: 0          Discharge med list refreshed?  YES     During this hospitalization did the patient have an AMI, PCI/PCTA, STENT or Isolated CABG?  No                ALLERGIES:  No Known Allergies      HOSPITAL PROCEDURE(S):   Bedside Procedures:  Orders Placed This Encounter   Procedures   . BEDSIDE  I&D   . BEDSIDE  I&D     Surgical     REASON FOR HOSPITALIZATION AND HOSPITAL COURSE     BRIEF HPI:  [name] [name] is  a 58 y.o., White male with a PMH of insulin dependent type 2 DM who presents form outside facility for concerns of preseptal cellulitis and abscess of left eye. Patient states he scratched himself in the area about 7 days prior awhile itching himself leaving a cut. This became swollen, red, and painful causing him to present for medical care 4-5 days prior at an outside facility. He was given clindamycin PO for this and sent home. His eye/face continued to worsen so he presented back to the outside facility and was admitted for IV antibiotics given severity and lack of improvement. CT was performed at outside facility concerning for edema/cellulitis of the area and he was sent to [facility] for ophthalmology consultation. He states the pain is dull and throbbing and at times is an 8/10 in intensity. He has had associated chills but denies fevers. He states he can open his eye slightly but has difficulty seeing out of it related to the swelling but denies other vision changes. He denies past events similar but does have DM foot wounds that he follows with wound care for as an outpatient.    BRIEF HOSPITAL NARRATIVE:   On admission, patient was tachycardic, febrile and was found to have the preseptal cellulitis and  abscess as well as a diabetic foot ulcer. Blood cultures were obtained and IV antibiotics were started with vancomycin, metronidazole, and flagyl given MRSA preseptal cellulitis and DM foot ulcer that was likely polymicrobial. He was given IVF for concern of possible sepsis. Ophthalmology performed I&D bedside in the ED and followed along throughout his hospitalization. Wound care was consulted for the diabetic foot wound and XRays were obtained that were concerning for osteomyelitis. Podiatry was consulted at that time and ordered a nuclear scan. They thought surgery was not indicated. The patient had recently (June) undergone 6 weeks of IV antibiotics for osteomyelitis of this foot. Podiatry determined they do not think this was acute osteomyelitis and was appropriate for 2 weeks of antibiotics (IV or PO). ID was consulted for assistance and thought patient could be switched to oral Linezolid and Augmentin for 2 weeks to cover both concerns. He had an AKI on presentation that was pre-renal that improved with IVF but then worsened likely related to vancomycin. The vancomycin was discontinued for a day of daptomycin that was then transitioned to PO Linezolid. AKI improved on day of discharge. CXR originally concerning for possible consolidation so doxycycline was added but CT chest was not concerning for septic emboli or pneumonia so this was discontinued. TTE (past history of IE without IVDU history) without concerns for vegetation and blood cultures negative to date. Pain was controlled with gabapentin (lower than home dose), tylenol, and oxycodone PRN. He will be discharged on PO Linezolid and Augmentin with start date being date of I&D in ED. He will be discharged on glargine 20 units daily and lispro 6 units BID (only eats 2 meals at home) and DM education met with patient multiple times to talk about his DM and how to take his insulin. He will resume his home gabapentin dose (1200 TID) and be sent home with  tylenol and 20 tablets of oxycodone 66m (take 1-2 tablets every 8 hours as needed for pain) and a dose of narcan. PT recommended SNF but patient did not wish to go to a SNF so he was discharged home with home health and close follow up with PCP, TCC, ophthalmology, and podiatry. The ophthalmology team here was going to see if he  needed to be seen back by the occuloplastics team and would put in the order if that was indicated. Medications were filled at discharge pharmacy and patient's son was educated (and would educate the patient) on warning signs to look out for that would be suspicious for worsening infection and warrant seeking medical care.    Question was brought up by son about home living situation and patient not having [place] based on a past psychiatric evaluation. He was deemed to have clinical status on our evaluation but psychiatry was consulted given this past evaluation and they agreed. Son was concerned for cleanliness and care at patient's home as he lives with his daughter. Patient states he feels safe at home and has no concerns about his home situation.     TRANSITION/POST DISCHARGE CARE/PENDING TESTS/REFERRALS:  Please follow up with PCP for:  -Care of diabetes  -Needs repeat CT Chest in 6-12 months for incidental lung nodule finding on CT here  -AKI has resolved but please ensure creatinine continues to be stable  [facility] follow up of preseptal cellulitis/abscess and osteomyelitis  -Has home health that was doing wound care and will continue to have home health but please refer to official wound care if home health is not enough for diabetic foot ulcer    Please follow up with TCC phone call:  -Ensure patient complying with antibiotics and is attending  -Ambulation ability, home safety as he was recommended to go to SNF and declined this  -Needs repeat CT Chest in 6-12 months for incidental lung nodule finding on CT here-please ensure PCP orders this if possible  -Has home health that was  doing wound care and will continue to have home health but please refer to official wound care if home health is not enough for diabetic foot ulcer  -If occuloplastics has indicated they do want follow up please ensure he knows about this and attends this appointment    Please follow up with general ophthalmology for:  -Preseptal cellulitis and abscess-will complete 2 weeks total after I&D of Linezolid (got Vancomycin inpatient)  -Right iris nevus  -Glaucoma and diabetic retinopathy     Please follow up with podiatry for:  -DM shoe fitting  -Osteomyelitis of left foot that podiatry here did not think was acute and was appropriate for 2 weeks of antibiotics (IV or PO) so is going home on Linezolid and augmentin    CONDITION ON DISCHARGE:  A. Ambulation: Has impaired strength, balance, mobility and endurance recommended going to SNF but patient did not want to go to SNF  B. Self-care Ability: Complete  C. Cognitive Status Alert and Oriented x 3  D. Code status at discharge:   Code Status Information     Code Status    Prior                 LINES/DRAINS/WOUNDS AT DISCHARGE:   Patient Lines/Drains/Airways Status    Active Line / Dialysis Catheter / Dialysis Graft / Drain / Airway / Wound     Name: Placement date: Placement time: Site: Days:    Wound (Non-Surgical) Left Heel  08/11/19   --   6    Wound (Non-Surgical) Right Heel  08/10/19   --   7    Wound (Non-Surgical) Left Eye  08/11/19   --   6                DISCHARGE DISPOSITION:  Home discharge and Home Health  DISCHARGE INSTRUCTIONS:  [facility] On 08/25/2019.    Specialty: EXTERNAL  Why: 3:30, Hospital Discharge Follow-Up  Contact information:  [facility]  [place] [address]  [phone]             Internal Medicine Clinic, POC .    Specialty: Internal Medicine  Contact information:  [address]  [place] [address]  [phone]  Additional  information:  [facility] are currently suspended due to COVID-19 restrictions at this [place] outpatient clinic. We apologize for any inconvenience this may cause. Please ask an attendant for assistance if needed.                  Refer to [place]    Do not exceed 4000 mg (or 4 grams) of tylenol in a day.    Please follow up with:    Your primary care provider on 08/25/2019 at 330PM    Transitions of Care Clinic via Phone Call on 08/24/2019 at 1225PM    Follow up Opthalmology: Dr. Gardiner Ramus, MD [address] [place], [place] [address] August 30, 2019 at 12:05pm-follow up preseptal cellulitis and abscess, right iris nevus, evaluate for glaucoma and NPDR    Follow up Podiatry: Dr. Eunice Blase Office with Dr. Leland Her [address] [place], [place] [address] August 24, 2019 at 11:00am-follow up to get fitted for diabetic shoes and to follow up osteomyelitis     [place]     Reason for visit: HOSPITAL DISCHARGE    Follow-up in: 1 WEEK    Follow-up reason: hospital discharge, corrdinate care and ensure he schedules follow ups                 Leanor Kail, MD    Copies sent to Care Team       Relationship Specialty Notifications [place], [place] PCP - General EXTERNAL  08/17/19     Verified by transitions team on 08/17/19 TS    Phone: [phone] Fax: [phone]         [place] [address]            Referring providers can utilize [email or web address] to access their referred [place] patient's information.

## 2019-08-17 NOTE — Transitional Care (Signed)
[facility] Medicine   Transitional Care Coordination   Initial Assessment       Name: [name] [name]   Date of Birth: 24-Jul-1961 58 y.o.  Date of service: 08/17/2019  Lay Caregiver:  ,  ,         1. Patient appointment preferences: None  2. Transportation to healthcare appointments: Family transports  3. PCP listed as [facility]. This is correct per patient. Obtained verbal consent from patient to contact PCP.   4. Patient MyChart status: Inactive  5. Preferred method of contact: [phone]  Confirmed patient contact information:   Home Phone [phone]   Work Phone [phone]   Mobile [phone]       Spoke with patient to discuss provider of primary care services.  Discussed role of Transitional Care Coordination Team and informed of contact within 2 business days of discharge to assess follow-up plan.    Discussed with patient that follow up appointment may be a telephone encounter or MyChart video visit instead of a clinical appointment due to current pandemic. Patient MyChart status is inactive. MyChart registration link sent: N/A- patient refused    Patient states they do not have access to a device with video capabilities.      [name]  08/17/2019, 14:14

## 2019-08-17 NOTE — Consults (Signed)
[facility]  OPHTHALMOLOGY Consult Follow Up    Arad, Burston, 58 y.o. male  Date of Birth:  28-Jun-1961  Date of Service:  08/17/2019    F/U evalutation of:  Preseptal cellulitis, left eye    Subjective: Reports that he is feeling much better today and denies any vision changes. States that he feels the drainage has improved.    Data:    Mood and Affect Normal:  Yes  Mental Status - Oriented to person, place, and time:  Yes    Visual Acuity:  sc  cc  ph  near    OD 20/50 +   20/30-      OS 20/50 +   20/30-         Intraocular Pressure: by [place]  OD:  21     OS:  23      Ocular Motility and Alignment:    normal OU    Visual fields by confrontation:    Normal OU     OD OS   Adnexa WNL Packing removed with scant purulent drainage over medial brow. Crusting present around wound edges   Cornea WNL WNL   Anterior Chamber WNL WNL   Iris/pupils   WNL WNL   APD No No   Lens 2+ NS 2+ NS     Pupil Dilation:  not done     Labs/Images:  Hb 10.4    A/P:   58 y.o. with exam and imaging consistent with preseptal cellulitis. 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 IOP acceptable, no RAPD, color plates near full.     Recommendations:  -Culture result obtained here 08/11/19 with clindamycin-resistant MRSA  -Broad spectrum antibiotics per primary team, appreciate ID recommendations  -Iris nevus on right eye without concern for malignancy, recommend outpatient followup  -Recommend keeping wound open while drainage continues, saline rinses of wound bed with perilesional massage/warm compresses to facilitate expression. Erythromycin ophthalmic ointment to wounds TID. No need for further dressing or taping.  -Recommend further evaluation of glaucoma and NPDR in an outpatient setting  -Will follow up in 1 week, please make appointment with oculoplastics for 1 week after discharge if discharged before then.    Laurey Morale, MD    I saw and examined the patient.  I reviewed the  resident's note.  I agree with the findings and plan of care as documented in the resident's note.  Any exceptions/additions are edited/noted.  Delia Chimes, MD 08/17/2019, 09:14

## 2019-08-17 NOTE — Care Management Notes (Signed)
Discharge Recommendations/ Plan:Discharge SV:XBLT with Home Health (code 6)      Resources: Nursing to call report to [place] @ [phone].

## 2019-08-17 NOTE — Transitional Care (Signed)
[facility] Medicine   Transition of Care Coordination   PCP Verification        Name: [name] [name]   Date of Birth: 1960/10/09 58 y.o.  Date of service: 08/17/2019  Lay Caregiver:  ,  ,           1.[place] office to verify establishment and determine barriers to follow-up. [facility] verified as patient's PCP.    2. Appointment availability within 7 days of discharge: Yes  3. Does patient have an upcoming appointment scheduled in the next 30 days?: No  4. PCP agreeable to follow warfarin/INR if applicable: Yes  5. PCP agreeable to follow home health if applicable: Yes  6. PCP office offers designated personnel for care coordination: No  7. PCP contact information correct: Yes      [name]  08/17/2019, 14:38

## 2019-08-17 NOTE — Transitional Care (Signed)
[facility]   Transitional Care Coordination      Patient is a potential discharge per Dr. Sherral Hammers with service. Patient is appropriate for telephonic visit per Dr. Sherral Hammers.    Called patient at bedside. Scheduled patient in TCC on 08/24/19 at 12:25 as a telephonic visit. Appointment added to AVS. Dr. Sherral Hammers with service notified of appointment via secure chat.    [name]  08/17/2019, 14:30

## 2019-08-17 NOTE — Consults (Signed)
Diabetes Education Consult    Consulted to provide general diabetes education and insulin instruction. Patient seen previously on 08/12/2019.    Met with [name] [name]. Explained purpose of visit. He stated "Oh I know all that. I have lots of papers about that, I don't want anymore."     Discussed importance of diabetes management for healing, reducing risk of infection, and overall health.     Reviewed insulin regimen. Discussed Lantus and Humalog, when to take each, and current doses. Advised him to take the Lantus dose as prescribed every day instead of varying it. Explained that he does not need to use sliding scale for Humalog at this time, just take pre-meal dose and eat a meal.    Patient has meter; previously reported he only tests once daily. Advised him to test more regularly. He stated "well I don't eat breakfast, that's why I wasn't testing in the morning." Shares that he likes to get up between 10-11:00. Encouraged him to eat meal #1 by noon, meal #2 at 4-5:00pm, and meal #3 at 8-9:00. He was not receptive. States he tends to eat 2 meals per day and just eats when he's hungry. Pointed out that he has been eating 3 meals most days, at regular times, while hospitalized. He states when he gets a tray here he isn't eating a large amount, for example he didn't care for the meatloaf last night. Discussed how it can be helpful to eat smaller amounts at regular intervals, while using meal time insulin as prescribed.    Reviewed hypoglycemia.    Current A1c:    Lab Results   Component Value Date    HA1C 8.8 (H) 08/11/2019       Discussed A1c meaning, result, and goal.    Reviewed disease progression, ADA BG targets and the importance of testing and avoidance of complications by managing blood sugars.     Patient shared that he follows with [facility] for diabetes. Denies issues with obtaining testing supplies or medications.     [name] [name] had questions about getting meds from d/c pharmacy, states that his  son is coming from 4 hours away and will have to pay for the meds.     Answered all questions. Verbalized understanding. Contact information provided.    Candy Sledge, RD, MPH  [facility]  Phone [phone] / [phone]

## 2019-08-23 NOTE — Care Management Notes (Signed)
Referral Information  ++++++ Placed Provider #1 ++++++  Case Manager: Joyce Facemire  Provider Type: Home Health  Address:  ,    Contact:    Fax:   Fax:

## 2019-08-24 ENCOUNTER — Telehealth (INDEPENDENT_AMBULATORY_CARE_PROVIDER_SITE_OTHER): Payer: Self-pay | Admitting: Pharmacist

## 2019-08-24 ENCOUNTER — Telehealth (INDEPENDENT_AMBULATORY_CARE_PROVIDER_SITE_OTHER): Payer: Self-pay | Admitting: Family

## 2019-08-24 NOTE — Progress Notes (Signed)
Transitions of Care Pharmacy Note     Attempted to call patient to complete medication review prior to telephone/MyChart visit with TCC provider today. Unable to reach patient. Only listed number in chart is for patient's son who answered and stated he was at work and not with patient. He provided phone number for patient's daughter who lives with patient Erasmo Downer, [phone]) and asked we try that number. Called provided number but no answer and unverified voicemail.      Riti Rollyson A. Tyianna Menefee, PharmD, [place], [place]  Clinical Pharmacy Specialist, [facility]  Transitions of Care: [phone]  Anticoagulation Monitoring: [phone]

## 2019-08-30 ENCOUNTER — Ambulatory Visit (INDEPENDENT_AMBULATORY_CARE_PROVIDER_SITE_OTHER): Payer: Self-pay | Admitting: Ophthalmology

## 2020-03-27 NOTE — Care Management Notes (Signed)
MARS INTAKE    PATIENT INFORMATION    PCP: [facility]  Effective Insurance:   Payor/Plan Subscr Relation Effective Group Num   1. [place] Self 05/11/19 [id number]                                   [address] [address]       Insurance Comments:    RESERVATION INFORMATION  Received call from:   Phone:   Referring Provider:  Dr. Jule Ser        Referring Provider Phone: [phone]   Transfer Source: [facility]     Transfer Emergent:  Urgent  Transfer Reason: Other, See Transfer Comments  Transfer Comments: Condition worsening despite 3 washouts  Admitted on: 03/08/2020   Pt Class and Level of Care: Inpatient Acute  Diagnosis: Shoulder infection      PMH:    Past Medical History:   Diagnosis Date   . Diabetes mellitus, type 2 (CMS HCC)    . MRSA (methicillin resistant staph aureus) culture positive 08/06/2019    L eye cellulitis     IVDU(denies currently use)  Dialysis: no  Cancer: no  Isolation: no      Patient story/clinical presentation:Patient presents with left heel osteo and draining shoulder wound.  Washed out 3 times since admission.  Wound cultures negative.  MRI shoulder shows large abscess that has been drained and multiple other abscesses.  Infection extends from pectoral muscle to spine.  Neuro intact.  Dr. Augustin Coupe recommended Medicine admission.  Dr. Blanch Media agreeable to [facility] admission.       Current vital signs:    HR:     80   BP:     122/72   Resp:     18   Temp:     97.8   Sats:     95   O2:     ra         Specialty Bed Screening:    HT:        WT: 78kg   Other Assist Requirements:            Per MD labs WNL unless noted below.    Labs:  WBC (3.6-11)    HGB (13.1-17.3)    Hct (39.8-50.2)    PLT (140-400)    Na (135-145)    K+ (3.5-5.1)    CL (96-111)    CO2 (23-35)    BUN (8-26)    Cr (0.62-1.27)    Glucose (60-105)    Ca (8.5-10.4)    Mag (1.6-2.5)    Phos (2.4-4.7)    BNP (<100)    D-Dimer (<233)    AST (8-41)    ALT (<55)    Alk Phos (<150)    Amylase  (25-125)    Lipase (10-80)    T. Bili (0.3-1.3)    PT (8.7-13.2)    PTT (25.1-36.5)    INR (0.8-1.2)    Trop-1 (<0.03)    CK    CPK    CK-MB    ABG ph    ABG PCO2    ABG PO2    ABG HCO3    Base def/exc    LP Glucose    LP Neutrophil    LP Protein    LP WBC    LP Other            Radiology (  please place images on image grid):  EKG:  IV Access:PICC  Medications, IVF, Drips: ceftriaxone, vancomycin    Admitting Pt Class and Level of Care: Inpatient, Semi-Private,   Accepted By: Nonie Hoyer, HOSPITALIST 59    *Send Text Page to accepting service MD. *      PATIENT UPDATE    Date/time of update:  7/20 0944  Update received from (name/title):  [facility] RN    Vital Signs -  T98.7  P102  R18  B/P124/93  Pertinent critical labs: none  Any change in oxygen requirements:  no 92% RA  Any change mental status (alert, lethargic, obtunded, ect.):  no    Does patient still require transfer?  If not, why? (left AMA, went to a different facility, etc.):  yes     PATIENT UPDATE    Date/time of update:  03-28-2020; 2046  Update received from (name/title):  Roselyn Reef, bedside nurse    Vital Signs -  T 97.7  P 85  R 16  B/P 130/69  Pertinent critical labs: none  Any change in oxygen requirements:  no; 95%  RA  Any change mental status (alert, lethargic, obtunded, ect.):  no    Does patient still require transfer?  If not, why? (left AMA, went to a different facility, etc.):  yes     PATIENT UPDATE    Date/time of update:  7/21 0921   Update received from (name/title): Lilia Pro RN     Vital Signs -  T98.3  P83  R20  B/P133/66  Pertinent critical labs: CBC pending  Any change in oxygen requirements:  no 93% RA  Any change mental status (alert, lethargic, obtunded, ect.):  no    Does patient still require transfer?  If not, why? (left AMA, went to a different facility, etc.):  yes       PATIENT UPDATE    Date/time of update:  7/21 2105  Update received from (name/title):  Jaunita     Vital Signs -  T99.6F  P 88 R18 B/P155/79  Pertinent critical  labs: no  Any change in oxygen requirements:  no 94 RA  Any change mental status (alert, lethargic, obtunded, ect.):  no    Does patient still require transfer?  If not, why? (left AMA, went to a different facility, etc.):  yes    PATIENT UPDATE    Date/time of update:  7/22'@931'   Update received from (name/title):  Crystal    Vital Signs -  T98.9  P79  R18  B/P154/83  Pertinent critical labs: Cr 1.5  Any change in oxygen requirements:  no  Any change mental status (alert, lethargic, obtunded, ect.):  no    Does patient still require transfer?  If not, why? (left AMA, went to a different facility, etc.):  yes      PATIENT UPDATE    Date/time of update: 07/22 2200  Update received from (name/title):  Sabrina, charge nurse  Vital Signs -  T98.8  P82  R18  B/P142/75  Pertinent critical labs: none  Any change in oxygen requirements:  no sats 97% on RA  Any change mental status (alert, lethargic, obtunded, ect.):  no    Does patient still require transfer?  If not, why? (left AMA, went to a different facility, etc.):  yes     PATIENT UPDATE    Date/time of update:  03/31/20 0943  Update received from (name/title):  Merry Proud, nurse    Vital Signs -  T98.8  P80  R18  B/P141/71  Pertinent critical labs: Cr 1.5  BS 130-270  Any change in oxygen requirements:  no RA  Any change mental status (alert, lethargic, obtunded, ect.):  no    Does patient still require transfer?  If not, why? (left AMA, went to a different facility, etc.):  yes, drainage now looking clear from shoulder     PATIENT UPDATE    Date/time of update:  03/31/2020   Update received from (name/title):  Dance movement psychotherapist Nurse    Vital Signs -  [phone]  P89  R20  B/P146/96  Pertinent critical labs: none  Any change in oxygen requirements:  no  Any change mental status (alert, lethargic, obtunded, ect.):  no    Does patient still require transfer?  If not, why? (left AMA, went to a different facility, etc.):  yes

## 2020-04-01 ENCOUNTER — Inpatient Hospital Stay (HOSPITAL_COMMUNITY): Payer: Medicare PPO | Admitting: Internal Medicine

## 2020-04-01 ENCOUNTER — Inpatient Hospital Stay (INDEPENDENT_AMBULATORY_CARE_PROVIDER_SITE_OTHER): Payer: Medicare PPO

## 2020-04-01 ENCOUNTER — Inpatient Hospital Stay (HOSPITAL_COMMUNITY): Payer: Medicare PPO

## 2020-04-01 ENCOUNTER — Inpatient Hospital Stay
Admission: AD | Admit: 2020-04-01 | Discharge: 2020-04-10 | DRG: 853 | Disposition: A | Discharge status: Skilled Nursing Facility | Payer: Medicare PPO | Source: Other Acute Inpatient Hospital | Attending: Internal Medicine | Admitting: Internal Medicine

## 2020-04-01 ENCOUNTER — Encounter (HOSPITAL_COMMUNITY): Payer: Self-pay | Admitting: Internal Medicine

## 2020-04-01 DIAGNOSIS — A401 Sepsis due to streptococcus, group B: Principal | ICD-10-CM | POA: Diagnosis present

## 2020-04-01 DIAGNOSIS — E43 Unspecified severe protein-calorie malnutrition: Secondary | ICD-10-CM | POA: Diagnosis present

## 2020-04-01 DIAGNOSIS — L97429 Non-pressure chronic ulcer of left heel and midfoot with unspecified severity: Secondary | ICD-10-CM | POA: Diagnosis present

## 2020-04-01 DIAGNOSIS — K219 Gastro-esophageal reflux disease without esophagitis: Secondary | ICD-10-CM | POA: Diagnosis present

## 2020-04-01 DIAGNOSIS — A409 Streptococcal sepsis, unspecified: Secondary | ICD-10-CM

## 2020-04-01 DIAGNOSIS — E875 Hyperkalemia: Secondary | ICD-10-CM | POA: Diagnosis present

## 2020-04-01 DIAGNOSIS — R9431 Abnormal electrocardiogram [ECG] [EKG]: Secondary | ICD-10-CM

## 2020-04-01 DIAGNOSIS — D649 Anemia, unspecified: Secondary | ICD-10-CM

## 2020-04-01 DIAGNOSIS — F419 Anxiety disorder, unspecified: Secondary | ICD-10-CM | POA: Diagnosis present

## 2020-04-01 DIAGNOSIS — F329 Major depressive disorder, single episode, unspecified: Secondary | ICD-10-CM | POA: Diagnosis present

## 2020-04-01 DIAGNOSIS — E1110 Type 2 diabetes mellitus with ketoacidosis without coma: Secondary | ICD-10-CM | POA: Diagnosis present

## 2020-04-01 DIAGNOSIS — M86111 Other acute osteomyelitis, right shoulder: Secondary | ICD-10-CM | POA: Diagnosis present

## 2020-04-01 DIAGNOSIS — E1142 Type 2 diabetes mellitus with diabetic polyneuropathy: Secondary | ICD-10-CM | POA: Diagnosis present

## 2020-04-01 DIAGNOSIS — F1020 Alcohol dependence, uncomplicated: Secondary | ICD-10-CM | POA: Diagnosis present

## 2020-04-01 DIAGNOSIS — L97529 Non-pressure chronic ulcer of other part of left foot with unspecified severity: Secondary | ICD-10-CM | POA: Diagnosis present

## 2020-04-01 DIAGNOSIS — Z452 Encounter for adjustment and management of vascular access device: Secondary | ICD-10-CM

## 2020-04-01 DIAGNOSIS — D62 Acute posthemorrhagic anemia: Secondary | ICD-10-CM | POA: Diagnosis present

## 2020-04-01 DIAGNOSIS — E1169 Type 2 diabetes mellitus with other specified complication: Secondary | ICD-10-CM | POA: Diagnosis present

## 2020-04-01 DIAGNOSIS — L02413 Cutaneous abscess of right upper limb: Secondary | ICD-10-CM

## 2020-04-01 DIAGNOSIS — E11621 Type 2 diabetes mellitus with foot ulcer: Secondary | ICD-10-CM | POA: Diagnosis present

## 2020-04-01 DIAGNOSIS — E1122 Type 2 diabetes mellitus with diabetic chronic kidney disease: Secondary | ICD-10-CM | POA: Diagnosis present

## 2020-04-01 DIAGNOSIS — M60009 Infective myositis, unspecified site: Secondary | ICD-10-CM | POA: Diagnosis present

## 2020-04-01 DIAGNOSIS — L97519 Non-pressure chronic ulcer of other part of right foot with unspecified severity: Secondary | ICD-10-CM

## 2020-04-01 DIAGNOSIS — N183 Chronic kidney disease, stage 3 unspecified: Secondary | ICD-10-CM | POA: Diagnosis present

## 2020-04-01 DIAGNOSIS — M009 Pyogenic arthritis, unspecified: Secondary | ICD-10-CM

## 2020-04-01 DIAGNOSIS — Z20822 Contact with and (suspected) exposure to covid-19: Secondary | ICD-10-CM | POA: Diagnosis present

## 2020-04-01 HISTORY — DX: Depression, unspecified: F32.A

## 2020-04-01 HISTORY — DX: Type 2 diabetes mellitus without complications (CMS HCC): E11.9

## 2020-04-01 HISTORY — DX: Anxiety disorder, unspecified: F41.9

## 2020-04-01 HISTORY — DX: Gastro-esophageal reflux disease without esophagitis: K21.9

## 2020-04-01 HISTORY — DX: Polyneuropathy, unspecified: G62.9

## 2020-04-01 HISTORY — DX: Osteomyelitis, unspecified (CMS HCC): M86.9

## 2020-04-01 LAB — HEPATIC FUNCTION PANEL
ALBUMIN: 1.8 g/dL — ABNORMAL LOW (ref 3.4–4.8)
ALKALINE PHOSPHATASE: 109 U/L (ref 45–115)
ALT (SGPT): 6 U/L — ABNORMAL LOW (ref 10–55)
AST (SGOT): 13 U/L (ref 8–45)
BILIRUBIN DIRECT: 0.1 mg/dL (ref 0.1–0.4)
BILIRUBIN TOTAL: 0.2 mg/dL — ABNORMAL LOW (ref 0.3–1.3)
PROTEIN TOTAL: 6.9 g/dL (ref 6.0–8.0)

## 2020-04-01 LAB — ECG 12-LEAD
Atrial Rate: 84 {beats}/min
Calculated R Axis: -16 degrees
Calculated T Axis: 19 degrees
PR Interval: 158 ms
QRS Duration: 92 ms
QT Interval: 406 ms
QTC Calculation: 479 ms
Ventricular rate: 84 {beats}/min

## 2020-04-01 LAB — OSMOLALITY: OSMOLALITY, BLOOD: 304 mosm/kg — ABNORMAL HIGH (ref 280–300)

## 2020-04-01 LAB — VENOUS BLOOD GAS, CO-OX, LYTES, LACTATE REFLEX - INACTIVE
BASE DEFICIT: 2.5 mmol/L (ref ?–3.0)
BICARBONATE (VENOUS): 22.6 mmol/L (ref 22.0–26.0)
CARBOXYHEMOGLOBIN: 1.2 % (ref 0.0–2.5)
CHLORIDE: 110 mmol/L (ref 101–111)
GLUCOSE: 149 mg/dL — ABNORMAL HIGH (ref 60–105)
HEMOGLOBIN: 6.1 g/dL — CL (ref 12.0–18.0)
LACTATE: 0.9 mmol/L (ref 0.0–1.3)
MET-HEMOGLOBIN: 0 %
PCO2 (VENOUS): 44 mmHg (ref 41.00–51.00)
PH (VENOUS): 7.33 (ref 7.31–7.41)
PO2 (VENOUS): 37 mmHg (ref 35.0–50.0)
WHOLE BLOOD POTASSIUM: 4 mmol/L (ref 3.5–4.6)

## 2020-04-01 LAB — SEDIMENTATION RATE: ERYTHROCYTE SEDIMENTATION RATE (ESR): 124 mm/h — ABNORMAL HIGH (ref 0–15)

## 2020-04-01 LAB — CBC WITH DIFF
HCT: 19.9 % — ABNORMAL LOW (ref 38.9–52.0)
HGB: 6.3 g/dL — CL (ref 13.4–17.5)
MCH: 29.2 pg (ref 26.0–32.0)
MCHC: 31.7 g/dL (ref 31.0–35.5)
MCV: 92.1 fL (ref 78.0–100.0)
MPV: 9.5 fL (ref 8.7–12.5)
PLATELETS: 401 10*3/uL — ABNORMAL HIGH (ref 150–400)
RBC: 2.16 10*6/uL — ABNORMAL LOW (ref 4.50–6.10)
RDW-CV: 15 % (ref 11.5–15.5)
WBC: 11.3 10*3/uL — ABNORMAL HIGH (ref 3.7–11.0)

## 2020-04-01 LAB — MANUAL DIFF AND MORPHOLOGY-SYSMEX
BASOPHIL #: 0.11 10*3/uL (ref <=0.20)
BASOPHIL %: 1 %
EOSINOPHIL #: 0.34 10*3/uL (ref <=0.50)
EOSINOPHIL %: 3 %
LYMPHOCYTE #: 2.03 10*3/uL (ref 1.00–4.80)
LYMPHOCYTE %: 18 %
METAMYELOCYTE %: 2 %
MONOCYTE #: 0.23 10*3/uL (ref 0.20–1.10)
MONOCYTE %: 2 %
NEUTROPHIL #: 8.36 10*3/uL — ABNORMAL HIGH (ref 1.50–7.70)
NEUTROPHIL %: 74 %
NRBC FROM MANUAL DIFF: 0 per 100 WBC

## 2020-04-01 LAB — URINALYSIS, MACROSCOPIC
BILIRUBIN: NEGATIVE mg/dL
BLOOD: NEGATIVE mg/dL
COLOR: NORMAL
GLUCOSE: NEGATIVE mg/dL
KETONES: NEGATIVE mg/dL
LEUKOCYTES: NEGATIVE WBCs/uL
NITRITE: NEGATIVE
PH: 6 (ref 5.0–8.0)
PROTEIN: 100 mg/dL — AB
SPECIFIC GRAVITY: 1.013 (ref 1.005–1.030)
UROBILINOGEN: NEGATIVE mg/dL

## 2020-04-01 LAB — HEPATITIS B SURFACE ANTIBODY: HBV SURFACE ANTIBODY QUANTITATIVE: 1 m[IU]/mL (ref <8)

## 2020-04-01 LAB — BASIC METABOLIC PANEL
ANION GAP: 7 mmol/L (ref 4–13)
BUN/CREA RATIO: 10 (ref 6–22)
BUN: 13 mg/dL (ref 8–25)
CALCIUM: 8.9 mg/dL (ref 8.8–10.2)
CHLORIDE: 111 mmol/L (ref 96–111)
CO2 TOTAL: 23 mmol/L (ref 23–31)
CREATININE: 1.36 mg/dL — ABNORMAL HIGH (ref 0.75–1.35)
ESTIMATED GFR: 56 mL/min/BSA — ABNORMAL LOW (ref >=60)
GLUCOSE: 151 mg/dL — ABNORMAL HIGH (ref 65–125)
POTASSIUM: 4.1 mmol/L (ref 3.5–5.1)
SODIUM: 141 mmol/L (ref 136–145)

## 2020-04-01 LAB — TYPE AND SCREEN

## 2020-04-01 LAB — LIPID PANEL
CHOL/HDL RATIO: 2.8
CHOLESTEROL: 131 mg/dL (ref 100–200)
HDL CHOL: 47 mg/dL — ABNORMAL LOW (ref >=50)
LDL CALC: 51 mg/dL (ref <100)
NON-HDL: 84 mg/dL (ref <=190)
TRIGLYCERIDES: 167 mg/dL — ABNORMAL HIGH (ref <150)
VLDL CALC: 33 mg/dL — ABNORMAL HIGH (ref <30)

## 2020-04-01 LAB — URINALYSIS, MICROSCOPIC
HYALINE CASTS: 1 /LPF (ref <4.0)
RBCS: <1 /HPF (ref <6.0)
WBCS: 1 /HPF (ref <4.0)

## 2020-04-01 LAB — ABO & RH: ABO/RH(D): O POS

## 2020-04-01 LAB — HIV1/HIV2 SCREEN, COMBINED ANTIGEN AND ANTIBODY: HIV SCREEN, COMBINED ANTIGEN & ANTIBODY: NEGATIVE

## 2020-04-01 LAB — VENOUS BLOOD GAS, CO-OX, LYTES, LACTATE REFLEX
%FIO2 (VENOUS): 99 %
IONIZED CALCIUM: 1.24 mmol/L
O2CT: 5.7 %
OXYHEMOGLOBIN: 66.3 %
SODIUM: 141 mmol/L

## 2020-04-01 LAB — URIC ACID: URIC ACID: 6.6 mg/dL (ref 3.5–7.5)

## 2020-04-01 LAB — PHOSPHORUS: PHOSPHORUS: 4.5 mg/dL — ABNORMAL HIGH (ref 2.3–4.0)

## 2020-04-01 LAB — MAGNESIUM: MAGNESIUM: 1.7 mg/dL — ABNORMAL LOW (ref 1.8–2.6)

## 2020-04-01 LAB — DRUG SCREEN, WITH CONFIRMATION, URINE
AMPHETAMINES, URINE: NEGATIVE
BARBITURATES URINE: NEGATIVE
BENZODIAZEPINES URINE: NEGATIVE
BUPRENORPHINE URINE: NEGATIVE
CANNABINOIDS URINE: NEGATIVE
COCAINE METABOLITES URINE: NEGATIVE
CREATININE RANDOM URINE: 50 mg/dL (ref 50–100)
ECSTASY/MDMA URINE: NEGATIVE
FENTANYL, RANDOM URINE: NEGATIVE
METHADONE URINE: NEGATIVE
OPIATES URINE (LOW CUTOFF): POSITIVE — AB
OXYCODONE URINE: POSITIVE — AB

## 2020-04-01 LAB — CROSSMATCH RED CELLS - UNITS
UNIT DIVISION: 0
UNIT DIVISION: 0
UNIT DIVISION: 0

## 2020-04-01 LAB — POC BLOOD GLUCOSE (RESULTS)
GLUCOSE, POC: 117 mg/dL — ABNORMAL HIGH (ref 70–105)
GLUCOSE, POC: 190 mg/dL — ABNORMAL HIGH (ref 70–105)

## 2020-04-01 LAB — B-TYPE NATRIURETIC PEPTIDE (BNP),PLASMA: BNP: 116 pg/mL — ABNORMAL HIGH (ref <=100)

## 2020-04-01 LAB — THYROID STIMULATING HORMONE WITH FREE T4 REFLEX: TSH: 2.03 u[IU]/mL (ref 0.430–3.550)

## 2020-04-01 LAB — COVID-19 ~~LOC~~ MOLECULAR LAB TESTING: SARS-CoV-2: NEGATIVE

## 2020-04-01 LAB — VANCOMYCIN, RANDOM: VANCOMYCIN RANDOM: 11.9 ug/mL — ABNORMAL LOW (ref 30.0–40.0)

## 2020-04-01 LAB — C-REACTIVE PROTEIN(CRP),INFLAMMATION: CRP INFLAMMATION: 47.5 mg/L — ABNORMAL HIGH (ref <8.0)

## 2020-04-01 LAB — CREATINE KINASE (CK), TOTAL, SERUM OR PLASMA: CREATINE KINASE: 26 U/L — ABNORMAL LOW (ref 45–225)

## 2020-04-01 LAB — HEPATITIS B SURFACE ANTIGEN: HBV SURFACE ANTIGEN QUALITATIVE: NEGATIVE

## 2020-04-01 LAB — PT/INR
INR: 0.99 (ref 0.80–1.20)
PROTHROMBIN TIME: 11.4 s (ref 9.1–13.9)

## 2020-04-01 LAB — PTT (PARTIAL THROMBOPLASTIN TIME): APTT: 34.4 s (ref 24.2–37.5)

## 2020-04-01 LAB — GAMMA GT: GGT: 52 U/L — ABNORMAL HIGH (ref 7–50)

## 2020-04-01 LAB — PROCALCITONIN ALGORITHM: PROCALCITONIN: 0.22 ng/mL (ref <0.50)

## 2020-04-01 LAB — LIPASE: LIPASE: 28 U/L (ref 10–60)

## 2020-04-01 LAB — HEPATITIS B CORE ANTIBODY: HBV CORE TOTAL ANTIBODIES: NEGATIVE

## 2020-04-01 LAB — TROPONIN-I: TROPONIN I: 12 ng/L (ref 0–30)

## 2020-04-01 MED ORDER — METRONIDAZOLE 250 MG TABLET
500.0000 mg | ORAL_TABLET | Freq: Three times a day (TID) | ORAL | Status: DC
Start: 2020-04-01 — End: 2020-04-10
  Administered 2020-04-01 – 2020-04-10 (×28): 500 mg via ORAL
  Filled 2020-04-01 (×28): qty 2

## 2020-04-01 MED ORDER — OXYCODONE 10 MG/0.5 ML ORAL SYRINGE (FOR ORAL USE ONLY)
5.0000 mg | INJECTION | ORAL | Status: DC | PRN
Start: 2020-04-01 — End: 2020-04-03

## 2020-04-01 MED ORDER — DIAZEPAM 5 MG/ML INJECTION SYRINGE
5.0000 mg | INJECTION | Freq: Once | INTRAMUSCULAR | Status: AC
Start: 2020-04-01 — End: 2020-04-02
  Administered 2020-04-02: 5 mg via INTRAVENOUS

## 2020-04-01 MED ORDER — SODIUM CHLORIDE 0.9 % INTRAVENOUS PIGGYBACK
2.0000 g | INJECTION | Freq: Two times a day (BID) | INTRAVENOUS | Status: DC
Start: 2020-04-02 — End: 2020-04-10
  Administered 2020-04-02: 2 g via INTRAVENOUS
  Administered 2020-04-02 (×2): 0 g via INTRAVENOUS
  Administered 2020-04-02: 2 g via INTRAVENOUS
  Administered 2020-04-03 (×2): 0 g via INTRAVENOUS
  Administered 2020-04-03 (×2): 2 g via INTRAVENOUS
  Administered 2020-04-04: 0 g via INTRAVENOUS
  Administered 2020-04-04 (×2): 2 g via INTRAVENOUS
  Administered 2020-04-04 – 2020-04-05 (×2): 0 g via INTRAVENOUS
  Administered 2020-04-05: 2 g via INTRAVENOUS
  Administered 2020-04-05: 0 g via INTRAVENOUS
  Administered 2020-04-05: 2 g via INTRAVENOUS
  Administered 2020-04-06: 0 g via INTRAVENOUS
  Administered 2020-04-06 (×2): 2 g via INTRAVENOUS
  Administered 2020-04-06: 0 g via INTRAVENOUS
  Administered 2020-04-07: 2 g via INTRAVENOUS
  Administered 2020-04-07 (×2): 0 g via INTRAVENOUS
  Administered 2020-04-07 – 2020-04-08 (×2): 2 g via INTRAVENOUS
  Administered 2020-04-08 (×2): 0 g via INTRAVENOUS
  Administered 2020-04-08: 2 g via INTRAVENOUS
  Administered 2020-04-09 (×2): 0 g via INTRAVENOUS
  Administered 2020-04-09 (×2): 2 g via INTRAVENOUS
  Administered 2020-04-10: 0 g via INTRAVENOUS
  Administered 2020-04-10: 2 g via INTRAVENOUS
  Administered 2020-04-10: 0 g via INTRAVENOUS
  Filled 2020-04-01 (×8): qty 12.5
  Filled 2020-04-01: qty 100
  Filled 2020-04-01 (×10): qty 12.5

## 2020-04-01 MED ORDER — OXYCODONE 10 MG/0.5 ML ORAL SYRINGE (FOR ORAL USE ONLY)
10.0000 mg | INJECTION | ORAL | Status: DC | PRN
Start: 2020-04-01 — End: 2020-04-03
  Administered 2020-04-01 – 2020-04-03 (×10): 10 mg via ORAL
  Filled 2020-04-01 (×10): qty 0.5

## 2020-04-01 MED ORDER — LIDOCAINE 3.6 %-MENTHOL 1.25 % TOPICAL PATCH
1.0000 | MEDICATED_PATCH | Freq: Every day | CUTANEOUS | Status: DC
Start: 2020-04-01 — End: 2020-04-10
  Administered 2020-04-01 – 2020-04-02 (×2): 1 via TRANSDERMAL
  Administered 2020-04-03: 0 via TRANSDERMAL
  Administered 2020-04-04 – 2020-04-10 (×7): 1 via TRANSDERMAL
  Filled 2020-04-01 (×9): qty 1

## 2020-04-01 MED ORDER — BUPROPION HCL SR 150 MG TABLET,12 HR SUSTAINED-RELEASE
150.0000 mg | ORAL_TABLET | Freq: Every day | ORAL | Status: DC
Start: 2020-04-02 — End: 2020-04-06
  Administered 2020-04-02 – 2020-04-06 (×5): 150 mg via ORAL
  Filled 2020-04-01 (×5): qty 1

## 2020-04-01 MED ORDER — INSULIN GLARGINE (U-100) 100 UNIT/ML SUBCUTANEOUS SOLUTION
25.0000 [IU] | Freq: Every evening | SUBCUTANEOUS | Status: DC
Start: 2020-04-01 — End: 2020-04-10
  Administered 2020-04-01 – 2020-04-09 (×9): 25 [IU] via SUBCUTANEOUS
  Filled 2020-04-01 (×10): qty 25

## 2020-04-01 MED ORDER — FAMOTIDINE 20 MG TABLET
40.0000 mg | ORAL_TABLET | Freq: Every evening | ORAL | Status: DC
Start: 2020-04-01 — End: 2020-04-10
  Administered 2020-04-01 – 2020-04-09 (×9): 40 mg via ORAL
  Filled 2020-04-01 (×9): qty 2

## 2020-04-01 MED ORDER — HYDROXYZINE PAMOATE 25 MG CAPSULE
25.0000 mg | ORAL_CAPSULE | Freq: Four times a day (QID) | ORAL | Status: DC | PRN
Start: 2020-04-01 — End: 2020-04-10
  Administered 2020-04-02 – 2020-04-10 (×14): 25 mg via ORAL
  Filled 2020-04-01 (×14): qty 1

## 2020-04-01 MED ORDER — SODIUM CHLORIDE 0.9 % INTRAVENOUS SOLUTION
6.0000 mg/kg | INTRAVENOUS | Status: DC
Start: 2020-04-01 — End: 2020-04-10
  Administered 2020-04-01: 0 mg via INTRAVENOUS
  Administered 2020-04-01 – 2020-04-02 (×2): 450 mg via INTRAVENOUS
  Administered 2020-04-02 – 2020-04-03 (×2): 0 mg via INTRAVENOUS
  Administered 2020-04-03: 450 mg via INTRAVENOUS
  Administered 2020-04-04: 0 mg via INTRAVENOUS
  Administered 2020-04-04: 450 mg via INTRAVENOUS
  Administered 2020-04-05: 0 mg via INTRAVENOUS
  Administered 2020-04-05: 450 mg via INTRAVENOUS
  Administered 2020-04-06: 0 mg via INTRAVENOUS
  Administered 2020-04-06 – 2020-04-07 (×2): 450 mg via INTRAVENOUS
  Administered 2020-04-07: 0 mg via INTRAVENOUS
  Administered 2020-04-08: 450 mg via INTRAVENOUS
  Administered 2020-04-08 – 2020-04-09 (×2): 0 mg via INTRAVENOUS
  Administered 2020-04-09 – 2020-04-10 (×2): 450 mg via INTRAVENOUS
  Administered 2020-04-10: 0 mg via INTRAVENOUS
  Filled 2020-04-01 (×10): qty 9

## 2020-04-01 MED ORDER — SODIUM CHLORIDE 0.9 % (FLUSH) INJECTION SYRINGE
2.0000 mL | INJECTION | Freq: Three times a day (TID) | INTRAMUSCULAR | Status: DC
Start: 2020-04-01 — End: 2020-04-10
  Administered 2020-04-01: 6 mL
  Administered 2020-04-01 – 2020-04-02 (×2): 2 mL
  Administered 2020-04-02: 6 mL
  Administered 2020-04-02 – 2020-04-03 (×2): 2 mL
  Administered 2020-04-03: 0 mL
  Administered 2020-04-03: 2 mL
  Administered 2020-04-06 – 2020-04-07 (×3): 0 mL
  Administered 2020-04-07 – 2020-04-08 (×3): 2 mL
  Administered 2020-04-08: 6 mL
  Administered 2020-04-09 – 2020-04-10 (×3): 2 mL

## 2020-04-01 MED ORDER — THIAMINE HCL (VITAMIN B1) 100 MG TABLET
100.0000 mg | ORAL_TABLET | Freq: Every day | ORAL | Status: DC
Start: 2020-04-01 — End: 2020-04-10
  Administered 2020-04-01 – 2020-04-10 (×10): 100 mg via ORAL
  Filled 2020-04-01 (×10): qty 1

## 2020-04-01 MED ORDER — SODIUM CHLORIDE 0.9 % IV BOLUS
40.0000 mL | INJECTION | Freq: Once | Status: AC | PRN
Start: 2020-04-01 — End: 2020-04-01

## 2020-04-01 MED ORDER — POLYETHYLENE GLYCOL 3350 17 GRAM ORAL POWDER PACKET
17.0000 g | Freq: Every day | ORAL | Status: DC
Start: 2020-04-01 — End: 2020-04-10
  Administered 2020-04-01 – 2020-04-03 (×3): 17 g via ORAL
  Administered 2020-04-04 – 2020-04-07 (×4): 0 g via ORAL
  Administered 2020-04-08: 17 g via ORAL
  Administered 2020-04-09 – 2020-04-10 (×2): 0 g via ORAL
  Filled 2020-04-01 (×5): qty 1

## 2020-04-01 MED ORDER — GABAPENTIN 400 MG CAPSULE
400.0000 mg | ORAL_CAPSULE | Freq: Two times a day (BID) | ORAL | Status: DC
Start: 2020-04-01 — End: 2020-04-05
  Administered 2020-04-01 – 2020-04-05 (×10): 400 mg via ORAL
  Filled 2020-04-01 (×10): qty 1

## 2020-04-01 MED ORDER — SENNOSIDES 8.6 MG-DOCUSATE SODIUM 50 MG TABLET
1.0000 | ORAL_TABLET | Freq: Two times a day (BID) | ORAL | Status: DC
Start: 2020-04-01 — End: 2020-04-02
  Administered 2020-04-01: 1 via ORAL
  Administered 2020-04-02: 0 via ORAL
  Filled 2020-04-01: qty 1

## 2020-04-01 MED ORDER — SODIUM CHLORIDE 0.9 % INTRAVENOUS PIGGYBACK
2.0000 g | INJECTION | INTRAVENOUS | Status: AC
Start: 2020-04-01 — End: 2020-04-01
  Administered 2020-04-01: 2 g via INTRAVENOUS
  Administered 2020-04-01: 0 g via INTRAVENOUS
  Filled 2020-04-01: qty 12.5

## 2020-04-01 MED ORDER — INSULIN LISPRO 100 UNIT/ML SUB-Q SSIP
0.0000 [IU] | INJECTION | Freq: Four times a day (QID) | SUBCUTANEOUS | Status: DC | PRN
Start: 2020-04-01 — End: 2020-04-10
  Administered 2020-04-01: 3 [IU] via SUBCUTANEOUS
  Administered 2020-04-02: 6 [IU] via SUBCUTANEOUS
  Administered 2020-04-02 – 2020-04-03 (×2): 9 [IU] via SUBCUTANEOUS
  Administered 2020-04-03: 6 [IU] via SUBCUTANEOUS
  Administered 2020-04-03: 9 [IU] via SUBCUTANEOUS
  Administered 2020-04-04 – 2020-04-10 (×11): 3 [IU] via SUBCUTANEOUS
  Filled 2020-04-01: qty 300

## 2020-04-01 MED ORDER — SODIUM CHLORIDE 0.9 % (FLUSH) INJECTION SYRINGE
2.0000 mL | INJECTION | INTRAMUSCULAR | Status: DC | PRN
Start: 2020-04-01 — End: 2020-04-10

## 2020-04-01 NOTE — Nurses Notes (Signed)
Patient arrived by direct admit from [facility]'. Patient arrived with right shoulder dressing in place with dressing and 3 large Hemovacs placed prior to arrival. . Patient with diabetic wound to left heel. Dressing in place.Patient complaining of lefttshoulder discomfort. Patient with orders for bloods; cultures and urine. PICC line ordered to be checked before use as red port is not working at all. Covid testing also ordered. Patient receiving EKG;xrays. Remains NPO at this time. Questioning surgery today. Ortho to check on patient.

## 2020-04-01 NOTE — Progress Notes (Addendum)
Patient arrived as a direct admission and assigned to team [id number] . The team has been informed of the patient's arrival and will come to bedside to evaluate. Please call the team with any questions or if patient has any change in clinical status.           Nonie Hoyer, MD  04/01/2020, 12:37

## 2020-04-01 NOTE — H&P (Addendum)
[name] [name]  General Medicine  Admission H&P    Date of Service:  04/01/2020  [name] [name], 59 y.o. male  Date of Admission:  04/01/2020  Date of Birth:  May 15, 1961  PCP: [place] from: patient and history reviewed via medical record  Chief Complaint:  Osteomyelitis of right shoulder.    HPI: [name] [name] is a 59 y.o., White male with a history of anxiety, depression, diabetes with peripheral neuropathy, GERD, and diabetic foot wounds admitted as a direct admission from [facility] secondary to left shoulder osteomyelitis.  The patient was originally admitted to [facility] on 03/08/2020 complaining of right shoulder pain after fall. He was treated for DKA and was noted that he a has a left heel diabetic foot infection with known osteomyelitis and follows with Dr. Eulas Post from [facility] outpatient.  He was started on IV vanc, cefepime, and Flagyl and surgeon evaluated the wound on his left heel which was not felt to need surgical intervention.  Blood cultures were positive for group B strep and a PICC line was given for 6 weeks of antibiotics with stop date of 05/22/2020.  His shoulder continued to get worse and MRI showed extensive abscess involving multiple regional muscles as well as subacromial and subdeltoid bursa.  There was also questionable osteomyelitis of the proximal humerus and humeral head noted on the MRI.  Orthopedics performed irrigation on 07/07 the any drainage in debridement on 07/15 with the packing change on 07/17.  Patient was taken back to the OR as the MRI showed worsening abscesses and drains were placed in all abscesses that could be accessed.  Dr. Marcelino Scot of orthopedics felt that he should be transferred to [facility] as he has not responded as expected to aggressive treatment and further work on the back of the costal border of the scapula is outside the scope of his practice.    PAST MEDICAL:    Past Medical History:   Diagnosis  Date    Anxiety     Depression     Diabetes (CMS HCC)     Diabetes mellitus, type 2 (CMS HCC)     GERD (gastroesophageal reflux disease)     MRSA (methicillin resistant staph aureus) culture positive 08/06/2019    L eye cellulitis    Osteomyelitis (CMS HCC)     Peripheral neuropathy         Past Surgical History:   Procedure Laterality Date    MANDIBLE SURGERY      SHOULDER SURGERY              Medications Prior to Admission     Prescriptions    acetaminophen (TYLENOL) 325 mg Oral Tablet    Take 2 Tabs (650 mg total) by mouth Every 4 hours as needed    buPROPion (WELLBUTRIN SR) 150 mg Oral tablet sustained-release 12 hr    Take 150 mg by mouth Once a day    famotidine (PEPCID) 40 mg Oral Tablet    Take 40 mg by mouth Every evening    gabapentin (NEURONTIN) 600 mg Oral Tablet    Take 1,200 mg by mouth Three times a day    hydrOXYzine pamoate (VISTARIL) 25 mg Oral Capsule    Take 25 mg by mouth Four times a day as needed for Anxiety    insulin glargine 100 unit/mL Subcutaneous injection (vial)    25 Units by Subcutaneous route Every night  oxyCODONE (ROXICODONE) 5 mg Oral Tablet    Take 1 Tab (5 mg total) by mouth Every 8 hours as needed Please take 1-2 tablets every 8 hours for pain as needed.    thiamine (VITAMIN B1) 100 mg Oral Tablet    Take 100 mg by mouth Once a day        No Known Allergies    Family History  Family Medical History:     Problem Relation (Age of Onset)    Diabetes Mother, Father        Social History  Social History     Tobacco Use    Smoking status: Former Smoker     Types: Cigars    Smokeless tobacco: Never Used   Substance Use Topics    Alcohol use: Yes     Comment: Drinks 3-4 beers daily.        Review of Systems:    General:  No fevers, chills, night sweats, fatigue, weakness, or unintentional weight changes.  Skin:  No skin, hair, or nail changes. No rashes, itching, sores, or lacerations.  HEENT:  No headache, tinnitus, nasal congestion, sore throat, or visual changes.  CV:  No palpitations, chest pain, or pressure.  Pulm: No shortness of breath, cough, or wheezing.  GI: No nausea, vomiting, diarrhea, melena, hematochezia, or hematemesis.   GU: No dysuria, hematuria, increased frequency, or discoloration.  Musculoskeletal: No deformities, weakness, or pain.  Breast: No breast tenderness, discharge, or new lumps.  Genitalia: No concerning lesions or discharge.   Heme/ Lymph: No anemia, easy bruising, bleeding, or enlarged nodes.  Endocrine: No polyuria, polydipsia, dry skin, thinning hair, hot/cold intolerance.  Neuro: No numbness, tingling, sensory loss, slurred speech, or tremors.  Psych: No anxiety or depression.    Examination:  I have reviewed all vitals.  Temperature: 36.9 C (98.4 F) Heart Rate: 84 BP (Non-Invasive): (!) 147/82 (rn notified)   Respiratory Rate: 16 SpO2: 99 %     Constitutional: appears chronically ill, pale, no distress and vital signs reviewed and discussed with patient  Eyes: Conjunctiva clear., Pupils equal and round, reactive to light and accomodation. , Sclera non-icteric.   ENT: ENMT without erythema or injection, mucous membranes moist.  Neck: no thyromegaly or lymphadenopathy and thyroid: not enlarged, symmetric, no tenderness/mass/nodules  Respiratory: Clear to auscultation and percussion bilaterally. , Thoracic excursion  Normal  Cardiovascular: regular rate and rhythm, S1, S2 normal, no murmur, click, rub or gallop     Pulses 2/4, trace pedal edema  Gastrointestinal: Soft, non-tender, Bowel sounds normal, non-distended  Genitourinary: Deferred  Musculoskeletal: Multiple drains coming from right shoulder.  Multiple staples from previous surgeries.  Integumentary:  Skin warm and dry and No rashes  Neurologic: Grossly normal, CN II - XII grossly intact , Alert and oriented x3, No tremor, No asterixis  Lymphatic/Immunologic/Hematologic: No lymphadenopathy  Psychiatric: Affect Normal  Glasgow: Total score only: 15  Coma (GCS Coma less than 8): Coma -Not  applicable    Labs: I have reviewed all lab results.  Lab Results Today:    Results for orders placed or performed during the hospital encounter of 04/01/20 (from the past 24 hour(s))   ECG 12-LEAD   Result Value Ref Range    Ventricular rate 84 BPM    Atrial Rate 84 BPM    PR Interval 158 ms    QRS Duration 92 ms    QT Interval 406 ms    QTC Calculation 479 ms    Calculated P  Axis       Calculated R Axis -16 degrees    Calculated T Axis 19 degrees   VENOUS BLOOD GAS, CO-OX, LYTES, LACTATE REFLEX   Result Value Ref Range    %FIO2 (VENOUS) 99.0 %    PH (VENOUS) 7.33 7.31 - 7.41    PCO2 (VENOUS) 44.00 41.00 - 51.00 mm/Hg    PO2 (VENOUS) 37.0 35.0 - 50.0 mm/Hg    BASE DEFICIT 2.5 -3.0 - 3.0 mmol/L    BICARBONATE (VENOUS) 22.6 22.0 - 26.0 mmol/L    LACTATE 0.9 0.0 - 1.3 mmol/L    SODIUM 141 mmol/L    WHOLE BLOOD POTASSIUM 4.0 3.5 - 4.6 mmol/L    CHLORIDE 110 101 - 111 mmol/L    IONIZED CALCIUM 1.24 mmol/L    GLUCOSE 149 (H) 60 - 105 mg/dL    HEMOGLOBIN 6.1 (LL) 12.0 - 18.0 g/dL    OXYHEMOGLOBIN 66.3 %    CARBOXYHEMOGLOBIN 1.2 0.0 - 2.5 %    MET-HEMOGLOBIN 0.0 %    O2CT 5.7 %   MAGNESIUM   Result Value Ref Range    MAGNESIUM 1.7 (L) 1.8 - 2.6 mg/dL   PHOSPHORUS   Result Value Ref Range    PHOSPHORUS 4.5 (H) 2.3 - 4.0 mg/dL   HEPATIC FUNCTION PANEL   Result Value Ref Range    ALBUMIN 1.8 (L) 3.4 - 4.8 g/dL     ALKALINE PHOSPHATASE 109 45 - 115 U/L    ALT (SGPT) 6 (L) 10 - 55 U/L    AST (SGOT)  13 8 - 45 U/L    BILIRUBIN TOTAL 0.2 (L) 0.3 - 1.3 mg/dL    BILIRUBIN DIRECT 0.1 0.1 - 0.4 mg/dL    PROTEIN TOTAL 6.9 6.0 - 8.0 g/dL   LIPASE   Result Value Ref Range    LIPASE 28 10 - 60 U/L   GAMMA GT   Result Value Ref Range    GGT 52 (H) 7 - 50 U/L   B-TYPE NATRIURETIC PEPTIDE   Result Value Ref Range    BNP 116 (H) <=100 pg/mL   PT/INR   Result Value Ref Range    PROTHROMBIN TIME 11.4 9.1 - 13.9 seconds    INR 0.99 0.80 - 1.20   PTT (PARTIAL THROMBOPLASTIN TIME)   Result Value Ref Range    APTT 34.4 24.2 - 37.5 seconds   BASIC  METABOLIC PANEL   Result Value Ref Range    SODIUM 141 136 - 145 mmol/L    POTASSIUM 4.1 3.5 - 5.1 mmol/L    CHLORIDE 111 96 - 111 mmol/L    CO2 TOTAL 23 23 - 31 mmol/L    ANION GAP 7 4 - 13 mmol/L    CALCIUM 8.9 8.8 - 10.2 mg/dL    GLUCOSE 151 (H) 65 - 125 mg/dL    BUN 13 8 - 25 mg/dL    CREATININE 1.36 (H) 0.75 - 1.35 mg/dL    BUN/CREA RATIO 10 6 - 22    ESTIMATED GFR 56 (L) >=60 mL/min/BSA   LIPID PANEL   Result Value Ref Range    CHOLESTEROL  131 100 - 200 mg/dL    HDL CHOL 47 (L) >=50 mg/dL    TRIGLYCERIDES 167 (H) <150 mg/dL    LDL CALC 51 <100 mg/dL    VLDL CALC 33 (H) <30 mg/dL    NON-HDL 84 <=190 mg/dL    CHOL/HDL RATIO 2.8    CREATINE KINASE (CK), TOTAL, SERUM  Result Value Ref Range    CREATINE KINASE 26 (L) 45 - 225 U/L   TROPONIN-I   Result Value Ref Range    TROPONIN I 12 0 - 30 ng/L   URIC ACID   Result Value Ref Range    URIC ACID 6.6 3.5 - 7.5 mg/dL   VANCOMYCIN, RANDOM   Result Value Ref Range    VANCOMYCIN RANDOM 11.9 (L) 30.0 - 40.0 ug/mL   C-REACTIVE PROTEIN(CRP),INFLAMMATION   Result Value Ref Range    CRP INFLAMMATION 47.5 (H) <8.0 mg/L   SEDIMENTATION RATE   Result Value Ref Range    ERYTHROCYTE SEDIMENTATION RATE (ESR) 124 (H) 0 - 15 mm/hr   CBC WITH DIFF   Result Value Ref Range    WBC 11.3 (H) 3.7 - 11.0 x103/uL    RBC 2.16 (L) 4.50 - 6.10 x106/uL    HGB 6.3 (LL) 13.4 - 17.5 g/dL    HCT 19.9 (L) 38.9 - 52.0 %    MCV 92.1 78.0 - 100.0 fL    MCH 29.2 26.0 - 32.0 pg    MCHC 31.7 31.0 - 35.5 g/dL    RDW-CV 15.0 11.5 - 15.5 %    PLATELETS 401 (H) 150 - 400 x103/uL    MPV 9.5 8.7 - 12.5 fL   MANUAL DIFF AND MORPHOLOGY-SYSMEX   Result Value Ref Range    NEUTROPHIL % 74 %    LYMPHOCYTE %  18 %    MONOCYTE % 2 %    EOSINOPHIL % 3 %    BASOPHIL % 1 %    METAMYELOCYTE %  2 %    NEUTROPHIL # 8.36 (H) 1.50 - 7.70 x103/uL    LYMPHOCYTE # 2.03 1.00 - 4.80 x103/uL    MONOCYTE # 0.23 0.20 - 1.10 x103/uL    EOSINOPHIL # 0.34 <=0.50 x103/uL    BASOPHIL # 0.11 <=0.20 x103/uL    NRBC FROM MANUAL DIFF  0 per 100 WBC    HYPOCHROMASIA 2+/Moderate (A) None       Imaging Studies:     DNR Status:  Full Code    Assessment/Plan:   Active Hospital Problems    Diagnosis    Abscess of right shoulder     [name] [name] is a 59 y.o., White male with a history of anxiety, depression, diabetes with peripheral neuropathy, GERD, and diabetic foot wounds admitted as a direct admission from [facility] secondary to left shoulder osteomyelitis secondary to Streptococcus sepsis likely from left calcaneal diabetic foot wound/osteomyelitis.    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  -on Dapto, cefepime, and Flagyl.  Oxycodone 5 or 10 for pain.  -repeat blood cultures here including line sepsis protocol.  -will keep patient NPO for now although surgery likely will not happen immediately.  -orthopedics has been consulted.    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  Advanced wound care consulted.  -patient has known osteo on the left heel.  Was following with Podiatry outpatient.  -continue on Depo, cefepime, and Flagyl for with end date originally 9/13.  -patient will likely need a new end date now that he has multiple abscesses in his right shoulder.  -will consult Podiatry.  -stop drinking.    Thrombocytosis  -not high enough for aspirin.  -continue to monitor and treat osteomyelitis.    Anemia, likely  of acute blood loss and chronic disease  -type and cross 2 units and transfuse now.  -stop drinking.    CKD stage 3  -continue to monitor.  Avoid any nephrotoxic medication.  -stop drinking.    Anxiety/depression  -continue bupropion and Vistaril.  -stop drinking.    Diabetes with peripheral neuropathy  -continue Lantus 25 units nightly.  -SSIP.  -continue Neurontin but at reduced dosage due to CKD stage 3.      Alcohol use  disorder, recurrent severe  -patient states he only drinks 3-4 beers daily now.  -denies any withdrawal symptoms or history of seizures.  -patient does have a history two arrests related to alcohol.  -continue thiamine.  -stop drinking.    Physical deconditioning  -[facility] notes state that patient is a 2 person assist.  -PT OT pending.    GERD  -continue famotidine.    Advanced care planning [phone] (first 30 minutes)  -advance care planning including but not limited to explanation and discussion of advance directives such as standard forms (with completion of such forms, when performed), by physician or APP.    DVT/PE Prophylaxis: Heparin    Disposition:  PT OT pending.    Randie Heinz, DO  Assistant Professor   Department of Internal Medicine

## 2020-04-01 NOTE — Pharmacy (Signed)
[place] / Department of Pharmaceutical Services  Therapeutic Drug Monitoring: Vancomycin  04/01/2020      Patient name: [name] [name], [name] [name]  Date of Birth:  17-Feb-1961    Actual Weight:  Weight: 83.6 kg (184 lb 4.9 oz) (04/01/20 1240)     BMI:  BMI (Calculated): 25.76 (04/01/20 1240)    Date RPh Current regimen (including mg/kg) Indication AUC or trough based dosing Target Levels^ SCr (mg/dL) CrCl* (mL/min) Measured level(s)   (mcg/mL) Calculated AUC (if AUC based monitoring) Plan (including when levels are due) Comments   7/24 MLW N/a received at [facility] - - - - Random: 11.9 - Switched to daptomycin per MD                                                                                          Barrie Folk Target depends on dosing and monitoring method, AUC vs. trough based. For AUC based dosing units are mg*h/L. For trough based dosing units are mcg/mL.     *Creatinine clearance is estimated by using the Cockcroft-Gault equation for adult patients and the Carol Ada for pediatric patients.    The decision to discontinue vancomycin therapy will be determined by the primary service.  Please contact the pharmacist with any questions regarding this patient's medication regimen.

## 2020-04-01 NOTE — Consults (Signed)
[facility]  Department of Orthopaedics  H&P / Consult Note  Date of Service: 04/01/2020      Patient: [name] [name]  MRN: [id number]  DOB: [birth year]    Admission Date: 04/01/2020  Ortho Staff: Dr. Mendel Ryder, MD  Requesting Service/Staff: Medicine    PCP:   [facility]    Consult Reason:   Right shoulder infection/ abscesses     HPI:   [name] [name] is a 59 y.o. male with right shoulder infection/ abscesses. Incident initially occred on 06/30 where he was admitted to [facility] due to R shoulder osteomyelitis. It was noted he had a L heel diabetic foot infection and was started on IV Vanc, cefepime, and flagyl. Shoulder continiued to worsen, MRI was obtained and showed abscess in multiple muscles, subaromial and subdeltoid bursa. Irrigation was done on the R shoulder on 07/07 and again on 07/17. Subsequent MRI showed worsening abscesses, drains were placed. At that point, Dr. Marcelino Scot, he provider at the time, thought he should be transferred to [facility] for more intensive treatment. Denies any numbness or paresthesias , pain or injury to other extremities, back pain, or any other symptoms or complaints.    Denies any recent fever, chills, CP, SOB, N/V/D, change in urinary or bowel habits, or any other symptoms or complaints.    PMH:  - Diabetes  - GERD  - Peripheral neuropathy   - Anxiety    PSH:  - Has had "jaw surgery" in the past  - Shoulder surgeries recently performed at [facility]  - No h/o problems with anaesthesia    ALLERGIES:  - NKDA    HOME MEDICATIONS:  - Anticoagulation = None  - Wellbutrin  - Famotidine  - Gabapentin  - Vistraril  - Glargine  - Oxycodone  - Thiamine    SH:   - Tobacco = Denies current use, former smoker  - Alcohol = 3-4 beers per day  - Drugs = Denies    FH:  - No family h/o anesthesia problems per patient's knowledge  - No family h/o DVT's per patient's knowledge     ROS:   Per HPI, otherwise negative    PHYSICAL EXAM:              Gen: Alert and cooperative  with exam, NAD  Integument: Warm and dry  Neuro: Facies symmetric  Psych: Mood and affect congruent with clinical situation   HEENT: NC/AT  Resp: Non-labored breathing, able to speak without difficulty  Msk:  EXTREMITY= RUE  - Deformity: No obvious bony deformity appreciated   - Skin / Wounds: Large incision over anterior shoulder, stitches intact. Image provided below. Small incisions on back of shoulder. Images provided below.    - Pain: TTP over R shoulder, Painful motion of the shoulder   - Sensory: SILT throughout (U/M/R nerve distributions)  - Motor: Intact (+AIN/PIN/U incl. ok sign, thumbs up, finger cross, finger spread)  - Finger flexors/extensors: Intact FDP, FDS, extensors all digits  - Vascular: Palpable pulses (2+ R)            EXTREMITY= LUE  - Deformity: No obvious deformity appreciated   - Skin / Wounds: No ecchymosis or swelling, No open wounds   - Pain: Non-TTP throughout extremity, No pain with passive ROM of all joints  - Sensory: SILT throughout (U/M/R nerve distributions)  - Motor: Intact (+AIN/PIN/U incl. ok sign, thumbs up, finger cross, finger spread)  - Finger flexors/extensors: Intact FDP, FDS, extensors all digits  -  Vascular: Palpable pulses (2+ R)    EXTREMITY= LLE   - Deformity: No obvious bony deformity appreciated   - Skin / Wounds: Large diabetic ulcer over left heel. Image provided below.   - Pain: Non-TTP throughout extremity, No pain with passive ROM of all joints  - Sensory: SILT throughout (S/S/T/DP/SP nerve distributions)  - Motor: Intact (+EHL/FHL/ADF/APF)  - Vascular: Palpable pulses (2+ DP/PT)        EXTREMITY= RLE   - Deformity: No obvious bony deformity appreciated   - Skin / Wounds: Skin disruption of the heel, image provided below   - Pain: Non-TTP throughout extremity, No pain with passive ROM of all joints  - Sensory: SILT throughout (S/S/T/DP/SP nerve distributions)  - Motor: Intact (+EHL/FHL/ADF/APF)  - Vascular: Palpable pulses (2+ DP/PT)          PERTINENT LABS:      - WBC: 11.3  - Hb: 6.3  - INR: 0.99  - ESR: 124  - CRP: 47.5    ASSESSMENT:  59 y.o. male with R shoulder infection/abscess      PLAN/RECOMMENDATIONS:     - Please place "ortho consult" in Epic.  - Plan for OR 07/25 for R shoulder washout with possible antibiotic spacer. Consented.  - Weightbearing Status: NWB RUE  - PT/OT: OOB with PT/OT after surgery  - DVT prophylaxis: Encourage ROM of all uninvolved extremities; hold chemoprophylaxis for OR  - Diet: NPO for now    Barry Brunner, MD  Resident, PGY-1  Department of Orthopaedics  Pager [phone]  04/01/2020 15:06      Discussed plan of care over phone and agree with above note after review.    Enis Slipper, MD  04/02/2020, 10:26

## 2020-04-01 NOTE — Nurses Notes (Signed)
First unit of packed cells started. Instructed on transfusion reactions.Has had blood before.

## 2020-04-01 NOTE — Ancillary Notes (Signed)
[facility]  MRI Technologist Note        MRI on hold - until patient is done receiving blood transfusion .        Elvina Mattes, RT (R)(MR) 04/01/2020, 19:40

## 2020-04-02 ENCOUNTER — Encounter (HOSPITAL_COMMUNITY)
Admission: AD | Disposition: A | Discharge status: Skilled Nursing Facility | Payer: Self-pay | Source: Other Acute Inpatient Hospital | Attending: Internal Medicine

## 2020-04-02 ENCOUNTER — Inpatient Hospital Stay (HOSPITAL_COMMUNITY): Payer: Medicare PPO | Admitting: Student in an Organized Health Care Education/Training Program

## 2020-04-02 ENCOUNTER — Inpatient Hospital Stay (HOSPITAL_COMMUNITY): Payer: Medicare PPO

## 2020-04-02 ENCOUNTER — Inpatient Hospital Stay (INDEPENDENT_AMBULATORY_CARE_PROVIDER_SITE_OTHER): Payer: Medicare PPO

## 2020-04-02 ENCOUNTER — Other Ambulatory Visit: Payer: Self-pay

## 2020-04-02 ENCOUNTER — Other Ambulatory Visit (HOSPITAL_COMMUNITY): Payer: Self-pay

## 2020-04-02 DIAGNOSIS — E118 Type 2 diabetes mellitus with unspecified complications: Secondary | ICD-10-CM

## 2020-04-02 DIAGNOSIS — M869 Osteomyelitis, unspecified: Secondary | ICD-10-CM

## 2020-04-02 DIAGNOSIS — E1169 Type 2 diabetes mellitus with other specified complication: Secondary | ICD-10-CM

## 2020-04-02 DIAGNOSIS — Z9889 Other specified postprocedural states: Secondary | ICD-10-CM

## 2020-04-02 DIAGNOSIS — M60011 Infective myositis, right shoulder: Secondary | ICD-10-CM

## 2020-04-02 DIAGNOSIS — M25411 Effusion, right shoulder: Secondary | ICD-10-CM

## 2020-04-02 DIAGNOSIS — B951 Streptococcus, group B, as the cause of diseases classified elsewhere: Secondary | ICD-10-CM

## 2020-04-02 DIAGNOSIS — L02413 Cutaneous abscess of right upper limb: Secondary | ICD-10-CM

## 2020-04-02 DIAGNOSIS — Z4682 Encounter for fitting and adjustment of non-vascular catheter: Secondary | ICD-10-CM

## 2020-04-02 DIAGNOSIS — E43 Unspecified severe protein-calorie malnutrition: Secondary | ICD-10-CM

## 2020-04-02 DIAGNOSIS — M86121 Other acute osteomyelitis, right humerus: Secondary | ICD-10-CM

## 2020-04-02 DIAGNOSIS — M868X1 Other osteomyelitis, shoulder: Secondary | ICD-10-CM

## 2020-04-02 DIAGNOSIS — M86621 Other chronic osteomyelitis, right  humerus: Secondary | ICD-10-CM

## 2020-04-02 DIAGNOSIS — S4981XA Other specified injuries of right shoulder and upper arm, initial encounter: Secondary | ICD-10-CM

## 2020-04-02 DIAGNOSIS — D638 Anemia in other chronic diseases classified elsewhere: Secondary | ICD-10-CM

## 2020-04-02 DIAGNOSIS — L02414 Cutaneous abscess of left upper limb: Secondary | ICD-10-CM

## 2020-04-02 LAB — CBC WITH DIFF
BASOPHIL #: 0.11 10*3/uL (ref <=0.20)
BASOPHIL %: 1 %
EOSINOPHIL #: 0.26 10*3/uL (ref <=0.50)
EOSINOPHIL %: 2 %
HCT: 30.4 % — ABNORMAL LOW (ref 38.9–52.0)
HGB: 9.8 g/dL — ABNORMAL LOW (ref 13.4–17.5)
IMMATURE GRANULOCYTE #: 0.3 10*3/uL — ABNORMAL HIGH (ref <0.10)
IMMATURE GRANULOCYTE %: 3 % — ABNORMAL HIGH (ref 0–1)
LYMPHOCYTE #: 1.36 10*3/uL (ref 1.00–4.80)
LYMPHOCYTE %: 12 %
MCH: 29.2 pg (ref 26.0–32.0)
MCHC: 32.2 g/dL (ref 31.0–35.5)
MCV: 90.5 fL (ref 78.0–100.0)
MONOCYTE #: 0.74 10*3/uL (ref 0.20–1.10)
MONOCYTE %: 7 %
MPV: 9.3 fL (ref 8.7–12.5)
NEUTROPHIL #: 8.33 10*3/uL — ABNORMAL HIGH (ref 1.50–7.70)
NEUTROPHIL %: 75 %
PLATELETS: 331 10*3/uL (ref 150–400)
RBC: 3.36 10*6/uL — ABNORMAL LOW (ref 4.50–6.10)
RDW-CV: 15 % (ref 11.5–15.5)
WBC: 11.1 10*3/uL — ABNORMAL HIGH (ref 3.7–11.0)

## 2020-04-02 LAB — BASIC METABOLIC PANEL, FASTING
ANION GAP: 6 mmol/L (ref 4–13)
BUN/CREA RATIO: 10 (ref 6–22)
BUN: 14 mg/dL (ref 8–25)
CALCIUM: 8.7 mg/dL — ABNORMAL LOW (ref 8.8–10.2)
CHLORIDE: 111 mmol/L (ref 96–111)
CO2 TOTAL: 23 mmol/L (ref 23–31)
CREATININE: 1.36 mg/dL — ABNORMAL HIGH (ref 0.75–1.35)
ESTIMATED GFR: 56 mL/min/BSA — ABNORMAL LOW (ref >=60)
GLUCOSE: 93 mg/dL (ref 70–99)
POTASSIUM: 4.6 mmol/L (ref 3.5–5.1)
SODIUM: 140 mmol/L (ref 136–145)

## 2020-04-02 LAB — MAGNESIUM: MAGNESIUM: 1.7 mg/dL — ABNORMAL LOW (ref 1.8–2.6)

## 2020-04-02 LAB — C-REACTIVE PROTEIN(CRP),INFLAMMATION: CRP INFLAMMATION: 54.7 mg/L — ABNORMAL HIGH (ref <8.0)

## 2020-04-02 LAB — POC BLOOD GLUCOSE (RESULTS)
GLUCOSE, POC: 101 mg/dL (ref 70–105)
GLUCOSE, POC: 88 mg/dL (ref 70–105)
GLUCOSE, POC: 144 mg/dL — ABNORMAL HIGH (ref 70–105)
GLUCOSE, POC: 245 mg/dL — ABNORMAL HIGH (ref 70–105)
GLUCOSE, POC: 272 mg/dL — ABNORMAL HIGH (ref 70–105)

## 2020-04-02 LAB — MRSA/MSSA MOLECULAR SCREEN, BLOOD
METHICILLIN RESISTANCE: NEGATIVE
STAPHYLOCOCCUS AUREUS: NEGATIVE

## 2020-04-02 LAB — PROCALCITONIN REFLEX 6HR: PROCALCITONIN: 0.18 ng/mL (ref <0.50)

## 2020-04-02 LAB — HGA1C (HEMOGLOBIN A1C WITH EST AVG GLUCOSE)
ESTIMATED AVERAGE GLUCOSE: 197 mg/dL
HEMOGLOBIN A1C: 8.5 % — ABNORMAL HIGH (ref 4.0–5.6)

## 2020-04-02 SURGERY — IRRIGATION AND DEBRIDEMENT SHOULDER
Anesthesia: General | Site: Knee | Laterality: Right | Wound class: Dirty or Infected Wounds-Include old traumatic wounds

## 2020-04-02 MED ORDER — MORPHINE 4 MG/ML INTRAVENOUS SOLUTION
INTRAVENOUS | Status: AC
Start: 2020-04-02 — End: 2020-04-02
  Filled 2020-04-02: qty 1

## 2020-04-02 MED ORDER — ONDANSETRON HCL (PF) 4 MG/2 ML INJECTION SOLUTION
INTRAMUSCULAR | Status: AC
Start: 2020-04-02 — End: 2020-04-02
  Filled 2020-04-02: qty 2

## 2020-04-02 MED ORDER — SODIUM CHLORIDE 0.9 % IRRIGATION SOLUTION
1000.0000 mL | Status: DC | PRN
Start: 2020-04-02 — End: 2020-04-02
  Administered 2020-04-02: 1000 mL

## 2020-04-02 MED ORDER — LACTATED RINGERS INTRAVENOUS SOLUTION
INTRAVENOUS | Status: DC
Start: 2020-04-02 — End: 2020-04-02

## 2020-04-02 MED ORDER — VANCOMYCIN 1,000 MG INTRAVENOUS INJECTION
INTRAVENOUS | Status: AC
Start: 2020-04-02 — End: 2020-04-02
  Filled 2020-04-02: qty 10

## 2020-04-02 MED ORDER — GENTAMICIN 40 MG/ML INJECTION FOR STIMULAN ANTIBIOTIC BEAD KIT
480.0000 mg | Freq: Once | Status: AC
Start: 2020-04-02 — End: 2020-04-02
  Administered 2020-04-02: 480 mg

## 2020-04-02 MED ORDER — LIDOCAINE (PF) 100 MG/5 ML (2 %) INTRAVENOUS SYRINGE
INJECTION | Freq: Once | INTRAVENOUS | Status: DC | PRN
Start: 2020-04-02 — End: 2020-04-02
  Administered 2020-04-02: 100 mg via INTRAVENOUS

## 2020-04-02 MED ORDER — MAGNESIUM SULFATE 2 GRAM/50 ML (4 %) IN WATER INTRAVENOUS PIGGYBACK
2.0000 g | INJECTION | Freq: Once | INTRAVENOUS | Status: AC
Start: 2020-04-02 — End: 2020-04-02
  Administered 2020-04-02: 0 g via INTRAVENOUS
  Administered 2020-04-02: 2 g via INTRAVENOUS
  Filled 2020-04-02 (×2): qty 50

## 2020-04-02 MED ORDER — FENTANYL (PF) 50 MCG/ML INJECTION SOLUTION
25.0000 ug | INTRAMUSCULAR | Status: DC | PRN
Start: 2020-04-02 — End: 2020-04-02
  Administered 2020-04-02 (×4): 25 ug via INTRAVENOUS
  Filled 2020-04-02: qty 2

## 2020-04-02 MED ORDER — MORPHINE 4 MG/ML INTRAVENOUS SOLUTION
2.0000 mg | INTRAVENOUS | Status: DC | PRN
Start: 2020-04-02 — End: 2020-04-02

## 2020-04-02 MED ORDER — SODIUM CHLORIDE 0.9 % (FLUSH) INJECTION SYRINGE
2.0000 mL | INJECTION | Freq: Three times a day (TID) | INTRAMUSCULAR | Status: DC
Start: 2020-04-02 — End: 2020-04-10
  Administered 2020-04-02: 0 mL
  Administered 2020-04-02 – 2020-04-03 (×3): 2 mL
  Administered 2020-04-03: 0 mL
  Administered 2020-04-04: 2 mL
  Administered 2020-04-04: 0 mL
  Administered 2020-04-05 (×2): 2 mL
  Administered 2020-04-06: 6 mL
  Administered 2020-04-06 – 2020-04-07 (×2): 2 mL
  Administered 2020-04-07: 6 mL
  Administered 2020-04-07 – 2020-04-10 (×8): 2 mL

## 2020-04-02 MED ORDER — PHENYLEPHRINE 1 MG/10 ML (100 MCG/ML) IN 0.9 % SOD.CHLORIDE IV SYRINGE
INJECTION | Freq: Once | INTRAVENOUS | Status: DC | PRN
Start: 2020-04-02 — End: 2020-04-02
  Administered 2020-04-02 (×2): 100 ug via INTRAVENOUS
  Administered 2020-04-02: 200 ug via INTRAVENOUS

## 2020-04-02 MED ORDER — KETOROLAC 30 MG/ML (1 ML) INJECTION SOLUTION
30.0000 mg | Freq: Once | INTRAMUSCULAR | Status: AC
Start: 2020-04-02 — End: 2020-04-02
  Administered 2020-04-02: 30 mg via INTRAVENOUS
  Filled 2020-04-02: qty 1

## 2020-04-02 MED ORDER — ROCURONIUM 10 MG/ML INTRAVENOUS SOLUTION
INTRAVENOUS | Status: AC
Start: 2020-04-02 — End: 2020-04-02
  Filled 2020-04-02: qty 5

## 2020-04-02 MED ORDER — GADOBUTROL 1 MMOL/ML (604.72 MG/ML) INTRAVENOUS SOLUTION
8.0000 mL | INTRAVENOUS | Status: AC
Start: 2020-04-02 — End: 2020-04-02
  Administered 2020-04-02: 8 mL via INTRAVENOUS

## 2020-04-02 MED ORDER — VANCOMYCIN 2 GRAM POWDER FOR CEMENT
1.0000 g | Freq: Once | TOPICAL | Status: AC
Start: 2020-04-02 — End: 2020-04-02
  Administered 2020-04-02: 1 g
  Filled 2020-04-02: qty 2

## 2020-04-02 MED ORDER — NALOXONE 4 MG/ACTUATION NASAL SPRAY
1.0000 | Freq: Once | NASAL | 0 refills | Status: AC
Start: 2020-04-02 — End: 2020-04-02

## 2020-04-02 MED ORDER — EPHEDRINE SULFATE 5 MG/ML INTRAVENOUS SOLUTION
INTRAVENOUS | Status: AC
Start: 2020-04-02 — End: 2020-04-02
  Filled 2020-04-02: qty 10

## 2020-04-02 MED ORDER — FENTANYL (PF) 50 MCG/ML INJECTION SOLUTION
INTRAMUSCULAR | Status: AC
Start: 2020-04-02 — End: 2020-04-02
  Filled 2020-04-02: qty 2

## 2020-04-02 MED ORDER — GLYCOPYRROLATE 0.2 MG/ML INJECTION SOLUTION
INTRAMUSCULAR | Status: AC
Start: 2020-04-02 — End: 2020-04-02
  Filled 2020-04-02: qty 1

## 2020-04-02 MED ORDER — FENTANYL (PF) 50 MCG/ML INJECTION SOLUTION
Freq: Once | INTRAMUSCULAR | Status: DC | PRN
Start: 2020-04-02 — End: 2020-04-02
  Administered 2020-04-02: 100 ug via INTRAVENOUS
  Administered 2020-04-02: 50 ug via INTRAVENOUS

## 2020-04-02 MED ORDER — SUGAMMADEX 100 MG/ML INTRAVENOUS SOLUTION
Freq: Once | INTRAVENOUS | Status: DC | PRN
Start: 2020-04-02 — End: 2020-04-02
  Administered 2020-04-02: 200 mg via INTRAVENOUS

## 2020-04-02 MED ORDER — SODIUM CHLORIDE 0.9 % INTRAVENOUS SOLUTION
INTRAVENOUS | Status: DC | PRN
Start: 2020-04-02 — End: 2020-04-02

## 2020-04-02 MED ORDER — MIDAZOLAM 1 MG/ML INJECTION SOLUTION
Freq: Once | INTRAMUSCULAR | Status: DC | PRN
Start: 2020-04-02 — End: 2020-04-02
  Administered 2020-04-02 (×2): 1 mg via INTRAVENOUS

## 2020-04-02 MED ORDER — SODIUM CHLORIDE 0.9 % IRRIGATION SOLUTION
3000.0000 mL | Status: DC | PRN
Start: 2020-04-02 — End: 2020-04-02
  Administered 2020-04-02: 3000 mL

## 2020-04-02 MED ORDER — ONDANSETRON HCL (PF) 4 MG/2 ML INJECTION SOLUTION
Freq: Once | INTRAMUSCULAR | Status: DC | PRN
Start: 2020-04-02 — End: 2020-04-02
  Administered 2020-04-02: 4 mg via INTRAVENOUS

## 2020-04-02 MED ORDER — DEXAMETHASONE SODIUM PHOSPHATE 4 MG/ML INJECTION SOLUTION
Freq: Once | INTRAMUSCULAR | Status: DC | PRN
Start: 2020-04-02 — End: 2020-04-02
  Administered 2020-04-02: 4 mg via INTRAVENOUS

## 2020-04-02 MED ORDER — MORPHINE 4 MG/ML INTRAVENOUS SOLUTION
3.0000 mg | INTRAVENOUS | Status: DC | PRN
Start: 2020-04-02 — End: 2020-04-04
  Administered 2020-04-03 (×6): 3 mg via INTRAVENOUS
  Filled 2020-04-02 (×7): qty 1

## 2020-04-02 MED ORDER — ROCURONIUM 10 MG/ML INTRAVENOUS SOLUTION
Freq: Once | INTRAVENOUS | Status: DC | PRN
Start: 2020-04-02 — End: 2020-04-02
  Administered 2020-04-02: 20 mg via INTRAVENOUS
  Administered 2020-04-02: 50 mg via INTRAVENOUS

## 2020-04-02 MED ORDER — PROPOFOL 10 MG/ML INTRAVENOUS EMULSION
INTRAVENOUS | Status: AC
Start: 2020-04-02 — End: 2020-04-02
  Filled 2020-04-02: qty 20

## 2020-04-02 MED ORDER — CEFAZOLIN 1 GRAM SOLUTION FOR INJECTION
Freq: Once | INTRAMUSCULAR | Status: DC | PRN
Start: 2020-04-02 — End: 2020-04-02
  Administered 2020-04-02: 2000 mg via INTRAVENOUS

## 2020-04-02 MED ORDER — SODIUM CHLORIDE 0.9 % (FLUSH) INJECTION SYRINGE
2.0000 mL | INJECTION | INTRAMUSCULAR | Status: DC | PRN
Start: 2020-04-02 — End: 2020-04-10

## 2020-04-02 MED ORDER — DIAZEPAM 5 MG/ML INJECTION SYRINGE
INJECTION | INTRAMUSCULAR | Status: AC
Start: 2020-04-02 — End: 2020-04-02
  Filled 2020-04-02: qty 2

## 2020-04-02 MED ORDER — PROPOFOL 10 MG/ML IV BOLUS
INJECTION | Freq: Once | INTRAVENOUS | Status: DC | PRN
Start: 2020-04-02 — End: 2020-04-02
  Administered 2020-04-02: 150 mg via INTRAVENOUS

## 2020-04-02 MED ORDER — DEXMEDETOMIDINE 4 MCG/ML IV DILUTION
Freq: Once | INTRAMUSCULAR | Status: DC | PRN
Start: 2020-04-02 — End: 2020-04-02
  Administered 2020-04-02: 8 ug via INTRAVENOUS

## 2020-04-02 MED ORDER — DIAZEPAM 5 MG/ML INJECTION SYRINGE
5.0000 mg | INJECTION | Freq: Once | INTRAMUSCULAR | Status: AC
Start: 2020-04-02 — End: 2020-04-02
  Administered 2020-04-02: 5 mg via INTRAVENOUS

## 2020-04-02 MED ORDER — NYSTATIN 100,000 UNIT/GRAM TOPICAL POWDER
Freq: Four times a day (QID) | CUTANEOUS | Status: DC
Start: 2020-04-02 — End: 2020-04-10
  Administered 2020-04-04 – 2020-04-10 (×19): 0 mL via TOPICAL
  Filled 2020-04-02: qty 15

## 2020-04-02 MED ORDER — HEPARIN (PORCINE) 5,000 UNIT/ML INJECTION SOLUTION
5000.0000 [IU] | Freq: Three times a day (TID) | INTRAMUSCULAR | Status: DC
Start: 2020-04-02 — End: 2020-04-10
  Administered 2020-04-02 – 2020-04-08 (×19): 5000 [IU] via SUBCUTANEOUS
  Administered 2020-04-09: 0 [IU] via SUBCUTANEOUS
  Administered 2020-04-09 – 2020-04-10 (×3): 5000 [IU] via SUBCUTANEOUS
  Administered 2020-04-10: 0 [IU] via SUBCUTANEOUS
  Filled 2020-04-02 (×24): qty 1

## 2020-04-02 MED ORDER — MORPHINE 4 MG/ML INTRAVENOUS SOLUTION
4.0000 mg | INTRAVENOUS | Status: AC
Start: 2020-04-02 — End: 2020-04-02
  Administered 2020-04-02: 4 mg via INTRAVENOUS

## 2020-04-02 MED ORDER — LIDOCAINE (PF) 20 MG/ML (2 %) INJECTION SOLUTION
INTRAMUSCULAR | Status: AC
Start: 2020-04-02 — End: 2020-04-02
  Filled 2020-04-02: qty 5

## 2020-04-02 MED ORDER — PHENYLEPHRINE 10 MG IN NS 100 ML INFUSION - FOR ANES
INTRAVENOUS | Status: DC | PRN
Start: 2020-04-02 — End: 2020-04-02
  Administered 2020-04-02: .1 ug/kg/min via INTRAVENOUS
  Administered 2020-04-02: .4 ug/kg/min via INTRAVENOUS
  Administered 2020-04-02: .2 ug/kg/min via INTRAVENOUS

## 2020-04-02 MED ORDER — MIDAZOLAM (PF) 1 MG/ML INJECTION SOLUTION
INTRAMUSCULAR | Status: AC
Start: 2020-04-02 — End: 2020-04-02
  Filled 2020-04-02: qty 2

## 2020-04-02 MED ORDER — SENNOSIDES 8.6 MG-DOCUSATE SODIUM 50 MG TABLET
2.0000 | ORAL_TABLET | Freq: Two times a day (BID) | ORAL | Status: DC
Start: 2020-04-02 — End: 2020-04-10
  Administered 2020-04-02 – 2020-04-03 (×3): 2 via ORAL
  Administered 2020-04-04 – 2020-04-07 (×7): 0 via ORAL
  Administered 2020-04-07 – 2020-04-09 (×4): 2 via ORAL
  Administered 2020-04-09 – 2020-04-10 (×2): 0 via ORAL
  Filled 2020-04-02 (×2): qty 1
  Filled 2020-04-02 (×6): qty 2

## 2020-04-02 MED ORDER — ENOXAPARIN 40 MG/0.4 ML SUBCUTANEOUS SYRINGE
40.0000 mg | INJECTION | SUBCUTANEOUS | Status: DC
Start: 2020-04-02 — End: 2020-04-02

## 2020-04-02 MED ORDER — LACTATED RINGERS INTRAVENOUS SOLUTION
INTRAVENOUS | Status: DC | PRN
Start: 2020-04-02 — End: 2020-04-02

## 2020-04-02 MED ORDER — HELP MEDICATION
480.0000 mg | Freq: Once | Status: DC
Start: 2020-04-02 — End: 2020-04-02

## 2020-04-02 SURGICAL SUPPLY — 30 items
ADHESIVE TISSUE EXOFIN 1.0ML_PREMIERPRO EXOFIN (SEALANTS)
BAG 28X36IN BAND EQP (DRAPE/PACKS/SHEETS/OR TOWEL) ×2 IMPLANT
BLANKET 64X25IN WARM NONST LF_HPOTHRM (ORTHOPEDICS (NOT IMPLANTS)) ×1
BLANKET MUL-T-BLNKT ADULT 64X25IN COLD CONN WARM NONST LF  REUSE (ORTHOPEDICS (NOT IMPLANTS)) ×2 IMPLANT
BRACE ORTHO AL FBRC SHLDR ABDUCT ROT CONTROL UNIV SLING WB 1 HAND BCKL 2 (ORTHOPEDICS (NOT IMPLANTS)) ×2 IMPLANT
BRACE SHOULDER ARC BLEDSOE_AE050400 (ORTHOPEDICS (NOT IMPLANTS)) ×1
BURR SURG 5MM PREC RND (CUTTING ELEMENTS) ×1
BURR SURG 5MM PREC RND STRL LF  DISP (SURGICAL CUTTING SUPPLIES) ×2 IMPLANT
CONV USE ITEM 156524 - ADHESIVE TISSUE EXOFIN 1.0ML_PREMIERPRO EXOFIN (SEALANTS) IMPLANT
CONV USE ITEM 337890 - PACK SURG BSIN 2 STRL LF  DISP (CUSTOM TRAYS & PACK) ×2 IMPLANT
CONV USE ITEM 338669 - PACK SURG CUSTOM TUMR STRL DISP LTX (CUSTOM TRAYS & PACK) ×2 IMPLANT
COVER WAND RFD STRL 50EA/CS_01-0020 (EQUIPMENT MINOR)
COVER WND RF DETECT STRL CLR EQP (EQUIPMENT MINOR) IMPLANT
DRAPE BND BAG 28X36IN SNAP KOV_ER EQP (DRAPE/PACKS/SHEETS/OR TOWEL) ×1
DRAPE CARM FLRSCP EXPD CLPSBL C-ARMOR STRL EQP (DRAPE/PACKS/SHEETS/OR TOWEL) IMPLANT
DRAPE CARM FLRSCP EXPD CLPSBL_C-ARMOR STRL EQP (DRAPE/PACKS/SHEETS/OR TOWEL)
DRESS 4X4IN ADH WTPRF ABS BRDR SIL STRL LF  OPTFM FOAM (WOUND CARE SUPPLY) IMPLANT
DRESS 4X4IN ADH WTPRF ABS BRDR_SIL STRL LF OPTFM FOAM (WOUND CARE/ENTEROSTOMAL SUPPLY)
FILLER BONE VOID 20CC 50CC CA SLF STIMULAN RPD CURE KIT PASTE BEAD ×2 IMPLANT
GARMENT COMPRESS LRG CALF CENTAURA NYL VASOGRAD LTWT BRTHBL SEQ FIL BLU 24- IN (ORTHOPEDICS (NOT IMPLANTS)) ×2 IMPLANT
GARMENT COMPRESS LRG CALF CENT_AURA NYL VASOGRAD LTWT BRTHBL (ORTHOPEDICS (NOT IMPLANTS)) ×1
GOWN SURG 2XL AAMI L3 REINF HK_LP CLSR SET IN SLEEVE STRL LF (PROTECTIVE PRODUCTS/GARMENTS) ×1
GOWN SURG 2XL L3 REINF HKLP CLSR SET IN SLEEVE STRL LF  DISP BLU SIRUS SMS 49IN (PROTECTIVE PRODUCTS/GARMENTS) ×2 IMPLANT
KIT DRESS GRANUFOAM SENSATRAC_VAC 26X15CM THK3.2CM LRG STD (WOUND CARE/ENTEROSTOMAL SUPPLY) ×1
KIT DRESS GRANUFOAM VAC SENSATRAC 26X15CM THK3.2CM LRG HDRPHB 2 DRP PAD CONN DISP RLR (WOUND CARE SUPPLY) ×2 IMPLANT
PACK BASIN DBL CUSTOM (CUSTOM TRAYS & PACK) ×2
PACK CUSTOM TUMOR_CS/1 (CUSTOM TRAYS & PACK) ×1
PACK SURG CSTM TUMR STRL DISP LTX (CUSTOM TRAYS & PACK) ×2
PACK SURG ECLIPSE MAYO MAJ ORT_HO III TBL CVR STAND STRL 90 (CUSTOM TRAYS & PACK) ×1
PACK SURG ECLIPSE ORTHO IV STRL 108X77IN 102X53IN PPR LF (CUSTOM TRAYS & PACK) ×2 IMPLANT

## 2020-04-02 NOTE — Nurses Notes (Signed)
Patient took off ARC brace completely while trying to eat lunch, helped patient to get brace back on, notified MD Sula Soda of Ortho, will continue to monitor, ortho to round on patient at some point today.

## 2020-04-02 NOTE — Anesthesia Preprocedure Evaluation (Signed)
ANESTHESIA PRE-OP EVALUATION  Planned Procedure: IRRIGATION AND DEBRIDEMENT SHOULDER (Right Knee)  Review of Systems     anesthesia history negative     patient summary reviewed  nursing notes reviewed        Pulmonary  negative pulmonary ROS,    Cardiovascular    No peripheral edema,  Exercise Tolerance: <4 METS        GI/Hepatic/Renal    GERD and well controlled     Endo/Other   neg endo/other ROS,        Neuro/Psych/MS    anxiety and depression    peripheral neuropathy,  Cancer  negative hematology/oncology ROS,                    Physical Assessment      Patient summary reviewed and Nursing notes reviewed   Airway       Mallampati: I    TM distance: >3 FB    Neck ROM: full  Mouth Opening: good.            Dental           (+) edentulous           Pulmonary    Breath sounds clear to auscultation  (-) no rhonchi, no decreased breath sounds, no wheezes, no rales and no stridor     Cardiovascular    Rhythm: regular  Rate: Normal  (-) no friction rub, carotid bruit is not present, no peripheral edema and no murmur     Other findings            Plan  ASA 3     Planned anesthesia type: general              Intravenous induction     Anesthesia issues/risks discussed are: Sore Throat.  Anesthetic plan and risks discussed with patient.      Use of blood products discussed with patient who consented to blood products.     Patient's NPO status is appropriate for Anesthesia.           Plan discussed with CRNA.

## 2020-04-02 NOTE — Consults (Signed)
[facility] Orthopa[facility]  Service: Ortho  Attending: Mendel Ryder  Progress Note  04/02/2020    Name: [name] [name]  DOB: 1961/01/27  MRN: [id number]    [place] Date:04/01/2020  Date: 04/02/2020    Recent Surgery: I&D R shoulder 7/25      SUBJECTIVE:  59 y.o. male resting in bed with pain controlled    OBJECTIVE:  Vitals: BP (!) 102/52    Pulse 77    Temp 36.7 C (98.1 F)    Resp 13    Ht 1.803 m (5' 10.98")    Wt 83.6 kg (184 lb 4.9 oz)    SpO2 99%    BMI 25.72 kg/m         NAD, resting comfortably in bed  Breathing non-labored  RUE:  SILT over R R/M/U distribution  Makes thumbs up, okay sign, and spreads fingers  Intact R radial pulse  Dressing: iWV holding suction      ASSESSMENT:  59 y.o. male s/p I&D R shoulder    PLAN:  Pain control per primary  iWV and HVx2- trend output- to be managed by ortho  Deep tissue and bone cultures taken  Needs 6wks IV abx  DVT ppx okay to start POD1  PT/OT: NWBRUE  in brace  Abx : broad spectrum per primary  Dispo: FU with Dr Mendel Ryder 2 weeks ordered. No further intervention planned by ortho    Sula Soda, MD  Orthopaedics Resident  Pager [phone]  Sula Soda, MD 04/02/2020, 10:55

## 2020-04-02 NOTE — Ancillary Notes (Signed)
[facility]  MRI Technologist Note        MRI has been completed.        Elvina Mattes, RT (R)(MR) 04/02/2020, 05:49

## 2020-04-02 NOTE — Nurses Notes (Signed)
Patient refused to go down for xray, paged and notified [employer] [facility] [place].

## 2020-04-02 NOTE — Consults (Signed)
[facility]  Date of Service: 04/02/2020  Encounter Start Date:  04/01/2020  Inpatient Admission Date:  04/01/2020    Service: Hospitalist   Requesting MD: Dr. Toy Care  Chief Complaint:  L Heel Ulcer    -Patient seen resting comfortably at bedside. Patient requesting to not be examined/evaluated by podiatry. Patient states he has a chronic recurring wound at the left foot, denies any open lesions at the RLE. Mepilex Border Heel dressing noted at b/l foot. I offered to return to examine/evaluate patient tomorrow, patient defers. Please page podiatry should patient be amenable to eval.     Gillie Manners, DPM  Podiatry, Asst Professor  [facility] Department of Orthopaedics  Pager: [phone]

## 2020-04-02 NOTE — Anesthesia Transfer of Care (Signed)
ANESTHESIA TRANSFER OF CARE   [name] [name] is a 59 y.o. ,male, Weight: 83.6 kg (184 lb 4.9 oz)   had Procedure(s) with comments:  IRRIGATION AND DEBRIDEMENT SHOULDER  APPLICATION WOUND VAC - incisional wound vac  performed  04/02/20   Primary Service: Annamary Rummage Carducc*    Past Medical History:   Diagnosis Date    Anxiety     Depression     Diabetes (CMS [place])     Diabetes mellitus, type 2 (CMS HCC)     GERD (gastroesophageal reflux disease)     MRSA (methicillin resistant staph aureus) culture positive 08/06/2019    L eye cellulitis    Osteomyelitis (CMS HCC)     Peripheral neuropathy       Allergy History as of 04/02/20      No Known Allergies              I completed my transfer of care / handoff to the receiving personnel during which we discussed:  Access, Airway, All key/critical aspects of case discussed, Analgesia, Antibiotics, Expectation of post procedure, Fluids/Product, Gave opportunity for questions and acknowledgement of understanding, Labs and PMHx    Post Location: PACU                                          Additional Info:Pt transported to PACU with supplemental oxygen 6L via simple face mask.  Report given to RN; all questions answered.  VSS, airway patent.                        Last OR Temp: Temperature: 36.7 C (98.1 F)  ABG:  PCO2 (VENOUS)   Date Value Ref Range Status   04/01/2020 44.00 41.00 - 51.00 mm/Hg Final     PO2 (VENOUS)   Date Value Ref Range Status   04/01/2020 37.0 35.0 - 50.0 mm/Hg Final     SODIUM   Date Value Ref Range Status   04/01/2020 141 mmol/L Final     POTASSIUM   Date Value Ref Range Status   04/02/2020 4.6 3.5 - 5.1 mmol/L Final     KETONES   Date Value Ref Range Status   04/01/2020 Negative Negative mg/dL Final     WHOLE BLOOD POTASSIUM   Date Value Ref Range Status   04/01/2020 4.0 3.5 - 4.6 mmol/L Final     CHLORIDE   Date Value Ref Range Status   04/01/2020 110 101 - 111 mmol/L Final     CALCIUM   Date Value Ref Range Status   04/02/2020 8.7 (L) 8.8 -  10.2 mg/dL Final     Calculated P Axis   Date Value Ref Range Status   08/11/2019 35 degrees Final     Calculated R Axis   Date Value Ref Range Status   04/01/2020 -16 degrees Final     Calculated T Axis   Date Value Ref Range Status   04/01/2020 19 degrees Final     IONIZED CALCIUM   Date Value Ref Range Status   04/01/2020 1.24 mmol/L Final     LACTATE   Date Value Ref Range Status   04/01/2020 0.9 0.0 - 1.3 mmol/L Final     HEMOGLOBIN   Date Value Ref Range Status   04/01/2020 6.1 (LL) 12.0 - 18.0 g/dL Final     OXYHEMOGLOBIN   Date  Value Ref Range Status   04/01/2020 66.3 % Final     CARBOXYHEMOGLOBIN   Date Value Ref Range Status   04/01/2020 1.2 0.0 - 2.5 % Final     MET-HEMOGLOBIN   Date Value Ref Range Status   04/01/2020 0.0 % Final     Comment:     Value outside the measurable range.     BASE DEFICIT   Date Value Ref Range Status   04/01/2020 2.5 -3.0 - 3.0 mmol/L Final     BICARBONATE (VENOUS)   Date Value Ref Range Status   04/01/2020 22.6 22.0 - 26.0 mmol/L Final     %FIO2 (VENOUS)   Date Value Ref Range Status   04/01/2020 99.0 % Final     Airway:* No LDAs found *  Blood pressure (!) 102/52, pulse 77, temperature 36.7 C (98.1 F), resp. rate 13, height 1.803 m (5' 10.98"), weight 83.6 kg (184 lb 4.9 oz), SpO2 99 %.

## 2020-04-02 NOTE — Brief Op Note (Signed)
[facility]  Dept of Orthopaedic Surgery                          BRIEF OPERATIVE NOTE    Patient Name: [name] [name], [name] [name] Number: [id number]  Date of Service: 04/02/2020   Date of Birth: 1961/04/13    Pre-Operative Diagnosis: R shoulder infection   Post-Operative Diagnosis: Same  Procedure(s)/Description:  I&D R shoulder/periscauplar muscles. I/D R proximal humerus with antibiotic bead placement  Findings: see op report     Attending Surgeon: Mendel Ryder  Assistant(s): Neumann/Garry Nicolini    Anesthesia Type: General  Estimated Blood Loss:  100 ml  Blood Given: none  Fluids Given: per anesthesia  Complications (unintended/unexpected/iatrogenic/accidental/inadvertent events):  none  Characteristic Event (routinely expected or inherent to the difficulty/nature of the procedure): See Dictated Op Report  Did the use of current and/or prior Anticoagulants impact the outcome of the case? no  Wound Class: Dirty or Infected Wounds - Include old traumatic wounds    Tubes: None  Drains: incisional [place] and HVx2  Specimens/ Cultures: multiple taken  Implants: none           Disposition: PACU - hemodynamically stable.  Condition: stable    --  Sula Soda, MD  Orthopaedics Resident  Pager [phone]  Sula Soda, MD 04/02/2020, 10:53

## 2020-04-02 NOTE — Care Plan (Signed)
Occupational Therapy Contact Note    Order received and chart reviewed. Per chart review, patient to OR today for R shoulder washout with possible antibiotic spacer. OT will follow up with patient post-op, as appropriate and as schedule permits.    [place], OTR/L  Pager #: [phone]

## 2020-04-02 NOTE — Consults (Signed)
Brief Ortho Note    Attempted to round on patient this am, down at MRI. Our team has saw him prior.  Plan for OR today for I&D, keep NPO. Imperative that MRIs are completed prior to OR. Also needs H&H or CBC redrawn after given 2Us of PRBCs. Page ortho on call with any questions.     --  Rosita Fire, MD  Resident, PGY-5  Department of Orthopaedics  Pager [phone]

## 2020-04-02 NOTE — Progress Notes (Addendum)
[facility]  General Medicine  Progress note    Date of Service:  04/02/2020  Karlene Lineman, 59 y.o. male  Date of Admission:  04/01/2020  Date of Birth:  [birth year]  PCP: [facility]    Subjective:  Patient can barely open his eyes after surgery.  He denies any shortness of breath or chest pain currently.    Objective:  I reviewed all vital signs.  Examination:    Temperature: 36.5 C (97.7 F) Heart Rate: 76 BP (Non-Invasive): 129/79   Respiratory Rate: 14 SpO2: 100 %     Constitutional: appears chronically ill, pale, no distress and vital signs reviewed and discussed with patient  Eyes: Conjunctiva clear., Pupils equal and round, reactive to light and accomodation. , Sclera non-icteric.   ENT: ENMT without erythema or injection, mucous membranes moist.  Neck: no thyromegaly or lymphadenopathy and thyroid: not enlarged, symmetric, no tenderness/mass/nodules  Respiratory: Clear to auscultation and percussion bilaterally. , Thoracic excursion  Normal  Cardiovascular: regular rate and rhythm, S1, S2 normal, no murmur, click, rub or gallop     Pulses 2/4, trace pedal edema  Gastrointestinal: Soft, non-tender, Bowel sounds normal, non-distended  Genitourinary: Deferred  Musculoskeletal: Multiple drains coming from right shoulder.  Multiple staples from previous surgeries.  Integumentary:  Skin warm and dry and No rashes  Neurologic: Grossly normal, CN II - XII grossly intact , Alert and oriented x3, No tremor, No asterixis  Lymphatic/Immunologic/Hematologic: No lymphadenopathy  Psychiatric: Affect Normal  Glasgow: Total score only: 15  Coma (GCS Coma less than 8): Coma -Not applicable    Labs: I have reviewed all lab results.  Lab Results Today:    Results for orders placed or performed during the hospital encounter of 04/01/20 (from the past 24 hour(s))   ECG 12-LEAD   Result Value Ref Range    Ventricular rate 84 BPM    Atrial Rate 84 BPM    PR Interval 158 ms    QRS Duration 92 ms    QT Interval  406 ms    QTC Calculation 479 ms    Calculated P Axis       Calculated R Axis -16 degrees    Calculated T Axis 19 degrees   VENOUS BLOOD GAS, CO-OX, LYTES, LACTATE REFLEX   Result Value Ref Range    %FIO2 (VENOUS) 99.0 %    PH (VENOUS) 7.33 7.31 - 7.41    PCO2 (VENOUS) 44.00 41.00 - 51.00 mm/Hg    PO2 (VENOUS) 37.0 35.0 - 50.0 mm/Hg    BASE DEFICIT 2.5 -3.0 - 3.0 mmol/L    BICARBONATE (VENOUS) 22.6 22.0 - 26.0 mmol/L    LACTATE 0.9 0.0 - 1.3 mmol/L    SODIUM 141 mmol/L    WHOLE BLOOD POTASSIUM 4.0 3.5 - 4.6 mmol/L    CHLORIDE 110 101 - 111 mmol/L    IONIZED CALCIUM 1.24 mmol/L    GLUCOSE 149 (H) 60 - 105 mg/dL    HEMOGLOBIN 6.1 (LL) 12.0 - 18.0 g/dL    OXYHEMOGLOBIN 66.3 %    CARBOXYHEMOGLOBIN 1.2 0.0 - 2.5 %    MET-HEMOGLOBIN 0.0 %    O2CT 5.7 %   MAGNESIUM   Result Value Ref Range    MAGNESIUM 1.7 (L) 1.8 - 2.6 mg/dL   PHOSPHORUS   Result Value Ref Range    PHOSPHORUS 4.5 (H) 2.3 - 4.0 mg/dL   HEPATIC FUNCTION PANEL   Result Value Ref Range    ALBUMIN 1.8 (L) 3.4 -  4.8 g/dL     ALKALINE PHOSPHATASE 109 45 - 115 U/L    ALT (SGPT) 6 (L) 10 - 55 U/L    AST (SGOT)  13 8 - 45 U/L    BILIRUBIN TOTAL 0.2 (L) 0.3 - 1.3 mg/dL    BILIRUBIN DIRECT 0.1 0.1 - 0.4 mg/dL    PROTEIN TOTAL 6.9 6.0 - 8.0 g/dL   LIPASE   Result Value Ref Range    LIPASE 28 10 - 60 U/L   GAMMA GT   Result Value Ref Range    GGT 52 (H) 7 - 50 U/L   B-TYPE NATRIURETIC PEPTIDE   Result Value Ref Range    BNP 116 (H) <=100 pg/mL   OSMOLALITY   Result Value Ref Range    OSMOLALITY, BLOOD 304 (H) 280 - 300 mOsm/kg   PT/INR   Result Value Ref Range    PROTHROMBIN TIME 11.4 9.1 - 13.9 seconds    INR 0.99 0.80 - 1.20   PTT (PARTIAL THROMBOPLASTIN TIME)   Result Value Ref Range    APTT 34.4 24.2 - 37.5 seconds   BASIC METABOLIC PANEL   Result Value Ref Range    SODIUM 141 136 - 145 mmol/L    POTASSIUM 4.1 3.5 - 5.1 mmol/L    CHLORIDE 111 96 - 111 mmol/L    CO2 TOTAL 23 23 - 31 mmol/L    ANION GAP 7 4 - 13 mmol/L    CALCIUM 8.9 8.8 - 10.2 mg/dL    GLUCOSE 151  (H) 65 - 125 mg/dL    BUN 13 8 - 25 mg/dL    CREATININE 1.36 (H) 0.75 - 1.35 mg/dL    BUN/CREA RATIO 10 6 - 22    ESTIMATED GFR 56 (L) >=60 mL/min/BSA   LIPID PANEL   Result Value Ref Range    CHOLESTEROL  131 100 - 200 mg/dL    HDL CHOL 47 (L) >=50 mg/dL    TRIGLYCERIDES 167 (H) <150 mg/dL    LDL CALC 51 <100 mg/dL    VLDL CALC 33 (H) <30 mg/dL    NON-HDL 84 <=190 mg/dL    CHOL/HDL RATIO 2.8    PROCALCITONIN ALGORITHM   Result Value Ref Range    PROCALCITONIN  0.22 <0.50 ng/mL   THYROID STIMULATING HORMONE WITH FREE T4 REFLEX   Result Value Ref Range    TSH 2.030 0.430 - 3.550 uIU/mL   CREATINE KINASE (CK), TOTAL, SERUM   Result Value Ref Range    CREATINE KINASE 26 (L) 45 - 225 U/L   TROPONIN-I   Result Value Ref Range    TROPONIN I 12 0 - 30 ng/L   URIC ACID   Result Value Ref Range    URIC ACID 6.6 3.5 - 7.5 mg/dL   HIV1/HIV2 SCREEN, COMBINED ANTIGEN AND ANTIBODY   Result Value Ref Range    HIV SCREEN, COMBINED ANTIGEN & ANTIBODY Negative Negative   HEPATITIS B SURFACE ANTIBODY   Result Value Ref Range    HBV SURFACE ANTIBODY QUANTITATIVE 1 <8 mIU/mL    HEPATITIS B SURFACE ANTIGEN   Result Value Ref Range    HBV SURFACE ANTIGEN QUALITATIVE Negative Negative   HEPATITIS B CORE ANTIBODY   Result Value Ref Range    HBV CORE TOTAL ANTIBODIES Negative Negative   VANCOMYCIN, RANDOM   Result Value Ref Range    VANCOMYCIN RANDOM 11.9 (L) 30.0 - 40.0 ug/mL   C-REACTIVE PROTEIN(CRP),INFLAMMATION   Result Value Ref Range  CRP INFLAMMATION 47.5 (H) <8.0 mg/L   SEDIMENTATION RATE   Result Value Ref Range    ERYTHROCYTE SEDIMENTATION RATE (ESR) 124 (H) 0 - 15 mm/hr   CBC WITH DIFF   Result Value Ref Range    WBC 11.3 (H) 3.7 - 11.0 x103/uL    RBC 2.16 (L) 4.50 - 6.10 x106/uL    HGB 6.3 (LL) 13.4 - 17.5 g/dL    HCT 19.9 (L) 38.9 - 52.0 %    MCV 92.1 78.0 - 100.0 fL    MCH 29.2 26.0 - 32.0 pg    MCHC 31.7 31.0 - 35.5 g/dL    RDW-CV 15.0 11.5 - 15.5 %    PLATELETS 401 (H) 150 - 400 x103/uL    MPV 9.5 8.7 - 12.5 fL   MANUAL  DIFF AND MORPHOLOGY-SYSMEX   Result Value Ref Range    NEUTROPHIL % 74 %    LYMPHOCYTE %  18 %    MONOCYTE % 2 %    EOSINOPHIL % 3 %    BASOPHIL % 1 %    METAMYELOCYTE %  2 %    NEUTROPHIL # 8.36 (H) 1.50 - 7.70 x103/uL    LYMPHOCYTE # 2.03 1.00 - 4.80 x103/uL    MONOCYTE # 0.23 0.20 - 1.10 x103/uL    EOSINOPHIL # 0.34 <=0.50 x103/uL    BASOPHIL # 0.11 <=0.20 x103/uL    NRBC FROM MANUAL DIFF 0 per 100 WBC    HYPOCHROMASIA 2+/Moderate (A) None   ABO & RH   Result Value Ref Range    ABO/RH(D) O POSITIVE    COVID-19 SCREENING - Inpatient Preop - NON-PUI   Result Value Ref Range    SARS-CoV-2 Negative Negative   TYPE AND SCREEN   Result Value Ref Range    UNITS ORDERED 4     ABO/RH(D) O POSITIVE     ANTIBODY SCREEN NEGATIVE     SPECIMEN EXPIRATION DATE 04/04/2020    CROSSMATCH RED CELLS - UNITS , 4 Units   Result Value Ref Range    Coding System [id number]     UNIT NUMBER [id number]     BLOOD COMPONENT TYPE LRCPD, 04350     UNIT DIVISION 00     UNIT DISPENSE STATUS ISSUED,FINAL     TRANSFUSION STATUS OK TO TRANSFUSE     IS CROSSMATCH Electronically Compatible     Product Code E0181V00     Coding System ISBT128     UNIT NUMBER [id number]     BLOOD COMPONENT TYPE LRCPD, 04350     UNIT DIVISION 00     UNIT DISPENSE STATUS ISSUED,FINAL     TRANSFUSION STATUS OK TO TRANSFUSE     IS CROSSMATCH Electronically Compatible     Product Code E0181V00     Coding System ISBT128     UNIT NUMBER [id number]     BLOOD COMPONENT TYPE LR RBC, Adsol1, 04710     UNIT DIVISION 00     UNIT DISPENSE STATUS ALLOCATED     TRANSFUSION STATUS OK TO TRANSFUSE     IS CROSSMATCH Electronically Compatible     Product Code M7544B20     Coding System ISBT128     UNIT NUMBER [id number]     BLOOD COMPONENT TYPE LR RBC, Adsol1, 04710     UNIT DIVISION 00     UNIT DISPENSE STATUS ALLOCATED     TRANSFUSION STATUS OK TO TRANSFUSE     IS CROSSMATCH Electronically Compatible  Product Code [phone]    DRUG SCREEN, WITH CONFIRMATION, URINE      Result Value Ref Range    AMPHETAMINES, URINE Negative Negative    BARBITURATES URINE Negative Negative    BENZODIAZEPINES URINE Negative Negative    BUPRENORPHINE URINE Negative Negative    CANNABINOIDS URINE Negative Negative    COCAINE METABOLITES URINE Negative Negative    METHADONE URINE Negative Negative    OPIATES URINE (LOW CUTOFF) Positive (A) Negative    OXYCODONE URINE Positive (A) Negative    ECSTASY/MDMA URINE Negative Negative    FENTANYL, RANDOM URINE Negative Negative    CREATININE RANDOM URINE 50 50 - 100 mg/dL   URINALYSIS, MACROSCOPIC   Result Value Ref Range    SPECIFIC GRAVITY 1.013 1.005 - 1.030    GLUCOSE Negative Negative mg/dL    PROTEIN 100  (A) Negative mg/dL    BILIRUBIN Negative Negative mg/dL    UROBILINOGEN Negative Negative mg/dL    PH 6.0 5.0 - 8.0    BLOOD Negative Negative mg/dL    KETONES Negative Negative mg/dL    NITRITE Negative Negative    LEUKOCYTES Negative Negative WBCs/uL    APPEARANCE Clear Clear    COLOR Normal (Yellow) Normal (Yellow)   URINALYSIS, MICROSCOPIC   Result Value Ref Range    WBCS 1.0 <4.0 /hpf    RBCS <1.0 <6.0 /hpf    BACTERIA Occasional or less Occasional or less /hpf    HYALINE CASTS 1.0 <4.0 /lpf    MUCOUS Light Light /lpf   POC BLOOD GLUCOSE (RESULTS)   Result Value Ref Range    GLUCOSE, POC 117 (H) 70 - 105 mg/dl   POC BLOOD GLUCOSE (RESULTS)   Result Value Ref Range    GLUCOSE, POC 190 (H) 70 - 105 mg/dl   POC BLOOD GLUCOSE (RESULTS)   Result Value Ref Range    GLUCOSE, POC 101 70 - 105 mg/dl   PROCALCITONIN REFLEX 6HR   Result Value Ref Range    PROCALCITONIN  0.18 <0.50 ng/mL   BASIC METABOLIC PANEL, FASTING   Result Value Ref Range    SODIUM 140 136 - 145 mmol/L    POTASSIUM 4.6 3.5 - 5.1 mmol/L    CHLORIDE 111 96 - 111 mmol/L    CO2 TOTAL 23 23 - 31 mmol/L    ANION GAP 6 4 - 13 mmol/L    CALCIUM 8.7 (L) 8.8 - 10.2 mg/dL    GLUCOSE 93 70 - 99 mg/dL    BUN 14 8 - 25 mg/dL    CREATININE 1.36 (H) 0.75 - 1.35 mg/dL    BUN/CREA RATIO 10 6 - 22     ESTIMATED GFR 56 (L) >=60 mL/min/BSA   MAGNESIUM   Result Value Ref Range    MAGNESIUM 1.7 (L) 1.8 - 2.6 mg/dL   C-REACTIVE PROTEIN(CRP),INFLAMMATION   Result Value Ref Range    CRP INFLAMMATION 54.7 (H) <8.0 mg/L   CBC WITH DIFF   Result Value Ref Range    WBC 11.1 (H) 3.7 - 11.0 x103/uL    RBC 3.36 (L) 4.50 - 6.10 x106/uL    HGB 9.8 (L) 13.4 - 17.5 g/dL    HCT 30.4 (L) 38.9 - 52.0 %    MCV 90.5 78.0 - 100.0 fL    MCH 29.2 26.0 - 32.0 pg    MCHC 32.2 31.0 - 35.5 g/dL    RDW-CV 15.0 11.5 - 15.5 %    PLATELETS 331 150 - 400 x103/uL  MPV 9.3 8.7 - 12.5 fL    NEUTROPHIL % 75 %    LYMPHOCYTE % 12 %    MONOCYTE % 7 %    EOSINOPHIL % 2 %    BASOPHIL % 1 %    NEUTROPHIL # 8.33 (H) 1.50 - 7.70 x103/uL    LYMPHOCYTE # 1.36 1.00 - 4.80 x103/uL    MONOCYTE # 0.74 0.20 - 1.10 x103/uL    EOSINOPHIL # 0.26 <=0.50 x103/uL    BASOPHIL # 0.11 <=0.20 x103/uL    IMMATURE GRANULOCYTE % 3 (H) 0 - 1 %    IMMATURE GRANULOCYTE # 0.30 (H) <0.10 x103/uL   POC BLOOD GLUCOSE (RESULTS)   Result Value Ref Range    GLUCOSE, POC 88 70 - 105 mg/dl   SURGICAL PATHOLOGY SPECIMEN   Result Value Ref Range    Intraoperative Consult Diagnosis       A. Bone.  A. SCAPULA, RIGHT, EXCISION:   - Benign.   - Abundant acute inflammation, necrosis, and fibrosis.  - Reviewed with Dr. Mendel Ryder on the microscope.         OR CULTURE(AEROBIC AND GRAM STAIN)    Specimen: Tissue   Result Value Ref Range    GRAM STAIN 3+ Several PMNs     GRAM STAIN No Organisms Seen    OR CULTURE(AEROBIC AND GRAM STAIN)    Specimen: Tissue   Result Value Ref Range    GRAM STAIN 4+ Many PMNs     GRAM STAIN No Organisms Seen    OR CULTURE(AEROBIC AND GRAM STAIN)    Specimen: Tissue   Result Value Ref Range    GRAM STAIN 3+ Several PMNs     GRAM STAIN No Organisms Seen    OR CULTURE(AEROBIC AND GRAM STAIN)    Specimen: Tissue   Result Value Ref Range    GRAM STAIN 3+ Several PMNs     GRAM STAIN No Organisms Seen    OR CULTURE(AEROBIC AND GRAM STAIN)    Specimen: Tissue   Result  Value Ref Range    GRAM STAIN 3+ Several PMNs     GRAM STAIN No Organisms Seen    OR CULTURE(AEROBIC AND GRAM STAIN)    Specimen: Tissue   Result Value Ref Range    GRAM STAIN 3+ Several PMNs     GRAM STAIN No Organisms Seen    POC BLOOD GLUCOSE (RESULTS)   Result Value Ref Range    GLUCOSE, POC 144 (H) 70 - 105 mg/dl     DNR Status:  Full Code    Assessment/Plan:   Active Hospital Problems    Diagnosis    Primary Problem: Abscess of right shoulder     Blandon Offerdahl is a 59 y.o., white male with a history of anxiety, depression, diabetes with peripheral neuropathy, GERD, and diabetic foot wounds admitted as a direct admission from [facility] for 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    Positive blood culture (unknown if contaminant due to error)  -line sepsis protocol was ordered yesterday; however, micro received the sample without any labeling regarding where the sample was taken from.  Therefore, I have no idea whether this is a skin source or from the PICC line.  Instructed the nurse to, again. repeat blood cultures.  Two bottles through the skin, and 2 bottles through the PICC line stat.     Right shoulder abscess / Streptococcus sepsis / humeral  head osteomyelitis  -patient underwent multiple surgeries at [facility] and was transferred here for further debridement.  -orthopedics performed surgery hete on 04/02/20.  Currently has JP drains in.    -on Dapto, cefepime, and Flagyl.  Current end date 05/14/20. OPAT ordered. Has PICC.   -continue Oxycodone 5 or 10 for pain.  Morphine for breakthrough.  Discontinue morphine 04/03/20.  -repeat blood cultures here including line sepsis protocol.  -stop drinking.    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  Advanced wound and Podiatry consulted.  -patient has known osteo on the left heel and was  originally on vanc, cefepime, and Flagyl with the end date of 09/13.  -patient was switched to daptomycin, cefepime, and Flagyl with a new end date of 05/14/20.   -stop drinking.    Thrombocytosis (RESOLVED)  -reactive and now resolving after surgery.    Anemia of chronic disease  -transfuse 2 units of blood on 04/01/20  -stop drinking.    Severe protein calorie malnutrition  -albumin very low likely from chronic inflammatory state.  -nutrition consulted.    CKD stage III  -continue to monitor.  Avoid any nephrotoxic medication.  -stop drinking.    Anxiety / Depression  -continue Bupropion and Vistaril.  -stop drinking.    Diabetes with peripheral neuropathy  -continue Lantus 25 units nightly.  -SSIP.  -continue Neurontin but at reduced dosage due to CKD stage 3.   -stop drinking.      Alcohol use disorder, recurrent severe  -drinks 3-4 beers daily now.  Denies any history of delirium tremens or withdrawal seizures.  -patient admits to drinking hard liquor and recently cutting down.  -patient has 2 alcohol-related arrests but does not consider his drinking a problem.  -continue thiamine.  -stop drinking.    Physical deconditioning  -PT OT pending.  -stop drinking.    GERD  -continue famotidine.  -stop drinking.    DVT/PE Prophylaxis: Heparin    Disposition:  PT OT pending.    Randie Heinz, DO  Assistant Professor   Department of Internal Medicine

## 2020-04-02 NOTE — Anesthesia Postprocedure Evaluation (Signed)
Anesthesia Post Op Evaluation    Patient: [name] [name]  Procedure(s) with comments:  IRRIGATION AND DEBRIDEMENT SHOULDER  APPLICATION WOUND VAC - incisional wound vac    Last Vitals:Temperature: 36.4 C (97.5 F) (04/02/20 1045)  Heart Rate: 77 (04/02/20 1130)  BP (Non-Invasive): (!) 126/50 (04/02/20 1130)  Respiratory Rate: 16 (04/02/20 1130)  SpO2: 100 % (04/02/20 9037)    No complications documented.    Patient is sufficiently recovered from the effects of anesthesia to participate in the evaluation and has returned to their pre-procedure level.  Patient location during evaluation: bedside       Patient participation: complete - patient participated  Level of consciousness: awake    Pain management: adequate  Airway patency: patent    Anesthetic complications: no  Cardiovascular status: stable  Respiratory status: room air  Hydration status: stable  Patient post-procedure temperature: Pt Normothermic   PONV Status: Absent

## 2020-04-02 NOTE — OR Surgeon (Signed)
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    NAME OF PROCEDURE:  Right shoulder chronic infection and scapular abscess    SURGEON:  Enis Slipper, MD.    ASSISTANTS:  Shelly Rubenstein, MD, and Hoover Brunette, MD.    ANESTHESIA:  General.    ESTIMATED BLOOD LOSS:  100.    FLUIDS:  Per anesthesia.    CULTURES:  Sent.    SPECIMEN:  Sent for frozen which was negative for malignancy and definitive pathology.    DRAINS:  Two medium Hemovac drains were placed and an incisional Wound VAC was placed.    CONDITION:  Stable.    INDICATIONS FOR PROCEDURE:  [name] [name] is a 59 year old gentleman who has had multiple issues with his right proximal humerus.  He had an attempted open debridement of his right shoulder as well as percutaneous drainage of his periscapular abscesses at an outside facility, had multiple drains placed, and was sent here for definitive treatment.  His repeat MRI showed severe abscess formation in his periscapular region as well as progressive osteomyelitic changes within his proximal humerus.  After a very lengthy conversation with him about the operative versus nonoperative management of his issue and the severe severity of it, which can up to and include loss of his extremity, he wished to undergo operative intervention for this problem.    DESCRIPTION OF PROCEDURE:  The patient was brought to the operating room where his right upper extremity was identified.  The patient was placed under general anesthesia by the anesthesia team and was given IV antibiotics perioperatively.  At that point in time, he was placed in a left lateral decubitus position with an axillary roll in place. All his bony prominences were well padded with a Venodyne on the operative leg.   His right upper extremity was cleaned and prepped with ChloraPrep and draped in a sterile fashion.  An incision was made to connect 2 of the posterior scapular incisions to go down to the periscapular lesions.  We opened that up, split through the trapezius as well as the infraspinatus, and then got into 1 of the fluid collections, and a substantial amount of pus rolled out.  We took debridement of that area, stepped out of the operating room, and reviewed it with Dr. Harl Favor, who felt like it was pure infection.  We scrubbed back in and extended the incision proximally and distally all the way up around to his deltoid, almost in the style of a total scapulectomy incision.  We released the inferior part of his rhomboids off the medial scapula and got into the subscapular place.  Again, a substantial amount of infection and abscess were removed.  We then used a curette to debride that back, took multiple cultures from this area, copiously irrigated with pulse lavage, placed 1 medium Hemovac into that cavity, and closed the rhomboids back up with 0 Vicryl pop-off.  I then copiously irrigated out and debrided the abscess within the infraspinatus and then placed a medium Hemovac in that area as well.  I went in through and ellipsed out the old drain sites as well and then connected all the way over to the shoulder.  Once we opened the anterior deltopectoral interval, we ellipsed out the  dead necrotic tissue within his skin and there was a massive amount of pus that had rolled out of his subdeltoid and what appeared to be an intra-articular version since his subscapularis tendon was completely gone, whether it be from debridement or from infection, and he had an intra-articular exposure, so we irrigated out his shoulder joint copiously as well as the subdeltoid region.  We sent off multiple cultures.  We then put a dime-sized hole within the anterosuperior part of the proximal humerus just below the articular surface,  debrided up into the humeral head as well as down into the shaft, and took multiple cultures as well.  We then copiously irrigated out with sterile saline within the humeral head and then packed it with Stimulan beads with vancomycin and gentamycin.  We copiously irrigated out with sterile saline and closed with 0 Vicryl pop-off, 2-0 subcuticular, and staples for the skin with placement of an incisional Wound VAC.     Please add a -22 modifier, adding 40% increase effort for the case secondary to the massive extent of his periscapular as well as proximal humerus and osteomyelitic changes for an excisional debridement of all this infection, repair of the incisions, and placement of drains and Wound VAC.        Enis Slipper, MD  Assistant Professor   [facility] [facility] Department of Orthopaedics              DD:  04/02/2020 12:12:54  DT:  04/02/2020 13:11:33 KF  D#:  [id number]

## 2020-04-02 NOTE — Pharmacy (Signed)
[place] / Department of Pharmaceutical Services  Therapeutic Drug Monitoring: Vancomycin  04/02/2020      Patient name: [name] [name], [name] [name]  Date of Birth:  Jan 12, 1961    Actual Weight:  Weight: 83.6 kg (184 lb 4.9 oz) (04/02/20 0722)     BMI:  BMI (Calculated): 25.77 (04/02/20 0722)    Date RPh Current regimen (including mg/kg) Indication AUC or trough based dosing Target Levels^ SCr (mg/dL) CrCl* (mL/min) Measured level(s)   (mcg/mL) Calculated AUC (if AUC based monitoring) Plan (including when levels are due) Comments   7/24 MLW N/a received at [facility] - - - - Random: 11.9 - Switched to daptomycin per MD    07/25 akw Vanco 1000 mg powder for cement in OR                                                                                    ^Goal Target depends on dosing and monitoring method, AUC vs. trough based. For AUC based dosing units are mg*h/L. For trough based dosing units are mcg/mL.     *Creatinine clearance is estimated by using the Cockcroft-Gault equation for adult patients and the Carol Ada for pediatric patients.    The decision to discontinue vancomycin therapy will be determined by the primary service.  Please contact the pharmacist with any questions regarding this patient's medication regimen.

## 2020-04-02 NOTE — Consults (Signed)
[name]  Infectious Disease Initial Consult      [name] [name], [name] [name], 59 y.o. male  Encounter Start Date:  04/01/2020  Inpatient Admission Date: 04/01/2020   Date of service: 04/02/2020  Date of Birth:  10/01/1960    Hospital Day:  LOS: 1 day     Service: Hospitalist 22  Requesting MD: Dr. 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 Per chart review, he was initially admitted to [place] for right shoulder pain following a fall. At presentation, he was also noted to be in DKA and was treated. He was found to be bacteremic with group B streptococcus. Given the presence of the chronic DFU, he was planned for treatment with 6 weeks of IV antibiotic therapy to end in mid September of this year. However, continued shoulder pain prompted MRI evaluation which revealed the shoulder abscess and possible osteomyelitis.     He was admitted to [facility] on 07/24 and has been taken to the OR by orthopedics the morning of the 25th of July for I&D and antibiotic bead placement.      Of note, this patient was admitted to [facility] previously in 08/2019 at which time he was admitted for concerns of preseptal cellulitis and abscess of the left eye. At that point, the patient had finished another 6 week IV antibiotic therapy course 6 months prior for DFU associated osteomyelitis of the foot. At this point in time, there were cultures taken of purulent drainage (assuming eye, but unspecified in the result) which was positive for MRSA.     As far as the current problem, his last A1c on record is 8.8 in 08/2019. He had the group B streptococcus  sepsis at the outside. Repeat blood cultures at our facility are still yet pending. Group B streptococcus is well known to cause joint infection in diabetics. His most likely source is related to the ongoing foot wounds and infection. However, given that this infection is associated with the patient's diabetes it is most likely that the infection is polymicrobial in nature. Therefore, at this point in time, Infectious Disease recommends:    - Continuing Daptomycin '6mg'$ /kg Q24 hours,  Cefepime 2g Q12hr, and Flagyl '500mg'$  TID. He will likely require a prolonged course of antibiotics (ideally 6 weeks) starting 04/02/2020 (the operative date)  - Continue to await results of blood cultures and OR cultures. Infectious disease will continue to follow along as well.   - Recommend routine monitoring of CBC, BMP, AST/ALT, Bilirubin, CK while on this antibiotic therapy.     Information Obtained from: patient and history reviewed via medical record  Reason for Consult:  Right Shoulder Abscess and associated osteomyelitis.     HPI/Discussion:  [name] [name] is a 59 y.o., White male who is admitted to [facility] from [facility] for definitive management of right shoulder abscess and humeral head osteomyelitis. [name] [name] has been dealing with diabetic foot wounds for several years now, he reports that he has not required prolonged courses of antibiotics in the past. He reports that he had been in [place] for 25 days prior to being  transferred up here. It is noted in the record that he was treated for DKA at the outside hospital and was also started on broad spectrum antibiotics on 03/08/2020 for this osteomyelitis of the right foot. He reportedly initially presented with right shoulder pain suffered after a fall and was ultimately found to have the shoulder abscess and humeral head infection.     Initially, when I saw the patient he was still somewhat groggy from anesthesia and so wasn't the best historian but did wake up  some afterwards during rounds with staff. Currently, the patient understandably endorses pain in the right shoulder but denies any other joint pain and denies any spinal tenderness.     Past Medical History:   Diagnosis Date    Anxiety     Depression     Diabetes (CMS HCC)     Diabetes mellitus, type 2 (CMS HCC)     GERD (gastroesophageal reflux disease)     MRSA (methicillin resistant staph aureus) culture positive 08/06/2019    L eye cellulitis    Osteomyelitis (CMS HCC)     Peripheral neuropathy          Past Surgical History:   Procedure Laterality Date    MANDIBLE SURGERY      SHOULDER SURGERY           Past Family History:    Family Medical History:     Problem Relation (Age of Onset)    Diabetes Mother, Father        No Known Allergies  Medications Prior to Admission     Prescriptions    acetaminophen (TYLENOL) 325 mg Oral Tablet    Take 2 Tabs (650 mg total) by mouth Every 4 hours as needed    buPROPion (WELLBUTRIN SR) 150 mg Oral tablet sustained-release 12 hr    Take 150 mg by mouth Once a day    famotidine (PEPCID) 40 mg Oral Tablet    Take 40 mg by mouth Every evening    gabapentin (NEURONTIN) 600 mg Oral Tablet    Take 1,200 mg by mouth Three times a day    hydrOXYzine pamoate (VISTARIL) 25 mg Oral Capsule    Take 25 mg by mouth Four times a day as needed for Anxiety    insulin glargine 100 unit/mL Subcutaneous injection (vial)    25 Units by Subcutaneous route Every night    oxyCODONE (ROXICODONE) 5 mg Oral Tablet    Take 1 Tab (5 mg total) by mouth Every 8 hours as needed Please take 1-2 tablets every 8 hours for pain as needed.    thiamine (VITAMIN B1) 100 mg Oral Tablet    Take 100 mg by mouth Once a day        buPROPion [facility] SR) sustained release tablet, 150 mg, Oral, Daily  cefepime (MAXIPIME) 2 g in NS 100 mL IVPB, 2 g, Intravenous, Q12H  DAPTOmycin (CUBICIN) 450 mg in NS 50 mL IVPB, 6 mg/kg (Adjusted), Intravenous, Q24H  famotidine (PEPCID) tablet, 40 mg, Oral, QPM  gabapentin  (NEURONTIN) capsule, 400 mg, Oral, 2x/day  hydrOXYzine pamoate (VISTARIL) capsule, 25 mg, Oral, 4x/day PRN  insulin glargine 100 units/mL injection, 25 Units, Subcutaneous, NIGHTLY  lidocaine-menthol (LIDOPATCH) 3.6%-1.25% patch, 1 Patch, Transdermal, Daily  LR premix infusion, , Intravenous, Continuous  magnesium sulfate 2 G in SW 50 mL premix IVPB, 2 g, Intravenous, Once  metroNIDAZOLE (FLAGYL) tablet, 500 mg, Oral, 3x/day  NS flush syringe, 2-6 mL, Intracatheter, Q8HRS   And  NS flush syringe, 2-6 mL, Intracatheter, Q1 MIN PRN  NS flush syringe, 2-6 mL, Intracatheter, Q8HRS   And  NS flush syringe, 2-6 mL, Intracatheter, Q1 MIN PRN  oxyCODONE concentrate (ROXICODONE INTENSOL) 10 mg per 0.5 mL oral liquid, 5 mg, Oral, Q4H PRN   Or  oxyCODONE concentrate (ROXICODONE INTENSOL) 10 mg per 0.5 mL oral liquid, 10 mg, Oral, Q4H PRN  polyethylene glycol (MIRALAX) oral packet, 17 g, Oral, Daily  sennosides-docusate sodium (SENOKOT-S) 8.6-50mg per tablet, 1 Tablet, Oral, 2x/day  SSIP insulin lispro (HUMALOG) 100 units/mL injection, 0-12 Units, Subcutaneous, 4x/day PRN  thiamine-vitamin B1 (BETAXIN) tablet, 100 mg, Oral, Daily      Antibiotics (From admission, onward)    Start     Stop Route Frequency    04/02/20 1007  gentamicin 40 mg/mL injection for [facility] antibiotic bead kit  [facility] antibiotic bead kit medication orders)      07/25 1020 OTHER INTRA-OP ONCE    04/02/20 0942  vancomycin (VANCOCIN) powder for cement      07/25 1021 OTHER INTRA-OP ONCE    04/02/20 0200  cefepime (MAXIPIME) 2 g in NS 100 mL IVPB  (ceFEPime (MAXIPIME) IVPB load & maintenance dose)      -- IV EVERY 12 HOURS    04/01/20 1500  DAPTOmycin (CUBICIN) 450 mg in NS 50 mL IVPB  (daptomycin (CUBICIN) IVPB with CK level - BMI less than 30)      -- IV EVERY 24 HOURS    04/01/20 1500  metroNIDAZOLE (FLAGYL) tablet      -- Oral 3 TIMES DAILY    04/01/20 1400  cefepime (MAXIPIME) 2 g in NS 100 mL IVPB  (ceFEPime (MAXIPIME) IVPB load & maintenance dose)       07/24 1557 IV NOW          Patient Lines/Drains/Airways Status    Active Line / Dialysis Catheter / Dialysis Graft / Drain / Airway / Wound     Name: Placement date: Placement time: Site: Days:    Peripheral IV Mid;Left Forearm  04/02/20   0754   less than 1    PICC Double Lumen Left;Basilic Vein  --   --   --      Hemovac Lateral;Right;Upper Back  04/02/20   0912   less than 1    Hemovac Right;Upper;Medial Back  --   --       Wound Vac Right Shoulder  04/02/20   --   less than 1    Wound (Non-Surgical) Left Heel  08/11/19   --   235    Wound (Non-Surgical) Right Heel  08/10/19   --   236    Wound (Non-Surgical) Left Eye  08/11/19   --   235    Surgical Incision Lateral;Right Shoulder  04/02/20   --   less than 1              Social History:     Lives in [place], [place]. He reports that he lives with his daughter. He reports a small 5lb dog at home. He also notes that he "has used powder" in the past, but denies any IVDU. He has alcohol use disorder but reported on admission that he was down to 3-4 beers daily.   ROS:   ROS is positive for right shoulder pain and peripheral neuropathy (chronic), mobility issues (chronic)   ROS is negative for chest pain, shortness of breath, abdominal pain, N/V, loss of appetite, dysuria, spinal tenderness.  Other ROS was negative.     EXAM:  BP 133/64    Pulse 80    Temp 36.5 C (97.7 F)    Resp 17    Ht 1.803 m (5' 10.98")    Wt 83.6 kg (184 lb 4.9 oz)    SpO2 96%    BMI 25.72 kg/m   Temp  Avg: 36.9 C (98.5 F)  Min: 36.5 C (97.7 F)  Max: 37.2 C (99 F)    General: no distress and somewhat groggy post operatively   Neuro: Alert, but tangential. He was oriented to person, place, slightly disoriented to time (likely related to being post operative). Motor function 5/5 RUE, LUE was in a sling but grip was intact.   Eyes: Sclera non icteric, no conjunctival injfection.   Head:  Normocephalic. No masses, lesions, tenderness or abnormalities  Neck: No adenopathy  Lungs: clear  to auscultation bilaterally.   Cardiovascular:    RRR, S1 and S2 clear without murmur  Abdomen: Bowel sounds positive, Soft, and Non tender to palpation.   Back: No spinal or CVA tenderness  Genito-urinary: Deferred  Extremities: no cyanosis or edema and the bilateral feet are dressed with calcaneal dressings. the shoulder is in a sling there are drains in place in the right shoulder. The left shoulder appears normal.   Musculo-skeletal as above, no additional joint swelling or erythema.   Skin: No rashes and the feet have bilateral calcaneal dressing. There is some erythema on the toes which looks mostly like irritation as opposed to infectious. There is some onychomycosis.     Labs:  I have reviewed all lab results.    Microbiology:   Outside Blood Cultures: Group B Streptococcus  Repeat Blood cultures:  + GPC (unclear on the bottle count, per primary team a bottle was mislabeled)  OR Cultures 04/02/2020: Pending  Sterile Site Culture 08/11/2019: +MRSA  COVID-19 04/01/2020: Negative.     Imaging Studies:    MRI of Right Shoulder is personally reviewed and the report is noted.     Lucas A. Hamrick, D.O.  PGY-4, Internal [place]   [phone]        I saw and examined the patient.  I reviewed the resident's note.  I agree with the findings and plan of [name] as documented in the resident's note.  Any exceptions/additions are edited/noted.    Ward Chatters, MD

## 2020-04-03 DIAGNOSIS — N189 Chronic kidney disease, unspecified: Secondary | ICD-10-CM

## 2020-04-03 DIAGNOSIS — L97529 Non-pressure chronic ulcer of other part of left foot with unspecified severity: Secondary | ICD-10-CM

## 2020-04-03 DIAGNOSIS — E11621 Type 2 diabetes mellitus with foot ulcer: Secondary | ICD-10-CM

## 2020-04-03 DIAGNOSIS — E1142 Type 2 diabetes mellitus with diabetic polyneuropathy: Secondary | ICD-10-CM

## 2020-04-03 DIAGNOSIS — E46 Unspecified protein-calorie malnutrition: Secondary | ICD-10-CM

## 2020-04-03 DIAGNOSIS — Z87891 Personal history of nicotine dependence: Secondary | ICD-10-CM

## 2020-04-03 DIAGNOSIS — L97429 Non-pressure chronic ulcer of left heel and midfoot with unspecified severity: Secondary | ICD-10-CM

## 2020-04-03 LAB — BASIC METABOLIC PANEL, FASTING
ANION GAP: 4 mmol/L (ref 4–13)
BUN/CREA RATIO: 17 (ref 6–22)
BUN: 26 mg/dL — ABNORMAL HIGH (ref 8–25)
CALCIUM: 8.3 mg/dL — ABNORMAL LOW (ref 8.8–10.2)
CHLORIDE: 109 mmol/L (ref 96–111)
CO2 TOTAL: 21 mmol/L — ABNORMAL LOW (ref 23–31)
CREATININE: 1.52 mg/dL — ABNORMAL HIGH (ref 0.75–1.35)
ESTIMATED GFR: 49 mL/min/BSA — ABNORMAL LOW (ref >=60)
GLUCOSE: 312 mg/dL — ABNORMAL HIGH (ref 70–99)
POTASSIUM: 5.3 mmol/L — ABNORMAL HIGH (ref 3.5–5.1)
SODIUM: 134 mmol/L — ABNORMAL LOW (ref 136–145)

## 2020-04-03 LAB — CBC WITH DIFF
BASOPHIL #: <0.1 10*3/uL (ref <=0.20)
BASOPHIL %: 0 %
EOSINOPHIL #: <0.1 10*3/uL (ref <=0.50)
EOSINOPHIL %: 0 %
HCT: 25.1 % — ABNORMAL LOW (ref 38.9–52.0)
HGB: 8 g/dL — ABNORMAL LOW (ref 13.4–17.5)
IMMATURE GRANULOCYTE #: 0.13 10*3/uL — ABNORMAL HIGH (ref <0.10)
IMMATURE GRANULOCYTE %: 1 % (ref 0–1)
LYMPHOCYTE #: 0.74 10*3/uL — ABNORMAL LOW (ref 1.00–4.80)
LYMPHOCYTE %: 6 %
MCH: 29.4 pg (ref 26.0–32.0)
MCHC: 31.9 g/dL (ref 31.0–35.5)
MCV: 92.3 fL (ref 78.0–100.0)
MONOCYTE #: 0.71 10*3/uL (ref 0.20–1.10)
MONOCYTE %: 6 %
MPV: 9.9 fL (ref 8.7–12.5)
NEUTROPHIL #: 11.41 10*3/uL — ABNORMAL HIGH (ref 1.50–7.70)
NEUTROPHIL %: 87 %
PLATELETS: 268 10*3/uL (ref 150–400)
RBC: 2.72 10*6/uL — ABNORMAL LOW (ref 4.50–6.10)
RDW-CV: 15 % (ref 11.5–15.5)
WBC: 13 10*3/uL — ABNORMAL HIGH (ref 3.7–11.0)

## 2020-04-03 LAB — C-REACTIVE PROTEIN(CRP),INFLAMMATION: CRP INFLAMMATION: 72.9 mg/L — ABNORMAL HIGH (ref <8.0)

## 2020-04-03 LAB — POC BLOOD GLUCOSE (RESULTS)
GLUCOSE, POC: 319 mg/dL — ABNORMAL HIGH (ref 70–105)
GLUCOSE, POC: 270 mg/dL — ABNORMAL HIGH (ref 70–105)
GLUCOSE, POC: 241 mg/dL — ABNORMAL HIGH (ref 70–105)
GLUCOSE, POC: 138 mg/dL — ABNORMAL HIGH (ref 70–105)

## 2020-04-03 LAB — SYPHILIS SCREENING ALGORITHM WITH REFLEX (TITER, TP-PA), SERUM: TREPONEMAL AB QUALITATIVE: NONREACTIVE

## 2020-04-03 LAB — MAGNESIUM: MAGNESIUM: 2.1 mg/dL (ref 1.8–2.6)

## 2020-04-03 LAB — CREATINE KINASE (CK), TOTAL, SERUM OR PLASMA: CREATINE KINASE: 97 U/L (ref 45–225)

## 2020-04-03 LAB — HEPATITIS C VIRUS (HCV) RNA DETECTION AND QUANTIFICATION, PCR, PLASMA: HCV QUANTITATIVE PCR: NOT DETECTED

## 2020-04-03 MED ORDER — OXYCODONE 5 MG TABLET
5.0000 mg | ORAL_TABLET | ORAL | Status: DC | PRN
Start: 2020-04-03 — End: 2020-04-05

## 2020-04-03 MED ORDER — LACTULOSE 20 GRAM/30 ML ORAL SOLUTION
45.0000 mL | Freq: Three times a day (TID) | ORAL | Status: AC
Start: 2020-04-03 — End: 2020-04-05
  Administered 2020-04-03 (×2): 45 mL via ORAL
  Administered 2020-04-04 – 2020-04-05 (×4): 0 mL via ORAL
  Filled 2020-04-03 (×3): qty 30

## 2020-04-03 MED ORDER — INSULIN LISPRO (U-100) 100 UNIT/ML SUBCUTANEOUS SOLUTION
3.0000 [IU] | Freq: Three times a day (TID) | SUBCUTANEOUS | Status: DC
Start: 2020-04-04 — End: 2020-04-10
  Administered 2020-04-04 – 2020-04-10 (×20): 3 [IU] via SUBCUTANEOUS

## 2020-04-03 MED ORDER — OXYCODONE 5 MG TABLET
10.0000 mg | ORAL_TABLET | ORAL | Status: DC | PRN
Start: 2020-04-03 — End: 2020-04-05
  Administered 2020-04-03 – 2020-04-05 (×11): 10 mg via ORAL
  Filled 2020-04-03 (×11): qty 2

## 2020-04-03 NOTE — Consults (Signed)
INFECTIOUS DISEASE CONSULTATION    Patient Name: [name] [name] Number: [id number]  Date of Service: 04/03/2020  Date of Birth: Jan 31, 1961    Hospital Day:  LOS: 2 days     Requesting Service: HOSPITALIST 11  Requesting Physician: Randie Heinz, DO    Reason for Consultation: concern for infectious cause     Subjective: he was having some pain on his shoulder 9/10 when first I saw him in the morning but in my second visit with ID team , he was okay. Wound vac in place. No fever or chills. He has history of peripheral neuropathy both in his lower extremities and upper extremities. He denies any nausea, vomiting, diarrhea or constipation. He is tolerating well his antibiotics.     Information Source: Patient and electronic medical record    Past Medical History:  Past Medical History:   Diagnosis Date    Anxiety     Depression     Diabetes (CMS HCC)     Diabetes mellitus, type 2 (CMS HCC)     GERD (gastroesophageal reflux disease)     MRSA (methicillin resistant staph aureus) culture positive 08/06/2019    L eye cellulitis    Osteomyelitis (CMS HCC)     Peripheral neuropathy          Past Surgical History:  Past Surgical History:   Procedure Laterality Date    MANDIBLE SURGERY      SHOULDER SURGERY           Family History:  Family Medical History:     Problem Relation (Age of Onset)    Diabetes Mother, Father            Allergies:  No Known Allergies    Inpatient Medications:  buPROPion (WELLBUTRIN SR) sustained release tablet, 150 mg, Oral, Daily  cefepime (MAXIPIME) 2 g in NS 100 mL IVPB, 2 g, Intravenous, Q12H  DAPTOmycin (CUBICIN) 450 mg in NS 50 mL IVPB, 6 mg/kg (Adjusted), Intravenous, Q24H  famotidine (PEPCID) tablet, 40 mg, Oral, QPM  gabapentin (NEURONTIN) capsule, 400 mg, Oral, 2x/day  heparin 5,000 unit/mL injection, 5,000 Units, Subcutaneous, Q8HRS  hydrOXYzine pamoate (VISTARIL) capsule, 25 mg, Oral, 4x/day PRN  insulin glargine 100 units/mL injection, 25 Units, Subcutaneous,  NIGHTLY  lidocaine-menthol (LIDOPATCH) 3.6%-1.25% patch, 1 Patch, Transdermal, Daily  metroNIDAZOLE (FLAGYL) tablet, 500 mg, Oral, 3x/day  morphine 4 mg/mL injection, 3 mg, Intravenous, Q2H PRN  NS flush syringe, 2-6 mL, Intracatheter, Q8HRS   And  NS flush syringe, 2-6 mL, Intracatheter, Q1 MIN PRN  NS flush syringe, 2-6 mL, Intracatheter, Q8HRS   And  NS flush syringe, 2-6 mL, Intracatheter, Q1 MIN PRN  nystatin (NYSTOP) 100,000 units/g topical powder, , Apply Topically, 4x/day  oxyCODONE concentrate (ROXICODONE INTENSOL) 10 mg per 0.5 mL oral liquid, 5 mg, Oral, Q4H PRN   Or  oxyCODONE concentrate (ROXICODONE INTENSOL) 10 mg per 0.5 mL oral liquid, 10 mg, Oral, Q4H PRN  polyethylene glycol (MIRALAX) oral packet, 17 g, Oral, Daily  sennosides-docusate sodium (SENOKOT-S) 8.6-50mg per tablet, 2 Tablet, Oral, 2x/day  SSIP insulin lispro (HUMALOG) 100 units/mL injection, 0-12 Units, Subcutaneous, 4x/day PRN  thiamine-vitamin B1 (BETAXIN) tablet, 100 mg, Oral, Daily        Current Antimicrobials:  Antibiotics (last 24 hours)     Date/Time Action Medication Dose Rate    04/03/20 0212 New Bag/New Syringe    cefepime (MAXIPIME) 2 g in NS 100 mL IVPB 2 g 33 mL/hr    04/03/20 0002  Given    metroNIDAZOLE (FLAGYL) tablet 500 mg     04/02/20 1607 Given    metroNIDAZOLE (FLAGYL) tablet 500 mg     04/02/20 1505 New Bag/New Syringe    DAPTOmycin (CUBICIN) 450 mg in NS 50 mL IVPB 450 mg 118 mL/hr    04/02/20 1505 New Bag/New Syringe    cefepime (MAXIPIME) 2 g in NS 100 mL IVPB 2 g 33 mL/hr    04/02/20 1204 Given    metroNIDAZOLE (FLAGYL) tablet 500 mg     04/02/20 1021 Given    vancomycin (VANCOCIN) powder for cement 1 g     04/02/20 1020 Given    gentamicin 40 mg/mL injection for STIMULAN antibiotic bead kit 480 mg          Lines:  Patient Lines/Drains/Airways Status    Active Line / Dialysis Catheter / Dialysis Graft / Drain / Airway / Wound     Name: Placement date: Placement time: Site: Days:    Peripheral IV Mid;Left Forearm   04/02/20   0754   1    PICC Double Lumen Left;Basilic Vein  --   --   --      Hemovac Lateral;Right;Upper Back  04/02/20   0912   less than 1    Hemovac Right;Upper;Medial Back  --   --       Wound Vac Right Shoulder  04/02/20   --   1    Wound (Non-Surgical) Left Heel  08/11/19   --   236    Wound (Non-Surgical) Right Heel  08/10/19   --   237    Wound (Non-Surgical) Left Eye  08/11/19   --   236    Surgical Incision Lateral;Right Shoulder  04/02/20   --   1               Review of Systems:  Constitutional: Negative for fevers, chills, sweats, weight loss, and fatigue.  Eyes: Negative for acute visual disturbance, photophobia, and redness.  Ears, nose, mouth, throat, and face: Negative for sinus congestion, odynophagia, and throat soreness.  Respiratory: Negative for shortness of breath and cough.  Cardiovascular: Negative for chest pain, chest pressure, and bilateral lower extremity edema.  Gastrointestinal: Negative for abdominal pain, nausea, emesis, diarrhea, and bloody stools.  Genitourinary: Negative for dysuria and hematuria.  Integument: left heel ulcer  Hematologic and lymphatic: Negative for easy bruising and lymphadenopathy.  Musculoskeletal: Left heel ulcer, right shoulder pain   Neurological: Negative for headaches, dizziness, and seizures.  Behavioral/Psychiatric: anxiety and depression history   Endocrine: diabetic    Physical Exam:  Vitals: BP (!) 112/55 Comment: RN notified   Pulse 84    Temp 37 C (98.6 F)    Resp 18    Ht 1.803 m (5' 10.98")    Wt 83.6 kg (184 lb 4.9 oz)    SpO2 97%    BMI 25.72 kg/m       General: No acute distress.  Non-toxic appearing.  Eyes: Pupils equal round and reactive bilaterally.  Extraocular muscles intact.  No conjunctival injection or scleral icterus.  ENT: Face symmetrical.  Mucous membranes pink and moist.  No oral candidiasis or mucosal ulcers.  Neck: Supple and symmetrical.  No cervical lymphadenopathy.  Lungs: Unlabored respirations. Clear to auscultation  bilaterally.  No wheezes or crackles.    Cardiovascular: Regular rate and rhythm.  No murmur or gallop.   Abdomen: Normoactive bowel sounds.  Nondistended, soft, and nontender to palpation.  No hepatosplenomegaly.  Extremities: Right shoulder wound vac in place.   Musculoskeletal:      Skin: see above   Neurologic: Alert and oriented X 3.  Cranial nerves II-XII are intact.  Moves all extremities without focal deficit. No sensation up to mid tibia.   Psychiatric: Appropriate affect and behavior.  Intact memory.  Fluent speech.     Laboratory Studies:    Results for orders placed or performed during the hospital encounter of 04/01/20 (from the past 24 hour(s))   OR CULTURE(AEROBIC AND GRAM STAIN)    Specimen: Tissue   Result Value Ref Range    FLC No Growth 18-24 hrs.     GRAM STAIN 3+ Several PMNs     GRAM STAIN No Organisms Seen    OR CULTURE(AEROBIC AND GRAM STAIN)    Specimen: Tissue   Result Value Ref Range    FLC No Growth 18-24 hrs.     GRAM STAIN 4+ Many PMNs     GRAM STAIN No Organisms Seen    OR CULTURE(AEROBIC AND GRAM STAIN)    Specimen: Tissue   Result Value Ref Range    FLC No Growth 18-24 hrs.     GRAM STAIN 3+ Several PMNs     GRAM STAIN No Organisms Seen    OR CULTURE(AEROBIC AND GRAM STAIN)    Specimen: Tissue   Result Value Ref Range    FLC No Growth 18-24 hrs.     GRAM STAIN 3+ Several PMNs     GRAM STAIN No Organisms Seen    OR CULTURE(AEROBIC AND GRAM STAIN)    Specimen: Tissue   Result Value Ref Range    FLC No Growth 18-24 hrs.     GRAM STAIN 3+ Several PMNs     GRAM STAIN No Organisms Seen    OR CULTURE(AEROBIC AND GRAM STAIN)    Specimen: Tissue   Result Value Ref Range    FLC No Growth 18-24 hrs.     GRAM STAIN 3+ Several PMNs     GRAM STAIN No Organisms Seen    CBC/DIFF    Narrative    The following orders were created for panel order CBC/DIFF.  Procedure                               Abnormality         Status                     ---------                               -----------          ------                     CBC WITH TUUE[[id number]]                Abnormal            Final result                 Please view results for these tests on the individual orders.   BASIC METABOLIC PANEL, FASTING   Result Value Ref Range    SODIUM 134 (L) 136 - 145 mmol/L    POTASSIUM 5.3 (H) 3.5 - 5.1 mmol/L    CHLORIDE 109 96 - 111 mmol/L  CO2 TOTAL 21 (L) 23 - 31 mmol/L    ANION GAP 4 4 - 13 mmol/L    CALCIUM 8.3 (L) 8.8 - 10.2 mg/dL    GLUCOSE 312 (H) 70 - 99 mg/dL    BUN 26 (H) 8 - 25 mg/dL    CREATININE 1.52 (H) 0.75 - 1.35 mg/dL    BUN/CREA RATIO 17 6 - 22    ESTIMATED GFR 49 (L) >=60 mL/min/BSA   MAGNESIUM   Result Value Ref Range    MAGNESIUM 2.1 1.8 - 2.6 mg/dL   CREATINE KINASE (CK), TOTAL, SERUM   Result Value Ref Range    CREATINE KINASE 97 45 - 225 U/L   C-REACTIVE PROTEIN(CRP),INFLAMMATION   Result Value Ref Range    CRP INFLAMMATION 72.9 (H) <8.0 mg/L   CBC WITH DIFF   Result Value Ref Range    WBC 13.0 (H) 3.7 - 11.0 x103/uL    RBC 2.72 (L) 4.50 - 6.10 x106/uL    HGB 8.0 (L) 13.4 - 17.5 g/dL    HCT 25.1 (L) 38.9 - 52.0 %    MCV 92.3 78.0 - 100.0 fL    MCH 29.4 26.0 - 32.0 pg    MCHC 31.9 31.0 - 35.5 g/dL    RDW-CV 15.0 11.5 - 15.5 %    PLATELETS 268 150 - 400 x103/uL    MPV 9.9 8.7 - 12.5 fL    NEUTROPHIL % 87 %    LYMPHOCYTE % 6 %    MONOCYTE % 6 %    EOSINOPHIL % 0 %    BASOPHIL % 0 %    NEUTROPHIL # 11.41 (H) 1.50 - 7.70 x103/uL    LYMPHOCYTE # 0.74 (L) 1.00 - 4.80 x103/uL    MONOCYTE # 0.71 0.20 - 1.10 x103/uL    EOSINOPHIL # <0.10 <=0.50 x103/uL    BASOPHIL # <0.10 <=0.20 x103/uL    IMMATURE GRANULOCYTE % 1 0 - 1 %    IMMATURE GRANULOCYTE # 0.13 (H) <0.10 x103/uL   POC BLOOD GLUCOSE (RESULTS)   Result Value Ref Range    GLUCOSE, POC 144 (H) 70 - 105 mg/dl   POC BLOOD GLUCOSE (RESULTS)   Result Value Ref Range    GLUCOSE, POC 245 (H) 70 - 105 mg/dl   POC BLOOD GLUCOSE (RESULTS)   Result Value Ref Range    GLUCOSE, POC 272 (H) 70 - 105 mg/dl   POC BLOOD GLUCOSE (RESULTS)   Result Value  Ref Range    GLUCOSE, POC 319 (H) 70 - 105 mg/dl         Microbiology:  Hospital Encounter on 04/01/20 (from the past 96 hour(s))   BLOOD CULTURE (DETERMINE LINE SEPSIS)    Collection Time: 04/01/20  2:19 PM    Specimen: Blood   Culture Result Status    BLOOD CULTURE, LINE SEPSIS Abnormal Stain (AA) Preliminary    GRAM STAIN Gram Positive Cocci/Clusters (A) Preliminary   MRSA/MSSA MOLECULAR SCREEN, BLOOD    Collection Time: 04/01/20  2:19 PM    Specimen: Blood   Culture Result Status    METHICILLIN RESISTANCE Negative Final    STAPHYLOCOCCUS AUREUS Negative Final    Narrative    INTERPRETATION: No Staphylococcus aureus identified    Preliminary molecular testing did NOT detect genetic markers for Staph aureus (spa) or associated methicillin resistance (mecA and SCCmec).  Standard isolate identification and antimicrobial susceptibility testing results are forthcoming.     Methicillin resistance/susceptibility in organisms other than Staphylococcus aureus  CANNOT be determined by this assay.    Performed by PCR using the MRSA/SA Blood Culture Assay and the GeneXpert system. Test performance on this bottle type verified at [facility] using an FDA cleared assay. Results are preliminary and should be interpreted within the clinical context.   BLOOD CULTURE (DETERMINE LINE SEPSIS)    Collection Time: 04/01/20  3:46 PM    Specimen: Blood   Culture Result Status    BLOOD CULTURE, LINE SEPSIS No Growth 18-24 hrs. Preliminary   OR CULTURE(AEROBIC AND GRAM STAIN)    Collection Time: 04/02/20  9:09 AM    Specimen: Tissue   Culture Result Status    FLC No Growth 18-24 hrs. Preliminary    GRAM STAIN 3+ Several PMNs Preliminary    GRAM STAIN No Organisms Seen Preliminary   OR CULTURE(AEROBIC AND GRAM STAIN)    Collection Time: 04/02/20  9:09 AM    Specimen: Tissue   Culture Result Status    FLC No Growth 18-24 hrs. Preliminary    GRAM STAIN 4+ Many PMNs Preliminary    GRAM STAIN No Organisms Seen Preliminary   OR CULTURE(AEROBIC AND  GRAM STAIN)    Collection Time: 04/02/20  9:09 AM    Specimen: Tissue   Culture Result Status    FLC No Growth 18-24 hrs. Preliminary    GRAM STAIN 3+ Several PMNs Preliminary    GRAM STAIN No Organisms Seen Preliminary   OR CULTURE(AEROBIC AND GRAM STAIN)    Collection Time: 04/02/20  9:26 AM    Specimen: Tissue   Culture Result Status    FLC No Growth 18-24 hrs. Preliminary    GRAM STAIN 3+ Several PMNs Preliminary    GRAM STAIN No Organisms Seen Preliminary   OR CULTURE(AEROBIC AND GRAM STAIN)    Collection Time: 04/02/20  9:26 AM    Specimen: Tissue   Culture Result Status    FLC No Growth 18-24 hrs. Preliminary    GRAM STAIN 3+ Several PMNs Preliminary    GRAM STAIN No Organisms Seen Preliminary   OR CULTURE(AEROBIC AND GRAM STAIN)    Collection Time: 04/02/20  9:33 AM    Specimen: Tissue   Culture Result Status    FLC No Growth 18-24 hrs. Preliminary    GRAM STAIN 3+ Several PMNs Preliminary    GRAM STAIN No Organisms Seen Preliminary      Microbiology from [facility]:  Wound culture from heel on 03/08/2020 grew Streptococcus agalactiae   Blood culture from 03/08/2020 grew Streptococcus agalactiae in 1 set while the other set remain was no growth   OR culture from abscess on 03/23/2020 was no growth final   OR culture from 03/25/2020 was no growth final   OR culture from 03/27/2020 was no growth final         Imaging Studies:  Results for orders placed or performed during the hospital encounter of 04/01/20 (from the past 72 hour(s))   XR AP MOBILE CHEST     Status: None    Narrative    XR AP MOBILE CHEST performed on 04/01/2020 1:27 PM    INDICATION: 59 years old Male; Clear PICC    TECHNIQUE: 1 views of the chest; 1 images    COMPARISON: Chest radiograph 08/13/2019.    FINDINGS: No focal consolidation, pleural effusion or pneumothorax. The  heart is stable in size without pulmonary edema. Osseous structures are  intact. A left PICC is noted with the tip projecting over the cavoatrial  junction. Tubing  projects over the right chest wall, likely external.      Impression    No acute cardiopulmonary process. Left PICC projects over the  cavoatrial junction.   XR SHOULDER RIGHT     Status: None (Preliminary result)    Narrative    Karlene Lineman  Male, 59 years old.    XR SHOULDER RIGHT performed on 04/02/2020 12:09 AM.    REASON FOR EXAM:  Trauma, r/o fracture/dislocation (need lateral axial view  as well)    TECHNIQUE: 4 views/4 images submitted for interpretation.    COMPARISON: Outside right shoulder MRI 03/26/2020.      Impression    Patient has known osteomyelitis of the proximal humerus and  scapula and is status post irrigation and debridement. There are a few  focal osteopenic areas which may correlate with this; however, no definite  bony erosions are identified. There is diffuse soft tissue swelling about  the right shoulder. Surgical drains are in place. No acute fracture.     XR HUMERUS RIGHT     Status: None (Preliminary result)    Narrative    Karlene Lineman  Male, 59 years old.    XR HUMERUS RIGHT 2 VIEWS performed on 04/02/2020 1:01 AM.    REASON FOR EXAM:  Trauma, r/o fracture/dislocation    TECHNIQUE: 2 views/2 images submitted for interpretation.    COMPARISON: Right shoulder 04/02/2020 and outside MRI right shoulder  03/26/2020      Impression    Patient has known history of osteomyelitis of the proximal  humerus and scapula. No definitive bony erosions are seen. No definitive  bony erosions are detected. The patient is status post irrigation and  debridement with partially imaged surgical drains. No acute fracture.     MRI SCAPULA RIGHT W/WO CONTRAST     Status: None    Narrative    Lola Banker  Male, 59 years old.    MRI SCAPULA RIGHT W/WO CONTRAST performed on 04/02/2020 5:48 AM.    REASON FOR EXAM:  infection, need to see entire scapula and surrounding  soft tissue, OR pending      INTRAVENOUS CONTRAST: 8 ml's of Gadavist  CREATININE/GFR: CR:1.36 mg/dL on 04/01/2020 GFR 56    TECHNIQUE: Multiplanar  multisequence MR imaging of the right scapula  without and with contrast    COMPARISON: Radiographs dated 04/01/2020    FINDINGS: Image quality is degraded secondary to motion artifact. Two  surgical drains are in place.    There are multiple abscesses involving the supraspinatus, infraspinatus,  subscapularis, deltoid, biceps, rhomboid, latissimus dorsi, and trapezius  muscles. There is also abnormal bone marrow signal involving the humeral  head, greater and lesser tuberosities, and surgical neck, compatible with  osteomyelitis. The bone marrow signal of the glenoid is preserved. There is  a small amount of fluid in the glenohumeral joint.      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      Impression:  This is a 59 years old man with history of diabetes mellitus complicated by likely CKD, peripheral neuropathy, history of diabetic foot ulcer, history of osteomyelitis of the foot, history of MRSA preseptal eye cellulitis in 2020, anxiety, and depression who fell and developed right shoulder found to have osteomyelitis of the proximal humerus and scapula with extensive right shoulder pyomyositis. He underwent I&D of the shoulder and humerus yesterday and OR culture pending result and gram stain positive for PMN +3. His blood  culture from 04/01/20 is growing GPC in clusters (CoNS) and not identified as staph aureus. At out side facility his blood culture was positive for GBS. Given his diabetic status likely his shoulder and proximal humerus infections is likely hematogenous in origin (possible from GBS bacteremia).     Recommendations:  Micro records obtained from [facility] and summarized above  Continue metronidazole 500 mg oral Q 8 hours for at least 6 weeks from the last day of surgery   Continue Daptomycin current dose for 6 weeks from the last day of surgery   Cefepime 2 gram IV Q 12 hours for 6 weeks from the last day of surgery   Continue wound  care of left heel ulcer   Wound vac per surgery   Follow blood culture result (final)  Follow oral culture result   We will follow   He needs to follow ID clinic post discharge.       Please continue to follow CBC with differential, BUN, creatinine, AST, ALT, alkaline phosphatase, total bilirubin, and CRP while on antimicrobial therapy.    Thank you for this consultation.  We will continue to follow.  Please call or page the Infectious Diseases Service with any questions regarding this patient.      Imagene Sheller, MD   ID Fellow   Pager #  [phone]      04/03/2020  I saw and examined the patient.  I reviewed the fellow's note.  I agree with the findings and plan of care as documented in the fellow's note.  Any exceptions/additions are edited/noted.    Elenore Paddy, MD

## 2020-04-03 NOTE — Nurses Notes (Signed)
Went in to assess pt and he answered all orientation questions but it took him some time to answer what year he was born in. When having a conversation with him he also seems to be confused because he asked me if it was my boyfriend who has just came in his room a little bit ago and I told him no that I was at work and that I was not sure what he was talking about. The dayshift RN made me aware during shift change earlier that he acted in this manner.

## 2020-04-03 NOTE — Progress Notes (Addendum)
[facility]  General Medicine  Progress note    Date of Service:  04/03/2020  [name] [name], 59 y.o. male  Date of Admission:  04/01/2020  Date of Birth:  [birth year]  PCP: [facility]    Subjective:  Patient is more alert today.  He states that his pain is 8/10.  He said whenever we are giving him here is working well to control his pain and to "keep it going as long as possible." He denies any fever or chills.    Objective:  I reviewed all vital signs.  Examination:    Temperature: 37 C (98.6 F) Heart Rate: 84 BP (Non-Invasive): (!) 112/55 (RN notified)   Respiratory Rate: 18 SpO2: 97 %     Constitutional: appears chronically ill, pale, no distress and vital signs reviewed and discussed with patient  Eyes: Conjunctiva clear., Pupils equal and round, reactive to light and accomodation. , Sclera non-icteric.   ENT: ENMT without erythema or injection, mucous membranes moist.  Neck: no thyromegaly or lymphadenopathy and thyroid: not enlarged, symmetric, no tenderness/mass/nodules  Respiratory: Clear to auscultation and percussion bilaterally. , Thoracic excursion  Normal  Cardiovascular: regular rate and rhythm, S1, S2 normal, no murmur, click, rub or gallop     Pulses 2/4, trace pedal edema  Gastrointestinal: Soft, non-tender, Bowel sounds normal, non-distended  Genitourinary: Deferred  Musculoskeletal: Multiple drains coming from right shoulder.  Multiple staples from previous surgeries.  Integumentary:  Skin warm and dry and No rashes  Neurologic: Grossly normal, CN II - XII grossly intact , Alert and oriented x3, No tremor, No asterixis  Lymphatic/Immunologic/Hematologic: No lymphadenopathy  Psychiatric: Affect Normal  Glasgow: Total score only: 15  Coma (GCS Coma less than 8): Coma -Not applicable    Labs: I have reviewed all lab results.  Lab Results Today:    Results for orders placed or performed during the hospital encounter of 04/01/20 (from the past 24 hour(s))   POC BLOOD GLUCOSE  (RESULTS)   Result Value Ref Range    GLUCOSE, POC 144 (H) 70 - 105 mg/dl   POC BLOOD GLUCOSE (RESULTS)   Result Value Ref Range    GLUCOSE, POC 245 (H) 70 - 105 mg/dl   POC BLOOD GLUCOSE (RESULTS)   Result Value Ref Range    GLUCOSE, POC 272 (H) 70 - 105 mg/dl   BASIC METABOLIC PANEL, FASTING   Result Value Ref Range    SODIUM 134 (L) 136 - 145 mmol/L    POTASSIUM 5.3 (H) 3.5 - 5.1 mmol/L    CHLORIDE 109 96 - 111 mmol/L    CO2 TOTAL 21 (L) 23 - 31 mmol/L    ANION GAP 4 4 - 13 mmol/L    CALCIUM 8.3 (L) 8.8 - 10.2 mg/dL    GLUCOSE 312 (H) 70 - 99 mg/dL    BUN 26 (H) 8 - 25 mg/dL    CREATININE 1.52 (H) 0.75 - 1.35 mg/dL    BUN/CREA RATIO 17 6 - 22    ESTIMATED GFR 49 (L) >=60 mL/min/BSA   MAGNESIUM   Result Value Ref Range    MAGNESIUM 2.1 1.8 - 2.6 mg/dL   CREATINE KINASE (CK), TOTAL, SERUM   Result Value Ref Range    CREATINE KINASE 97 45 - 225 U/L   C-REACTIVE PROTEIN(CRP),INFLAMMATION   Result Value Ref Range    CRP INFLAMMATION 72.9 (H) <8.0 mg/L   CBC WITH DIFF   Result Value Ref Range    WBC 13.0 (  H) 3.7 - 11.0 x103/uL    RBC 2.72 (L) 4.50 - 6.10 x106/uL    HGB 8.0 (L) 13.4 - 17.5 g/dL    HCT 25.1 (L) 38.9 - 52.0 %    MCV 92.3 78.0 - 100.0 fL    MCH 29.4 26.0 - 32.0 pg    MCHC 31.9 31.0 - 35.5 g/dL    RDW-CV 15.0 11.5 - 15.5 %    PLATELETS 268 150 - 400 x103/uL    MPV 9.9 8.7 - 12.5 fL    NEUTROPHIL % 87 %    LYMPHOCYTE % 6 %    MONOCYTE % 6 %    EOSINOPHIL % 0 %    BASOPHIL % 0 %    NEUTROPHIL # 11.41 (H) 1.50 - 7.70 x103/uL    LYMPHOCYTE # 0.74 (L) 1.00 - 4.80 x103/uL    MONOCYTE # 0.71 0.20 - 1.10 x103/uL    EOSINOPHIL # <0.10 <=0.50 x103/uL    BASOPHIL # <0.10 <=0.20 x103/uL    IMMATURE GRANULOCYTE % 1 0 - 1 %    IMMATURE GRANULOCYTE # 0.13 (H) <0.10 x103/uL   POC BLOOD GLUCOSE (RESULTS)   Result Value Ref Range    GLUCOSE, POC 319 (H) 70 - 105 mg/dl     DNR Status:  Full Code    Assessment/Plan:   Active Hospital Problems    Diagnosis    Primary Problem: Abscess of right shoulder     [name] [name]  is a 59 y.o., white male with a history of anxiety, depression, diabetes with peripheral neuropathy, GERD, and diabetic foot wounds admitted as a direct admission from [facility] for 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    Positive blood culture (likely a contaminant)  -line sepsis protocol was ordered; however, the samples taken to the lab more never recorded regarding source.  -these blood cultures were repeated.  It is likely that the Staph epidermidis came from a through the skin blood culture sample and not the PICC line.     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  -orthopedics performed surgery hete on 04/02/20.  Currently has wound VAC on.  -on Dapto, cefepime, and Flagyl with end date 05/14/20. OPAT ordered. Has PICC.   -continue Oxycodone 5 or 10 for pain.  Morphine for breakthrough.  Discontinue morphine 04/04/20.    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  Advanced wound consulted.    -patient declined podiatry consult as he already follows with podiatrist outpatient.  -patient has known osteo on the left heel but wound appears to be well taken care of.  -patient was switched to daptomycin, cefepime, and Flagyl with a new end date of 05/14/20.   -stop drinking.    Thrombocytosis (RESOLVED)  -reactive and now resolving after surgery.  -stop drinking.    Anemia of chronic disease  -transfuse 2 units of blood on 04/01/20  -stop drinking.    Severe protein calorie malnutrition  -albumin very low likely from chronic inflammatory state and alcoholism.  -nutrition has been consulted.  -stop drinking.    CKD stage III  -continue to monitor.  Avoid nephrotoxic medication.  -stop  drinking.    Hyperkalemia  -continue to monitor.  Not significant enough to treat currently.  -thorough medicine check did not reveal any  medication that could exacerbate this condition.    Anxiety / Depression  -continue Bupropion and Vistaril.  -stop drinking.    Diabetes with peripheral neuropathy  -continue Lantus 25 units nightly.  -started on lispro 3 units before meals.  -continue sliding scale insulin protocol.  -continue Neurontin but at reduced dosage due to CKD stage 3.   -stop drinking.      Alcohol use disorder, recurrent severe  -drinks 3-4 beers daily now but admits to drinking hard liquor recently in cutting down.  -he denies any history of delirium tremens or withdrawal seizures.  -2 alcohol-related arrests but does not consider his drinking a problem.  -continue thiamine.  -stop drinking.    Physical deconditioning  -PT OT pending.  Patient will likely need placement.  -stop drinking.    GERD  -continue famotidine.  -stop drinking.    DVT/PE Prophylaxis: Heparin    Disposition:  PT OT pending.  Patient will likely need placement as he needs to walk with a walker and currently only has one functional arm.    Randie Heinz, DO  Assistant Professor   Department of Internal Medicine

## 2020-04-03 NOTE — Care Management Notes (Signed)
[facility] Management Initial Evaluation    Patient Name: [name] [name]  Date of Birth: 04-28-1961  Sex: male  Date/Time of Admission: 04/01/2020 12:23 PM  Room/Bed: 04/A  Payor: [place] MEDICARE / Plan: [place] MEDICARE ADVANTAGE PPO / Product Type: PPO /   Primary Care Providers:  [facility] [place] (General)    Pharmacy Info:   Preferred [facility], [place] [address]    Phone: [phone] Fax: [phone]    Hours: Not open 24 hours          Emergency Contact Info:   Extended Emergency Contact Information  Primary Emergency Contact: HUSSAM, MUNIZ ADAM  Mobile Phone: [phone]  Relation: Son    History:   [name] [name] is a 59 y.o., male, admitted for shoulder infection.    Height/Weight: 180.3 cm (5' 10.98") / 83.6 kg (184 lb 4.9 oz)     LOS: 2 days   Admitting Diagnosis: Abscess of right shoulder [L02.413]    Assessment:      04/03/20 1604   Assessment Details   Assessment Type Admission   Date of Care Management Update 04/03/20   Date of Next DCP Update 04/06/20   Care Management Plan   Discharge Planning Status initial meeting   Projected Discharge Date 04/06/20   Discharge plan discussed with: Patient   Patient choice offered to patient/family yes   Form for patient choice reviewed/signed and on chart no   Discharge Needs Assessment   Equipment Currently Used at Home walker, front wheeled;commode   Equipment Needed After Discharge none   Discharge Facility/Level of Care Needs SNF Placement (Medicare certified)(code 3)   Transportation Available ambulance   Referral Information   Admission Type inpatient   Arrived From acute hospital, other   [place] Other - See Comments  [employer])   Insurance Verified verified-no change   ADVANCE DIRECTIVES   Does the Patient have an Forensic scientist? No, Information Offered and Refused   Employment/Financial   Patient has Prescription Coverage?  Yes         Name of Insurance Coverage for Medications [place] Medicare   Financial Concerns none   Living Environment   Lives With alone   Home Safety   Home Assessment: No Problems Identified   Home Accessibility stairs to enter home   Home Main Entrance   Number of Stairs, Main Entrance two       Discharge Plan:  SNF Placement (Medicare certified) (code 3)  [place] met with patient for initial care management assessment.  Pt very drowsy and difficult to understand.  He reports the phone number for him is incorrect.  He tried to give me his daughter's phone number but could not recall it.      Pt states he lives in a 2 story house.  He first said alone and then it sounded like he lives with his daughter but it is still unclear.  He did state it is a one story house with 2 STE.     His PCP is [facility] and he was last seen approximately 4 months ago.  He could not recall the name of the pharmacy he uses.    He reports he has a FWW at home but cannot walk.  He does use a bedside commode.  No current use of home health services.  Information on advance directives offered and declined.     [facility] had received a call from [place] at [place] facilities earlier today.  She reports she has a current referral on the patient sent while he was at [facility] for [facility].  [place] spoke to the patient and he is agreeable to SNF since he is going to need long term IV ABX.  Will follow up tomorrow when he is more alert.     The patient will continue to be evaluated for developing discharge needs.     Case Manager: Alonza Smoker, [place] COORDINATOR  Phone: [phone]

## 2020-04-03 NOTE — Nurses Notes (Signed)
Unable to perform patient dressing change due to hospital being completely out of fibracol. Patient has border dressing for now.

## 2020-04-03 NOTE — Consults (Signed)
[facility]  Service: Call  Attending: Enis Slipper  Progress Note  04/03/2020    Name: [name] [name]  DOB: [birth year]  MRN: [id number]    RECENT ORTHO SURGERY:  - I&D R shoulder = 7/25 (Attending Surgeon - Mendel Ryder)    SUBJECTIVE:  59 y.o. male resting in bed. No issues overnight.    OBJECTIVE:  AF, VSS  BP 107/64    Pulse 74    Temp 37 C (98.6 F)    Resp 18    Ht 1.803 m (5' 10.98")    Wt 83.6 kg (184 lb 4.9 oz)    SpO2 97%    BMI 25.72 kg/m         GEN - NAD,  resting in bed  MSK RUE:  Inspection- iWV in place. Drains in place. Output 40-25-0 from subscap drain 0-0-0 from infra drain  Motor- weak thumbs up, okay sign, abducts fingers  Sensation- SILT r/u/m  Vasc- BCR fingertips      Today's Pertinent Labs:  Pending    ASSESSMENT:  59 y.o. male 1 Day Post-Op s/p I&D R shoulder    PLAN:  - Weightbearing: NWB RUE. Maintain brace for now  - PT/OT: ordered  - DVT prophylaxis: per primary  - Antibiotics: Dapto, Cefepime, Flagyl per primary  - Cx: No initial growth  - Pain: per primary  - Drain: in place, output above  - Dressing: plan to leave iWV for next 3-5 days  - Nursing instructions: mobilize  - Dispo: pending Abx  - Follow-up: will see back in Dr. Kathrin [place]ath[place] clinic 2 weeks    Princella Ion, MD  Resident, PGY-4  Department of Orthopaedics  PAGER = [phone]  04/03/2020 05:12

## 2020-04-03 NOTE — Care Plan (Signed)
Medical Nutrition Therapy Assessment        SUBJECTIVE : Received a consult for patient with "severe protein calorie malnutrition". Met with patient at bedside who reports he was eating well prior to admission, although questionable as he also stated he sometimes goes 2-3 days without eating anything due, "sometimes I just don't feel like eating". States he usually eats 2 meals per day, no nutrition supplements. Reports UBW of ~190 lbs, unsure of any weight loss but states that he tends to lose and regain weight easily. Per chart review no significant weight loss over the last 1 year. Advised patient to try eating small portions when he does not feel like eating rather than skipping intake altogether, discussed protein foods to assist with wound healing. Patient agreeable to trying Ensure HP during admission, states someone else already told him he would be receiving them on his meal trays but thinks that was possibly at previous hospital.     OBJECTIVE:     Current Diet Order/Nutrition Support:  DIET DIABETIC Calorie amount: CC 2000  MNT PROTOCOL FOR DIETITIAN  ROOM SERVICE:  NEEDS VISIT FOR MENU CHOICES     Height Used for Calculations: 180.3 cm (5' 10.98")  Weight Used For Calculations: 83.6 kg (184 lb 4.9 oz) (7/24 - bed)  BMI (kg/m2): 25.77  BMI Assessment: BMI 25-29.9: overweight  Ideal Body Weight (IBW) (kg): 79.23  % Ideal Body Weight: 105.52              Estimated Needs:    Energy Calorie Requirements: 2350-2750 kcal (28-33 kcal/83.6 kg BW) per day   Protein Requirements (gms/day): 80-95 g (1-1.2 g/79.23 kg IBW) per day   Fluid Requirements: 2100-2500 mL (25-30 mL/83.6 kg BW) per day     Comments: PMH  DM w/ peripheral neuropathy, GERD, and diabetic foot wounds, direct admit from outside hospital for L shoulder osteomyelitis and abscesses 2/2 to Streptococcus sepsis likely from L calcaneal diabetic foot wound/osteomyelitis. Pertinent labs 7/26: K+ 5.3 mmol/L (elevated), creatinine 1.52 mg/dL, Phos 4.5 mg/dL  (elevated, last draw 7/26), POC glucose 144-319 mg/dL x 24 hours.     Plan/Interventions :   Note: Serum proteins (Albumin and Prealbumin) are neither specific, nor sensitive indicators of nutritional status. As negative acute-phase reactants, the concentrations of these proteins are affected by the acute phase response and have been shown to be inversely associated with CRP levels.  Therefore, Albumin and Prealbumin have been found through numerous studies to be poor indicators of nutritional status.  Increases in these markers are more likely due to improvement in clinical status, not nutritional status.      - Continue Diabetic CC diet, if K+ and Phos remain elevated consider updating to Renal diet    - Discussed protein foods for wound healing  - Will order Ensure HP BID to optimize PO intake   - Monitor renal labs w/ goal for most appropriate nutrition interventions in the setting of CKD3: K+ 5.3 mmol/L (elevated), creatinine 1.52 mg/dL, Phos 4.5 mg/dL (elevated, last draw 7/26)  - Monitor POC glucose and provide glycemic control as needed to aid in healing of diabetic foot wounds: 144-319 mg/dL x 24 hours (high)   - Monitor weight weekly to trend, standing if possible for better accuracy   RD will continue to follow     Nutrition Diagnosis: Inadequate protein-energy intake related to poor appetite as evidenced by patient reporting that he sometimes skips meals for days at a time    [employer], RDLD  04/03/2020, 16:49      Pager # [phone]

## 2020-04-03 NOTE — Care Plan (Signed)
[facility]  Physical Therapy Initial Evaluation    Patient Name: [name] [name]  Date of Birth: April 17, 1961  Height: Height: 180.3 cm (5' 10.98")  Weight: Weight: 83.6 kg (184 lb 4.9 oz)  Room/Bed: 04/A  Payor: [place] MEDICARE / Plan: [place] MEDICARE ADVANTAGE PPO / Product Type: PPO /     Assessment:      Pt tolerated session fair.  Transferred OOB to chair with sara stedy.  Able to stand from elevated bed surface, but not able to take steps.  Cues to maintain NWB RUE.  Recomend SNF at d/c.    Discharge Needs:    Equipment Recommendation: TBD    Discharge Disposition: skilled nursing facility  JUSTIFICATION OF DISCHARGE RECOMMENDATION   Based on current diagnosis, functional performance prior to admission, and current functional performance, this patient requires continued PT services in skilled nursing facility in order to achieve significant functional improvements in these deficit areas: aerobic capacity/endurance, gait, locomotion, and balance, integumentary integrity. The above recommendation is based upon the current examination and evaluation performed on this date. As subsequent sessions are completed, recommendations will be updated accordingly.    Plan:   Current Intervention: balance training, bed mobility training, gait training, transfer training  To provide physical therapy services 1x/day, minimum of 3x/week  for duration of until discharge.    The risks/benefits of therapy have been discussed with the patient/caregiver and he/she is in agreement with the established plan of care.     Subjective & Objective      04/03/20 1305   Therapist Pager   PT Assigned/ Pager # [phone] [phone]   Rehab Session   Document Type evaluation   Total PT Minutes: 26   Patient Effort good   Symptoms Noted During/After Treatment none   General Information   Patient Profile Reviewed yes   Onset of Illness/Injury or Date of Surgery 04/01/20   Pertinent History of Current Functional Problem Pt adm with  R shoulder infection.  He is s/p   I&D R shoulder/periscauplar muscles. I/D R proximal humerus with antibiotic bead placement with Dr. Chalmers Guest.   Medical Lines Peripheral Drain;Wound Vac;Telemetry;PIV Line   Respiratory Status room air   Existing Precautions/Restrictions fall precautions;weight bearing restriction   General Observations: Pt reclined in bed.  States he has not been walking much since he fell ~6 weeks ago.   Weight-bearing Status   Extremity Weight-bearing Status right upper extremity   Right Upper Extremity non weight-bearing (NWB)   Mutuality/Individual Preferences   Anxieties, Fears or Concerns none   Individualized Care Needs OOB with sara stedy and assist x 2.   Plan of Care Reviewed With patient   Living Environment   Lives With child(ren), adult   Living Arrangements house   Home Accessibility stairs to enter home   Home Main Entrance   Number of Stairs, Main Entrance two   Functional Level Prior   Ambulation 1 - assistive equipment   Transferring 1 - assistive equipment   Prior Functional Level Comment ambulated with FWW prior to admission, but limited to household distance   Self-Care   Equipment Currently Used at Home yes   Equipment Currently Used at [employer], front wheeled  (rollator)   Equipment Brought to Hospital none   Pre Treatment Status   Pre Treatment Patient Status Patient supine in bed;Call light within reach;Telephone within reach;Sitter select activated   Support Present Pre Treatment  None   Communication Pre Treatment  Nurse  Communication Pre Treatment Comment aware of session   Cognitive Assessment/Interventions   Behavior/Mood Observations alert;cooperative;distractible   Pain Assessment   Pain Location - Side Right   Pain Location - Orientation upper   Pain Location extremity   Pre/Posttreatment Pain Comment did not rate pain   RUE Assessment   RUE Assessment X- Exceptions   RUE ROM NT   RUE Strength NT   LUE Assessment   LUE Assessment WFL- Within Functional Limits   RLE  Assessment   RLE Assessment X-Exceptions   RLE Strength weakness   LLE Assessment   LLE Assessment X-Exceptions   LLE Strength weakness   Bed Mobility Assessment/Treatment   Bed Mobility, Assistive Device Head of Bed Elevated   Supine-Sit Independence independent   Safety Issues decreased use of arms for pushing/pulling   Impairments coordination impaired;endurance;pain;ROM decreased;strength decreased   Transfer Assessment/Treatment   Sit-Stand Independence moderate assist (50% patient effort)   Stand-Sit Independence minimum assist (75% patient effort)   Sit-Stand-Sit, Assist Device [employer]   Bed-Chair Independence dependent (less than 25% patient effort)   Transfer Impairments balance impaired;coordination impaired;endurance;pain;ROM decreased;strength decreased   Balance Skill Training   Sitting Balance: Static good balance   Sitting, Dynamic (Balance) fair + balance   Sit-to-Stand Balance fair - balance   Standing Balance: Static fair - balance   Standing Balance: Dynamic fair - balance   Systems Impairment Contributing to Balance Disturbance neuromuscular;musculoskeletal   Therapeutic Exercise/Activity   Comment ARC brace RUE   Post Treatment Status   Post Treatment Patient Status Patient sitting in bedside chair or w/c;Call light within reach;Telephone within reach;Engineer, petroleum Clinical Associate   Communication Post Treatment Comment aware of performance   Plan of Care Review   Plan Of Care Reviewed With patient   Basic Mobility Am-PAC/6Clicks Score (APPROVED PT Staff and [facility] Nursing ONLY   Turning in bed without bedrails 3   Lying on back to sitting on edge of flat bed 3   Moving to and from a bed to a chair 1   Standing up from chair 2   Walk in room 1   Climbing 3-5 steps with railing 1   6 Clicks Raw Score total 11   Standardized (t-scale) score 30.25   CMS 0-100% Score 66.76   CMS Modifier CL   Patient Mobility Goal  (JHHLM) 4- Move to chair 3X/day   Exercise/Activity Level Performed 5- Static standing>1 minute   Physical Therapy Clinical Impression   Assessment Pt tolerated session fair.  Transferred OOB to chair with sara stedy.  Able to stand from elevated bed surface, but not able to take steps.  Cues to maintain NWB RUE.  Recomend SNF at d/c.   Criteria for Skilled Therapeutic yes;meets criteria   Pathology/Pathophysiology Noted musculoskeletal;endocrine/metabolic   Impairments Found (describe specific impairments) aerobic capacity/endurance;gait, locomotion, and balance;integumentary integrity   Functional Limitations in Following  self-care   Disability: Inability to Perform community/leisure   Rehab Potential good, to achieve stated therapy goals   Therapy Frequency 1x/day;minimum of 3x/week   Predicted Duration of Therapy Intervention (days/wks) until discharge   Anticipated Equipment Needs at Discharge (PT) TBD   Anticipated Discharge Disposition skilled nursing facility   Evaluation Complexity Justification   Patient History: Co-morbidity/factors that impact Plan of Care 1-2 that impact Plan of Care;Surgical procedure: causing pain &/or impaired function;One or more other medical co-morbidity;Complicated prior level of function/decline in  function   Examination Components 4 or more Exam elements addressed;Range of motion;Strength;Balance;Bed mobility;Transfers   Presentation Stable: Uncomplicated, straight-forward, problem focused   Clinical Decision Making Moderate complexity   Evaluation Complexity Moderate complexity   Care Plan Goals   PT Rehab Goals Bed Mobility Goal;Gait Training Goal;Transfer Training Goal   Bed Mobility Goal   Bed Mobility Goal, Date Established 04/03/20   Bed Mobility Goal, Time to Achieve by discharge   Bed Mobility Goal, Activity Type supine to sit/sit to supine   Bed Mobility Goal, Independence Level supervision required   Gait Training  Goal, Distance to Achieve   Gait Training  Goal,  Date Established 04/03/20   Gait Training  Goal, Time to Achieve by discharge   Gait Training  Goal, Independence Level minimum assist (75% patient effort)   Gait Training  Goal, Assist Device least restricted assistive device   Gait Training  Goal, Distance to Achieve 25 feet   Transfer Training Goal   Transfer Training Goal, Date Established 04/03/20   Transfer Training Goal, Time to Achieve by discharge   Transfer Training Goal, Activity Type bed-to-chair/chair-to-bed;sit-to-stand/stand-to-sit;toilet   Transfer Training Goal, Independence Level minimum assist (75% patient effort)   Transfer Training Goal, Assist Device least restrictrictive assistive device   Planned Therapy Interventions, PT Eval   Planned Therapy Interventions (PT) balance training;bed mobility training;gait training;transfer training       Therapist:   Judie Bonus, PT   Pager #: [phone]

## 2020-04-03 NOTE — Consults (Signed)
[facility]  [facility] Advanced Wound Care   Initial Consult    [name] [name], [name] [name], 59 y.o. male  MRN: [id number]  Date of Birth:  19-Sep-1960  Date of service: 04/03/2020  Encounter Start Date: 04/01/2020  Inpatient Admission Date:  04/01/2020  Hospital Day:  LOS: 2 days     Information Obtained from: patient and history reviewed via medical record  Chief Complaint: diabetic foot wound    PCP: [place] By: Hospitalist 37- Dr. Toy Care, [place] Bouchard  04/03/20 0808  IP CONSULT TO ADVANCED WOUND CARE TEAM ONE TIME    Complete   Comments:     Process Instructions: Please call extension [phone] from Monday-Friday 8:00am - 4:00pm with questions/concerns.      References: ON CALL ([place])   Provider: (Not yet assigned)   Question Answer Comment   Reason for consult: OPEN WOUND/CHRONIC WOUND    Open Wound/Chronic Wound Location(s): Diabetic foot wound.  Patient refused podiatry.    Evaluate and: GIVE RECOMMENDATIONS ONLY             HPI:    [name] [name] is a 59 y.o., White male with a past medical history of anxiety, depression, type 2 DM with peripheral neuropathy, and GERD who was admitted to [facility] on 04/01/2020 as a transfer from [facility] for right shoulder osteomyelitis. Advanced wound care consulted on day 2 of his hospital course for a diabetic foot wound. Patient states that he has a wound on his left heel that has been present for over a year. He states that he was seeing a foot/ankle and pain specialist for the wound but stopped going a few months ago. He has been doing wet to dry dressings on the wound. He does have a history of osteomyelitis in the left heel. Patient denies fever or chills, redness/streaking of the wound, malodorous drainage or increased pain.    PAST MEDICAL/ FAMILY/ SOCIAL HISTORY:       Past Medical History:   Diagnosis Date    Anxiety     Depression     Diabetes (CMS HCC)     Diabetes mellitus, type 2 (CMS HCC)     GERD (gastroesophageal  reflux disease)     MRSA (methicillin resistant staph aureus) culture positive 08/06/2019    L eye cellulitis    Osteomyelitis (CMS HCC)     Peripheral neuropathy          No Known Allergies  Medications Prior to Admission       Prescriptions    acetaminophen (TYLENOL) 325 mg Oral Tablet    Take 2 Tabs (650 mg total) by mouth Every 4 hours as needed    buPROPion (WELLBUTRIN SR) 150 mg Oral tablet sustained-release 12 hr    Take 150 mg by mouth Once a day    famotidine (PEPCID) 40 mg Oral Tablet    Take 40 mg by mouth Every evening    gabapentin (NEURONTIN) 600 mg Oral Tablet    Take 1,200 mg by mouth Three times a day    hydrOXYzine pamoate (VISTARIL) 25 mg Oral Capsule    Take 25 mg by mouth Four times a day as needed for Anxiety    insulin glargine 100 unit/mL Subcutaneous injection (vial)    25 Units by Subcutaneous route Every night    oxyCODONE (ROXICODONE) 5 mg Oral Tablet    Take 1 Tab (5 mg total) by mouth Every 8 hours as needed Please take  1-2 tablets every 8 hours for pain as needed.    thiamine (VITAMIN B1) 100 mg Oral Tablet    Take 100 mg by mouth Once a day           buPROPion [facility] SR) sustained release tablet, 150 mg, Oral, Daily  cefepime (MAXIPIME) 2 g in NS 100 mL IVPB, 2 g, Intravenous, Q12H  DAPTOmycin (CUBICIN) 450 mg in NS 50 mL IVPB, 6 mg/kg (Adjusted), Intravenous, Q24H  famotidine (PEPCID) tablet, 40 mg, Oral, QPM  gabapentin (NEURONTIN) capsule, 400 mg, Oral, 2x/day  heparin 5,000 unit/mL injection, 5,000 Units, Subcutaneous, Q8HRS  hydrOXYzine pamoate (VISTARIL) capsule, 25 mg, Oral, 4x/day PRN  insulin glargine 100 units/mL injection, 25 Units, Subcutaneous, NIGHTLY  lactulose (ENULOSE) 20g per 10mL oral liquid, 45 mL, Oral, 3x/day  lidocaine-menthol (LIDOPATCH) 3.6%-1.25% patch, 1 Patch, Transdermal, Daily  metroNIDAZOLE (FLAGYL) tablet, 500 mg, Oral, 3x/day  morphine 4 mg/mL injection, 3 mg, Intravenous, Q2H PRN  NS flush syringe, 2-6 mL, Intracatheter, Q8HRS   And  NS flush  syringe, 2-6 mL, Intracatheter, Q1 MIN PRN  NS flush syringe, 2-6 mL, Intracatheter, Q8HRS   And  NS flush syringe, 2-6 mL, Intracatheter, Q1 MIN PRN  nystatin (NYSTOP) 100,000 units/g topical powder, , Apply Topically, 4x/day  oxyCODONE (ROXICODONE) immediate release tablet, 5 mg, Oral, Q4H PRN   Or  oxyCODONE (ROXICODONE) immediate release tablet, 10 mg, Oral, Q4H PRN  polyethylene glycol (MIRALAX) oral packet, 17 g, Oral, Daily  sennosides-docusate sodium (SENOKOT-S) 8.6-50mg  per tablet, 2 Tablet, Oral, 2x/day  SSIP insulin lispro (HUMALOG) 100 units/mL injection, 0-12 Units, Subcutaneous, 4x/day PRN  thiamine-vitamin B1 (BETAXIN) tablet, 100 mg, Oral, Daily      Past Surgical History:   Procedure Laterality Date    MANDIBLE SURGERY      SHOULDER SURGERY           Family History:    Family Medical History:       Problem Relation (Age of Onset)    Diabetes Mother, Father          Social History     Tobacco Use    Smoking status: Former Smoker     Types: Cigars    Smokeless tobacco: Never Used   Substance Use Topics    Alcohol use: Yes     Comment: Drinks 3-4 beers daily.    Drug use: Never       ROS:  MUST comment on all "Abnormal" findings   Constitutional: negative for fevers, chills, fatigue, and weight loss  Eyes: negative for visual disturbance  ENT: negative for hearing loss and sore throat  Respiratory: negative for cough, wheezing  Cardiovascular: negative for chest pressure/discomfort, no claudication, no lower extremity edema  Gastrointestinal: negative for dysphagia, nausea, vomiting, diarrhea, decrease in appetite   Integumentary: negative for rash, changes in skin color, dryness  Musculoskeletal: negative for myalgias, muscle weakness, joint pain  Neurological: negative for dizziness, no paresthesia/paraplegia  Behavioral/Psych: negative for anxiety, depression  All remaining systems negative.    PHYSICAL EXAMINATION: MUST comment on all "Abnormal" findings    Exam Temperature: 37 C (98.6 F)  Heart  Rate: 84  BP (Non-Invasive): (!) 112/55 (RN notified)  Respiratory Rate: 18  SpO2: 97 %    Constitutional: acutely ill; appears in no acute distress laying in bed  Eyes: PERRL, conjunctiva non-icteric   ENT: mucous membranes moist, trachea midline  Respiratory: non-labored on room air, no cyanosis  Cardiovascular: no edema, 1+ bilateral dorsalis pedis pulses  Neurologic: A&Ox3; grossly intact  Musculoskeletal: wound vac in place with multiple drains on right shoulder; head normocephalic  Psychiatric: Normal affect; cooperative with exam, pleasant  Integumentary: skin warm and dry, open wound on the left heel- see details below, no signs/symptoms of infection, no hemosiderin staining noted, no erythema, no xerosis    Wound #1  Type: Diabetic  Location: left heel  Length: 6.5 cm  Width: 4.2 cm  Depth: 0.2 cm  Undermining/Tunneling: none noted  Wound Base: granulation tissue  Wound Edges: smooth and callused  Drainage Amount: Small amount  Serosanguineous drainage with odor Absent   Periwound: without periwound erythema, crepitus, necrosis or gangrene        Labs Ordered/ Reviewed (Please indicate ordered or reviewed)   Reviewed: Labs:  Lab Results Today:    Results for orders placed or performed during the hospital encounter of 04/01/20 (from the past 24 hour(s))   POC BLOOD GLUCOSE (RESULTS)   Result Value Ref Range    GLUCOSE, POC 245 (H) 70 - 105 mg/dl   POC BLOOD GLUCOSE (RESULTS)   Result Value Ref Range    GLUCOSE, POC 272 (H) 70 - 105 mg/dl   BASIC METABOLIC PANEL, FASTING   Result Value Ref Range    SODIUM 134 (L) 136 - 145 mmol/L    POTASSIUM 5.3 (H) 3.5 - 5.1 mmol/L    CHLORIDE 109 96 - 111 mmol/L    CO2 TOTAL 21 (L) 23 - 31 mmol/L    ANION GAP 4 4 - 13 mmol/L    CALCIUM 8.3 (L) 8.8 - 10.2 mg/dL    GLUCOSE 312 (H) 70 - 99 mg/dL    BUN 26 (H) 8 - 25 mg/dL    CREATININE 1.52 (H) 0.75 - 1.35 mg/dL    BUN/CREA RATIO 17 6 - 22    ESTIMATED GFR 49 (L) >=60 mL/min/BSA   MAGNESIUM   Result Value Ref Range     MAGNESIUM 2.1 1.8 - 2.6 mg/dL   CREATINE KINASE (CK), TOTAL, SERUM   Result Value Ref Range    CREATINE KINASE 97 45 - 225 U/L   C-REACTIVE PROTEIN(CRP),INFLAMMATION   Result Value Ref Range    CRP INFLAMMATION 72.9 (H) <8.0 mg/L   CBC WITH DIFF   Result Value Ref Range    WBC 13.0 (H) 3.7 - 11.0 x103/uL    RBC 2.72 (L) 4.50 - 6.10 x106/uL    HGB 8.0 (L) 13.4 - 17.5 g/dL    HCT 25.1 (L) 38.9 - 52.0 %    MCV 92.3 78.0 - 100.0 fL    MCH 29.4 26.0 - 32.0 pg    MCHC 31.9 31.0 - 35.5 g/dL    RDW-CV 15.0 11.5 - 15.5 %    PLATELETS 268 150 - 400 x103/uL    MPV 9.9 8.7 - 12.5 fL    NEUTROPHIL % 87 %    LYMPHOCYTE % 6 %    MONOCYTE % 6 %    EOSINOPHIL % 0 %    BASOPHIL % 0 %    NEUTROPHIL # 11.41 (H) 1.50 - 7.70 x103/uL    LYMPHOCYTE # 0.74 (L) 1.00 - 4.80 x103/uL    MONOCYTE # 0.71 0.20 - 1.10 x103/uL    EOSINOPHIL # <0.10 <=0.50 x103/uL    BASOPHIL # <0.10 <=0.20 x103/uL    IMMATURE GRANULOCYTE % 1 0 - 1 %    IMMATURE GRANULOCYTE # 0.13 (H) <0.10 x103/uL   POC BLOOD GLUCOSE (RESULTS)  Result Value Ref Range    GLUCOSE, POC 319 (H) 70 - 105 mg/dl   POC BLOOD GLUCOSE (RESULTS)   Result Value Ref Range    GLUCOSE, POC 270 (H) 70 - 105 mg/dl       ALBUMIN   Date Value   04/01/2020 1.8 g/dL  (L)   08/12/2019 2.1 g/dL (L)     HEMOGLOBIN A1C (%)   Date Value   04/01/2020 8.5 (H)   08/11/2019 8.8 (H)       Radiology Tests Ordered/ Reviewed (Please indicate ordered or reviewed)   Reviewed: I have reviewed all radiology results for this encounter    Assessment/Plan   59 year old male with anxiety, depression, type 2 DM with peripheral neuropathy, and GERD admitted for right shoulder osteomyelitis. Advanced wound care consulted on day 2 of his hospital course for a diabetic foot wound.    Diabetic foot ulcer left heel  -Clean wound with wound cleanser then break off pieces of puracol collagen and apply to wound bed. Moisten with a few drops of normal saline then cover with a heel border dressing. Change daily.  -waffle  boots to offload heels  -wound has increased in size since his prior admission on 08/12/2019  -already being treated for ostemyelitis. Management per infectious disease    Protein malnutrition  -Albumin 1.8 on 04/01/2020  -Recommend nutrition services consult for protein malnutrition    Type 2 DM  -HA1C Level 8.5 on 04/01/2020  -Tight glycemic control. Management per primary team    -Case management for home health needs and discharge planning  -Follow up should be scheduled upon discharge with the [facility] for [place] [phone] or a wound care center closer to his place of residence.    This was a recommendations only consult, so no orders were placed.    -We will continue to follow patient on a weekly basis. Please call if there are any questions/concerns prior to follow up.    Lana Dhamija, PA-C  04/03/2020, 11:08  [facility] Advanced [facility]  Ext: [phone]      The patient was seen independently by the APP  Unless directly noted, I did not evaluate the patient but was accessible by phone.  Signature required by employer    Roslyn Smiling, MD

## 2020-04-03 NOTE — Ancillary Notes (Addendum)
Diabetes Education    Stopped to offer diabetes education. The patient states that he has had diabetes for about 20 years.     The patient states that he takes "insulin, 25 units at night" but could not recall the name of his medication. Per EMR patient is prescribed Glargine. The patient verbalized compliance with medication regimen.     The patient stated that he tests 3 times a day and has a meter. The patient states that he ranges in the 300-400 mg/dL. We reviewed hypoglycemia; denies episodes.     Current A1c   Lab Results   Component Value Date    HA1C 8.5 (H) 04/01/2020     Reviewed value and meaning. Discussed ADA targets.     The patient denies issues with obtaining testing supplies or medications. We reviewed progression of disease, ADA BG targets and the importance of testing and avoidance of complications by managing blood sugars.     The patient shared that he follows with [facility] for diabetes. Answered all questions. Verbalized understanding. Contact information offered.    Patient inquired about meal assistance at home. Information relayed to care management.     Hillery Hunter BSN, RN, [place]  Ext: [phone]  Pager: [phone]

## 2020-04-04 DIAGNOSIS — E1140 Type 2 diabetes mellitus with diabetic neuropathy, unspecified: Secondary | ICD-10-CM

## 2020-04-04 LAB — CBC WITH DIFF
BASOPHIL #: <0.1 10*3/uL (ref <=0.20)
BASOPHIL %: 1 %
EOSINOPHIL #: <0.1 10*3/uL (ref <=0.50)
EOSINOPHIL %: 1 %
HCT: 24.4 % — ABNORMAL LOW (ref 38.9–52.0)
HGB: 7.8 g/dL — ABNORMAL LOW (ref 13.4–17.5)
IMMATURE GRANULOCYTE #: 0.16 10*3/uL — ABNORMAL HIGH (ref <0.10)
IMMATURE GRANULOCYTE %: 1 % (ref 0–1)
LYMPHOCYTE #: 1.6 10*3/uL (ref 1.00–4.80)
LYMPHOCYTE %: 14 %
MCH: 29.9 pg (ref 26.0–32.0)
MCHC: 32 g/dL (ref 31.0–35.5)
MCV: 93.5 fL (ref 78.0–100.0)
MONOCYTE #: 0.78 10*3/uL (ref 0.20–1.10)
MONOCYTE %: 7 %
MPV: 9.9 fL (ref 8.7–12.5)
NEUTROPHIL #: 8.64 10*3/uL — ABNORMAL HIGH (ref 1.50–7.70)
NEUTROPHIL %: 76 %
PLATELETS: 282 10*3/uL (ref 150–400)
RBC: 2.61 10*6/uL — ABNORMAL LOW (ref 4.50–6.10)
RDW-CV: 15.1 % (ref 11.5–15.5)
WBC: 11.3 10*3/uL — ABNORMAL HIGH (ref 3.7–11.0)

## 2020-04-04 LAB — SURGICAL PATHOLOGY SPECIMEN

## 2020-04-04 LAB — BASIC METABOLIC PANEL, FASTING
ANION GAP: 5 mmol/L (ref 4–13)
BUN/CREA RATIO: 15 (ref 6–22)
BUN: 22 mg/dL (ref 8–25)
CALCIUM: 8.6 mg/dL — ABNORMAL LOW (ref 8.8–10.2)
CHLORIDE: 112 mmol/L — ABNORMAL HIGH (ref 96–111)
CO2 TOTAL: 23 mmol/L (ref 23–31)
CREATININE: 1.49 mg/dL — ABNORMAL HIGH (ref 0.75–1.35)
ESTIMATED GFR: 50 mL/min/BSA — ABNORMAL LOW (ref >=60)
GLUCOSE: 118 mg/dL — ABNORMAL HIGH (ref 70–99)
POTASSIUM: 4.4 mmol/L (ref 3.5–5.1)
SODIUM: 140 mmol/L (ref 136–145)

## 2020-04-04 LAB — C-REACTIVE PROTEIN(CRP),INFLAMMATION: CRP INFLAMMATION: 45.7 mg/L — ABNORMAL HIGH (ref <8.0)

## 2020-04-04 LAB — POC BLOOD GLUCOSE (RESULTS)
GLUCOSE, POC: 122 mg/dL — ABNORMAL HIGH (ref 70–105)
GLUCOSE, POC: 133 mg/dL — ABNORMAL HIGH (ref 70–105)
GLUCOSE, POC: 119 mg/dL — ABNORMAL HIGH (ref 70–105)
GLUCOSE, POC: 155 mg/dL — ABNORMAL HIGH (ref 70–105)

## 2020-04-04 LAB — MAGNESIUM: MAGNESIUM: 2 mg/dL (ref 1.8–2.6)

## 2020-04-04 MED ORDER — ALTEPLASE 1 MG/2 ML SYRINGE
1.0000 mg | INJECTION | INTRAMUSCULAR | Status: DC
Start: 2020-04-04 — End: 2020-04-10
  Administered 2020-04-04: 0 mg
  Filled 2020-04-04: qty 2

## 2020-04-04 NOTE — Care Plan (Signed)
[place]  Occupational Therapy Initial Evaluation    Patient Name: [name] [name]  Date of Birth: [birth year]  Height: Height: 180.3 cm (5' 10.98")  Weight: Weight: 83.6 kg (184 lb 4.9 oz)  Room/Bed: 04/A  Payor: [place] MEDICARE / Plan: [place] MEDICARE ADVANTAGE PPO / Product Type: PPO /     Assessment:   [name] [name] performed adequately during OT evaluation this date. Pt presents POD 2 s/p I&D R shoulder. Pt had a fall ~6 weeks ago and has primarily been non-ambulatory and requiring increased assistance with his ADL routine per family. Pt completed functional bed mobility with CGA provided for safety and good compliance with NWB RUE in ARC brace. He required dep A to adjust non-skid socks secondary to pain and decreased flexibility. Following arrangement of wound vac, 2 hemovacs drains, and PIV line, pt required max A to perform standing transfers from EOB with inability shift weight towards bedside recliner chair, requiring several attempts to return to standing and max-dep A to transfer towards L side. From OT perspective, at this time recommend SNF placement upon d/c once medically appropriate to maximize functional recovery and independence prior to returning home. OT will continue to follow.      Discharge Needs:   Equipment Recommendation: to be determined    Discharge Disposition: skilled nursing facility    JUSTIFICATION OF DISCHARGE RECOMMENDATION   Based on current diagnosis, functional performance prior to admission, and current functional performance, this patient requires continued OT services in skilled nursing facility  in order to achieve significant functional improvements.  The above recommendation is based upon the current examination and evaluation performed on this date. As subsequent sessions are completed, recommendations will be updated accordingly.    Plan:   Current Intervention: ADL retraining, IADL retraining, balance training, bed mobility training, endurance  training, strengthening, transfer training    To provide Occupational therapy services 1x/day, minimum of 2x/week, until discharge, until goals are met.     The risks/benefits of therapy have been discussed with the patient/caregiver and he/she is in agreement with the established plan of care.       Subjective & Objective        04/04/20 1410   Therapist Pager   OT Assigned/ Pager # Octavis Sheeler [phone]   Rehab Session   Document Type evaluation   Total OT Minutes: 21   Patient Effort adequate   Symptoms Noted During/After Treatment increased pain   General Information   Patient Profile Reviewed yes   Onset of Illness/Injury or Date of Surgery 04/01/20   Pertinent History of Current Functional Problem [name] [name] is a 59 y.o., white male with a history of anxiety, depression, diabetes with peripheral neuropathy, GERD, and diabetic foot wounds admitted as a direct admission from [facility] for 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 POD 2 s/p I&D R shoulder.   Medical Lines PIV Line;Telemetry;Wound Vac;Peripheral Drain  (Hemovac x2)   Respiratory Status room air   Existing Precautions/Restrictions full code;fall precautions;weight bearing restriction  (NWB RUE)   Pre Treatment Status   Pre Treatment Patient Status Patient supine in bed;Call light within reach;Telephone within reach;Sitter select activated;Nurse approved session   Support Present Pre Treatment  Nurse present   Communication Pre Treatment  Nurse   Mutuality/Individual Preferences   Individualized Care Needs OOB to chair via Denna Haggard and assist x2; NWB RUE   Patient-Specific Goals (Include Timeframe) Pain  control   Plan of Care Reviewed With patient   Living Environment   Lives With child(ren), adult   Living Arrangements house   Home Assessment: No Problems Identified   Home Accessibility stairs to enter home   [place] Pt  reports living with his adult daughter in a single level home with 2 STE.   Home Main Entrance   Number of Stairs, Main Entrance two   Functional Level Prior   Ambulation 1 - assistive equipment   Transferring 1 - assistive equipment   Toileting 1 - assistive equipment   Bathing 2 - assistive person   Dressing 2 - assistive person   Eating 0 - independent   Communication 0 - understands/communicates without difficulty   Prior Functional Level Comment Pt reports he has been using a FWW for mobility within the home for household distances. Uses a BSC for toileting. Requires assistance from his daughter for sponge bathing and dressing tasks.   Self-Care   Equipment Currently Used at Home commode;walker, rolling   Vital Signs   O2 Delivery Pre Treatment room air   O2 Delivery Post Treatment room air   Pain Assessment   Pre/Posttreatment Pain Comment Pt indicating 9/10 pain of R shoulder.   Coping/Psychosocial   Observed Emotional State calm;cooperative   Verbalized Emotional State acceptance   Coping/Psychosocial Response Interventions   Plan Of Care Reviewed With patient   Cognitive Assessment/Interventions   Behavior/Mood Observations alert;cooperative;distractible   Orientation Status oriented x 3   Attention mild impairment;distractible   Follows Commands follows one step commands;physical/tactile prompts required;verbal cues/prompting required   RUE Assessment   RUE ROM limited by wound vac placement and ARC brace   RUE Strength NWB RUE   LUE Assessment   LUE Assessment WFL for stated baseline   Mobility Assessment/Training   Mobility Comment Not appropriate to assess.   Right Upper Extremity non weight-bearing (NWB)   Bed Mobility Assessment/Treatment   Supine-Sit Independence stand-by assistance   Sit to Supine, Independence not tested   Safety Issues decreased use of arms for pushing/pulling   Impairments coordination impaired;pain;ROM decreased;strength decreased   Transfer Assessment/Treatment   Sit-Stand  Independence maximum assist (25% patient effort)   Stand-Sit Independence maximum assist (25% patient effort)   Sit-Stand-Sit, Assist Device handheld assist   Bed-Chair Independence maximum assist (25% patient effort);verbal cues required   Bed-Chair-Bed Assist Device handheld assist   Transfer Impairments balance impaired;coordination impaired;endurance;flexibility decreased;pain;ROM decreased;strength decreased   ADL Assessment/Intervention   ADL Comments ADL participation limited by worsening R shoulder pain and multiple medical lines. Unable to reach feet to adjust non-skid socks prior to OOB activity. Demonstrate adequate ROM/strength of LUE to perform UB grooming ADLs in seated position with setup A. Will continue to assess as appropriate.   Motor Skills/Interventions   Additional Documentation Functional Endurance (Group)   Information systems manager   Comment with max A   Sitting Balance: Static good balance   Sitting, Dynamic (Balance) good balance   Sit-to-Stand Balance poor balance   Standing Balance: Static poor balance   Standing Balance: Dynamic poor balance   Systems Impairment Contributing to Balance Disturbance neuromuscular;musculoskeletal   Identified Impairments Contributing to Balance Disturbance decreased ROM;decreased strength;pain   Functional Endurance Training   Comment, Functional Endurance poor   Orthotics/Misc Device    Shoulder Brace On;ARC Shoulder Brace   Post Treatment Status   Post Treatment Patient Status Patient sitting in bedside chair or w/c;Call light within reach;Telephone within reach;Sitter select activated  Support Present Post Treatment  None   Care Plan Goals   OT Rehab Goals Bed Mobility Goal;Grooming Goal;Toileting Goal;Transfer Training Goal   Bed Mobility Goal   Bed Mobility Goal, Date Established 04/04/20   Bed Mobility Goal, Time to Achieve by discharge   Bed Mobility Goal, Activity Type all bed mobility activities   Bed Mobility Goal, Independence Level supervision  required   Bed Mobility Goal, Additional Goal maintain NWB   Grooming Goal   Grooming Goal, Date Established 04/04/20   Grooming Goal, Time to Achieve by discharge   Grooming Goal, Activity Type all grooming tasks   Grooming Goal, Independence  supervision required   Grooming Goal, Position sitting, edge of bed   Grooming Goal, Additional Goal seated grooming routine   Toileting Goal   Toileting Goal, Date Established 04/04/20   Toileting Goal, Time to Achieve by discharge   Toileting Goal, Activity Type all toileting tasks   Toileting Goal, Independence Level minimum assist (75% patient effort)   Transfer Training Goal   Transfer Training Goal, Date Established 04/04/20   Transfer Training Goal, Time to Achieve by discharge   Transfer Training Goal, Activity Type bed-to-chair/chair-to-bed;sit-to-stand/stand-to-sit   Transfer Training Goal, Independence Level minimum assist (75% patient effort)   Transfer Training Goal, Assist Device least restrictrictive assistive device   Planned Therapy Interventions, OT Eval   Planned Therapy Interventions ADL retraining;IADL retraining;balance training;bed mobility training;endurance training;strengthening;transfer training   Clinical Impression   Functional Level at Time of Session [name] [name] performed adequately during OT evaluation this date. Pt presents POD 2 s/p I&D R shoulder. Pt had a fall ~6 weeks ago and has primarily been non-ambulatory and requiring increased assistance with his ADL routine per family. Pt completed functional bed mobility with CGA provided for safety and good compliance with NWB RUE in ARC brace. He required dep A to adjust non-skid socks secondary to pain and decreased flexibility. Following arrangement of wound vac, 2 hemovacs drains, and PIV line, pt required max A to perform standing transfers from EOB with inability shift weight towards bedside recliner chair, requiring several attempts to return to standing and max-dep A to transfer towards L  side. From OT perspective, at this time recommend SNF placement upon d/c once medically appropriate to maximize functional recovery and independence prior to returning home. OT will continue to follow.   Criteria for Skilled Therapeutic Interventions Met (OT) yes;meets criteria;skilled treatment is necessary   Rehab Potential good, to achieve stated therapy goals   Therapy Frequency 1x/day;minimum of 2x/week   Predicted Duration of Therapy until discharge;until goals are met   Anticipated Equipment Needs at Discharge to be determined   Anticipated Discharge Disposition skilled nursing facility   Evaluation Complexity Justification   Occupational Profile Review Expanded review   Performance Deficits 3-5 deficits;Pain;Mobility;Range of motion;Strength;Sequencing   Clinical Decision Making Moderate analytic complexity   Evaluation Complexity Moderate       Therapist:   Graeme Menees, MOT, OTR/L   Pager #: [phone]

## 2020-04-04 NOTE — Consults (Signed)
INFECTIOUS DISEASE INITIAL CONSULTATION    Patient Name: [name] [name] Number: [id number]  Date of Service: 04/04/2020  Date of Birth: March 31, 1960    Hospital Day:  LOS: 3 days     Requesting Service: HOSPITALIST 11  Requesting Physician: Randie Heinz, DO    Subjective: He complains right shoulder pain 9/10. Wound vac in place. He doesn't want his pain medication to be cut down. He denies any fever or chills. He has no chest pain, shortness of breath, or cough. He denies any nausea, vomiting, and diarrhea. He is tolerating his antibiotics well. He has no fever or chills. He does have lower leg and upper extremity neuropathy.     Information Source: primary care team and electronic medical record    Reason for Consultation: Right shoulder infection         Past Medical History:  Past Medical History:   Diagnosis Date    Anxiety     Depression     Diabetes (CMS HCC)     Diabetes mellitus, type 2 (CMS HCC)     GERD (gastroesophageal reflux disease)     MRSA (methicillin resistant staph aureus) culture positive 08/06/2019    L eye cellulitis    Osteomyelitis (CMS HCC)     Peripheral neuropathy          Past Surgical History:  Past Surgical History:   Procedure Laterality Date    MANDIBLE SURGERY      SHOULDER SURGERY           Family History:  Family Medical History:     Problem Relation (Age of Onset)    Diabetes Mother, Father            Allergies:  No Known Allergies    Inpatient Medications:  alteplase (ACTIVASE) 1 mg/2 mL for CATHETER OCCLUSION, 1 mg, Intracatheter, Now  buPROPion [facility] SR) sustained release tablet, 150 mg, Oral, Daily  cefepime (MAXIPIME) 2 g in NS 100 mL IVPB, 2 g, Intravenous, Q12H  DAPTOmycin (CUBICIN) 450 mg in NS 50 mL IVPB, 6 mg/kg (Adjusted), Intravenous, Q24H  famotidine (PEPCID) tablet, 40 mg, Oral, QPM  gabapentin (NEURONTIN) capsule, 400 mg, Oral, 2x/day  heparin 5,000 unit/mL injection, 5,000 Units, Subcutaneous, Q8HRS  hydrOXYzine pamoate (VISTARIL)  capsule, 25 mg, Oral, 4x/day PRN  insulin glargine 100 units/mL injection, 25 Units, Subcutaneous, NIGHTLY  insulin lispro (HUMALOG) 100 units/mL injection, 3 Units, Subcutaneous, 3x/day AC  lactulose (ENULOSE) 20g per 89mL oral liquid, 45 mL, Oral, 3x/day  lidocaine-menthol (LIDOPATCH) 3.6%-1.25% patch, 1 Patch, Transdermal, Daily  metroNIDAZOLE (FLAGYL) tablet, 500 mg, Oral, 3x/day  NS flush syringe, 2-6 mL, Intracatheter, Q8HRS   And  NS flush syringe, 2-6 mL, Intracatheter, Q1 MIN PRN  NS flush syringe, 2-6 mL, Intracatheter, Q8HRS   And  NS flush syringe, 2-6 mL, Intracatheter, Q1 MIN PRN  nystatin (NYSTOP) 100,000 units/g topical powder, , Apply Topically, 4x/day  oxyCODONE (ROXICODONE) immediate release tablet, 5 mg, Oral, Q4H PRN   Or  oxyCODONE (ROXICODONE) immediate release tablet, 10 mg, Oral, Q4H PRN  polyethylene glycol (MIRALAX) oral packet, 17 g, Oral, Daily  sennosides-docusate sodium (SENOKOT-S) 8.6-50mg  per tablet, 2 Tablet, Oral, 2x/day  SSIP insulin lispro (HUMALOG) 100 units/mL injection, 0-12 Units, Subcutaneous, 4x/day PRN  thiamine-vitamin B1 (BETAXIN) tablet, 100 mg, Oral, Daily        Current Antimicrobials:  Antibiotics (last 24 hours)     Date/Time Action Medication Dose Rate    04/04/20 0849 Given  metroNIDAZOLE (FLAGYL) tablet 500 mg     04/04/20 0143 New Bag/New Syringe    cefepime (MAXIPIME) 2 g in NS 100 mL IVPB 2 g 33 mL/hr    04/03/20 2042 Given    metroNIDAZOLE (FLAGYL) tablet 500 mg     04/03/20 1429 New Bag/New Syringe    cefepime (MAXIPIME) 2 g in NS 100 mL IVPB 2 g 33 mL/hr    04/03/20 1428 Given    metroNIDAZOLE (FLAGYL) tablet 500 mg     04/03/20 1427 New Bag/New Syringe    DAPTOmycin (CUBICIN) 450 mg in NS 50 mL IVPB 450 mg 118 mL/hr          Lines:  Patient Lines/Drains/Airways Status    Active Line / Dialysis Catheter / Dialysis Graft / Drain / Airway / Wound     Name: Placement date: Placement time: Site: Days:    Peripheral IV Mid;Left Forearm  04/02/20   0754   2     PICC Double Lumen Left;Basilic Vein  --   --   --      Hemovac Lateral;Right;Upper Back  04/02/20   0912   2    Hemovac Right;Upper;Medial Back  --   --       Wound Vac Right Shoulder  04/02/20   --   2    Wound (Non-Surgical) Left Heel  08/11/19   --   237    Wound (Non-Surgical) Right Heel  08/10/19   --   238    Wound (Non-Surgical) Left Eye  08/11/19   --   237    Surgical Incision Lateral;Right Shoulder  04/02/20   --   2                  Physical Exam:  Vitals: BP (!) 149/78 Comment: RN notified   Pulse 83    Temp 37 C (98.6 F)    Resp 18    Ht 1.803 m (5' 10.98")    Wt 83.6 kg (184 lb 4.9 oz)    SpO2 94%    BMI 25.72 kg/m     General: No acute distress.  Non-toxic appearing.  Eyes: Pupils equal round and reactive bilaterally.  Extraocular muscles intact.  No conjunctival injection or scleral icterus.  ENT: Face symmetrical.  Mucous membranes pink and moist.  No oral candidiasis or mucosal ulcers.  Neck: Supple and symmetrical.  No cervical lymphadenopathy.  Lungs: Clear to auscultation bilaterally.  No wheezes, rhonchi, or crackles.  Unlabored respirations.  Cardiovascular: Regular rate and rhythm.  No murmur.  Abdomen: Normoactive bowel sounds.  Nondistended, soft, and nontender to palpation.  No hepatosplenomegaly.  Extremities: No joint erythema or swelling.  No cyanosis or edema.  MSK: left heel ulcer. Right shoulder wound vac.   Skin: Warm and dry.  No rash or lesions.  Neurologic: Alert and oriented X 3.  Cranial nerves II-XII are intact.  Moves all extremities without focal deficit. No sensations below mid tibia  Psychiatric: Appropriate affect and behavior.  Intact memory.  Fluent speech.       Results for orders placed or performed during the hospital encounter of 04/01/20 (from the past 24 hour(s))   CBC/DIFF    Narrative    The following orders were created for panel order CBC/DIFF.  Procedure                               Abnormality  Status                     ---------                                -----------         ------                     CBC WITH TDHR[[id number]]                Abnormal            Final result                 Please view results for these tests on the individual orders.   BASIC METABOLIC PANEL, FASTING   Result Value Ref Range    SODIUM 140 136 - 145 mmol/L    POTASSIUM 4.4 3.5 - 5.1 mmol/L    CHLORIDE 112 (H) 96 - 111 mmol/L    CO2 TOTAL 23 23 - 31 mmol/L    ANION GAP 5 4 - 13 mmol/L    CALCIUM 8.6 (L) 8.8 - 10.2 mg/dL    GLUCOSE 118 (H) 70 - 99 mg/dL    BUN 22 8 - 25 mg/dL    CREATININE 1.49 (H) 0.75 - 1.35 mg/dL    BUN/CREA RATIO 15 6 - 22    ESTIMATED GFR 50 (L) >=60 mL/min/BSA   MAGNESIUM   Result Value Ref Range    MAGNESIUM 2.0 1.8 - 2.6 mg/dL   C-REACTIVE PROTEIN(CRP),INFLAMMATION   Result Value Ref Range    CRP INFLAMMATION 45.7 (H) <8.0 mg/L   CBC WITH DIFF   Result Value Ref Range    WBC 11.3 (H) 3.7 - 11.0 x103/uL    RBC 2.61 (L) 4.50 - 6.10 x106/uL    HGB 7.8 (L) 13.4 - 17.5 g/dL    HCT 24.4 (L) 38.9 - 52.0 %    MCV 93.5 78.0 - 100.0 fL    MCH 29.9 26.0 - 32.0 pg    MCHC 32.0 31.0 - 35.5 g/dL    RDW-CV 15.1 11.5 - 15.5 %    PLATELETS 282 150 - 400 x103/uL    MPV 9.9 8.7 - 12.5 fL    NEUTROPHIL % 76 %    LYMPHOCYTE % 14 %    MONOCYTE % 7 %    EOSINOPHIL % 1 %    BASOPHIL % 1 %    NEUTROPHIL # 8.64 (H) 1.50 - 7.70 x103/uL    LYMPHOCYTE # 1.60 1.00 - 4.80 x103/uL    MONOCYTE # 0.78 0.20 - 1.10 x103/uL    EOSINOPHIL # <0.10 <=0.50 x103/uL    BASOPHIL # <0.10 <=0.20 x103/uL    IMMATURE GRANULOCYTE % 1 0 - 1 %    IMMATURE GRANULOCYTE # 0.16 (H) <0.10 x103/uL   POC BLOOD GLUCOSE (RESULTS)   Result Value Ref Range    GLUCOSE, POC 241 (H) 70 - 105 mg/dl   POC BLOOD GLUCOSE (RESULTS)   Result Value Ref Range    GLUCOSE, POC 138 (H) 70 - 105 mg/dl   POC BLOOD GLUCOSE (RESULTS)   Result Value Ref Range    GLUCOSE, POC 122 (H) 70 - 105 mg/dl   POC BLOOD GLUCOSE (RESULTS)   Result Value Ref Range    GLUCOSE, POC 133 (H) 70 - 105 mg/dl  Microbiology:  Hospital Encounter on 04/01/20 (from the past 96 hour(s))   BLOOD CULTURE (DETERMINE LINE SEPSIS)    Collection Time: 04/01/20  2:19 PM    Specimen: Blood   Culture Result Status    BLOOD CULTURE, LINE SEPSIS Abnormal Stain (AA) Preliminary    BLOOD CULTURE, LINE SEPSIS (A) Preliminary     Staphylococcus epidermidis, Coagulase negative Staphylococcus    GRAM STAIN Gram Positive Cocci/Clusters (A) Preliminary    Narrative    Time to positivity: 23 hours 31 minutes  Improper specimen collection precludes interpretation. Relationship of catheter to source of bacteremia cannot be determined based on time to positivity.     MRSA/MSSA MOLECULAR SCREEN, BLOOD    Collection Time: 04/01/20  2:19 PM    Specimen: Blood   Culture Result Status    METHICILLIN RESISTANCE Negative Final    STAPHYLOCOCCUS AUREUS Negative Final    Narrative    INTERPRETATION: No Staphylococcus aureus identified    Preliminary molecular testing did NOT detect genetic markers for Staph aureus (spa) or associated methicillin resistance (mecA and SCCmec).  Standard isolate identification and antimicrobial susceptibility testing results are forthcoming.     Methicillin resistance/susceptibility in organisms other than Staphylococcus aureus CANNOT be determined by this assay.    Performed by PCR using the MRSA/SA Blood Culture Assay and the GeneXpert system. Test performance on this bottle type verified at [facility] using an FDA cleared assay. Results are preliminary and should be interpreted within the clinical context.   BLOOD CULTURE (DETERMINE LINE SEPSIS)    Collection Time: 04/01/20  3:46 PM    Specimen: Blood   Culture Result Status    BLOOD CULTURE, LINE SEPSIS No Growth 2 Days Preliminary   FUNGUS CULTURE    Collection Time: 04/02/20  9:09 AM    Specimen: Tissue   Culture Result Status    FUNGAL CULTURE No Growth 2 Days Preliminary   FUNGUS CULTURE    Collection Time: 04/02/20  9:09 AM    Specimen: Tissue   Culture Result Status     FUNGAL CULTURE No Growth 2 Days Preliminary   FUNGUS CULTURE    Collection Time: 04/02/20  9:09 AM    Specimen: Tissue   Culture Result Status    FUNGAL CULTURE No Growth 2 Days Preliminary   AFB CULTURE WITH STAIN    Collection Time: 04/02/20  9:09 AM    Specimen: Tissue   Culture Result Status    AFB SMEAR Auramine concentrated smear:  Negative Preliminary   AFB CULTURE WITH STAIN    Collection Time: 04/02/20  9:09 AM    Specimen: Tissue   Culture Result Status    AFB SMEAR Auramine concentrated smear:  Negative Preliminary   AFB CULTURE WITH STAIN    Collection Time: 04/02/20  9:09 AM    Specimen: Tissue   Culture Result Status    AFB SMEAR Auramine concentrated smear:  Negative Preliminary   OR CULTURE(AEROBIC AND GRAM STAIN)    Collection Time: 04/02/20  9:09 AM    Specimen: Tissue   Culture Result Status    FLC No Growth 2 Days Preliminary    GRAM STAIN 3+ Several PMNs Preliminary    GRAM STAIN No Organisms Seen Preliminary   OR CULTURE(AEROBIC AND GRAM STAIN)    Collection Time: 04/02/20  9:09 AM    Specimen: Tissue   Culture Result Status    FLC No Growth 2 Days Preliminary    GRAM STAIN 4+ Many PMNs Preliminary  GRAM STAIN No Organisms Seen Preliminary   OR CULTURE(AEROBIC AND GRAM STAIN)    Collection Time: 04/02/20  9:09 AM    Specimen: Tissue   Culture Result Status    FLC No Growth 2 Days Preliminary    GRAM STAIN 3+ Several PMNs Preliminary    GRAM STAIN No Organisms Seen Preliminary   FUNGUS CULTURE    Collection Time: 04/02/20  9:26 AM    Specimen: Tissue   Culture Result Status    FUNGAL CULTURE No Growth 2 Days Preliminary   FUNGUS CULTURE    Collection Time: 04/02/20  9:26 AM    Specimen: Tissue   Culture Result Status    FUNGAL CULTURE No Growth 2 Days Preliminary   AFB CULTURE WITH STAIN    Collection Time: 04/02/20  9:26 AM    Specimen: Tissue   Culture Result Status    AFB SMEAR Auramine concentrated smear:  Negative Preliminary   AFB CULTURE WITH STAIN    Collection Time: 04/02/20  9:26  AM    Specimen: Tissue   Culture Result Status    AFB SMEAR Auramine concentrated smear:  Negative Preliminary   OR CULTURE(AEROBIC AND GRAM STAIN)    Collection Time: 04/02/20  9:26 AM    Specimen: Tissue   Culture Result Status    FLC No Growth 2 Days Preliminary    GRAM STAIN 3+ Several PMNs Preliminary    GRAM STAIN No Organisms Seen Preliminary   OR CULTURE(AEROBIC AND GRAM STAIN)    Collection Time: 04/02/20  9:26 AM    Specimen: Tissue   Culture Result Status    FLC No Growth 2 Days Preliminary    GRAM STAIN 3+ Several PMNs Preliminary    GRAM STAIN No Organisms Seen Preliminary   FUNGUS CULTURE    Collection Time: 04/02/20  9:33 AM    Specimen: Tissue   Culture Result Status    FUNGAL CULTURE No Growth 2 Days Preliminary   AFB CULTURE WITH STAIN    Collection Time: 04/02/20  9:33 AM    Specimen: Tissue   Culture Result Status    AFB SMEAR Auramine concentrated smear:  Negative Preliminary   OR CULTURE(AEROBIC AND GRAM STAIN)    Collection Time: 04/02/20  9:33 AM    Specimen: Tissue   Culture Result Status    FLC No Growth 2 Days Preliminary    GRAM STAIN 3+ Several PMNs Preliminary    GRAM STAIN No Organisms Seen Preliminary   BLOOD CULTURE (DETERMINE LINE SEPSIS)    Collection Time: 04/02/20  2:46 PM    Specimen: Blood   Culture Result Status    BLOOD CULTURE, LINE SEPSIS No Growth 18-24 hrs. Preliminary   BLOOD CULTURE (DETERMINE LINE SEPSIS)    Collection Time: 04/02/20  2:47 PM    Specimen: Blood   Culture Result Status    BLOOD CULTURE, LINE SEPSIS No Growth 18-24 hrs. Preliminary      Imaging Studies:  Results for orders placed or performed during the hospital encounter of 04/01/20 (from the past 72 hour(s))   XR AP MOBILE CHEST     Status: None    Narrative    XR AP MOBILE CHEST performed on 04/01/2020 1:27 PM    INDICATION: 59 years old Male; Clear PICC    TECHNIQUE: 1 views of the chest; 1 images    COMPARISON: Chest radiograph 08/13/2019.    FINDINGS: No focal consolidation, pleural effusion or  pneumothorax. The  heart is stable  in size without pulmonary edema. Osseous structures are  intact. A left PICC is noted with the tip projecting over the cavoatrial  junction. Tubing projects over the right chest wall, likely external.      Impression    No acute cardiopulmonary process. Left PICC projects over the  cavoatrial junction.   XR SHOULDER RIGHT     Status: None (Preliminary result)    Narrative    [name] [name]  Male, 59 years old.    XR SHOULDER RIGHT performed on 04/02/2020 12:09 AM.    REASON FOR EXAM:  Trauma, r/o fracture/dislocation (need lateral axial view  as well)    TECHNIQUE: 4 views/4 images submitted for interpretation.    COMPARISON: Outside right shoulder MRI 03/26/2020.      Impression    Patient has known osteomyelitis of the proximal humerus and  scapula and is status post irrigation and debridement. There are a few  focal osteopenic areas which may correlate with this; however, no definite  bony erosions are identified. There is diffuse soft tissue swelling about  the right shoulder. Surgical drains are in place. No acute fracture.     XR HUMERUS RIGHT     Status: None (Preliminary result)    Narrative    [name] [name]  Male, 59 years old.    XR HUMERUS RIGHT 2 VIEWS performed on 04/02/2020 1:01 AM.    REASON FOR EXAM:  Trauma, r/o fracture/dislocation    TECHNIQUE: 2 views/2 images submitted for interpretation.    COMPARISON: Right shoulder 04/02/2020 and outside MRI right shoulder  03/26/2020      Impression    Patient has known history of osteomyelitis of the proximal  humerus and scapula. No definitive bony erosions are seen. No definitive  bony erosions are detected. The patient is status post irrigation and  debridement with partially imaged surgical drains. No acute fracture.     MRI SCAPULA RIGHT W/WO CONTRAST     Status: None    Narrative    [name] [name]  Male, 59 years old.    MRI SCAPULA RIGHT W/WO CONTRAST performed on 04/02/2020 5:48 AM.    REASON FOR EXAM:  infection, need to see  entire scapula and surrounding  soft tissue, OR pending      INTRAVENOUS CONTRAST: 8 ml's of Gadavist  CREATININE/GFR: CR:1.36 mg/dL on 04/01/2020 GFR 56    TECHNIQUE: Multiplanar multisequence MR imaging of the right scapula  without and with contrast    COMPARISON: Radiographs dated 04/01/2020    FINDINGS: Image quality is degraded secondary to motion artifact. Two  surgical drains are in place.    There are multiple abscesses involving the supraspinatus, infraspinatus,  subscapularis, deltoid, biceps, rhomboid, latissimus dorsi, and trapezius  muscles. There is also abnormal bone marrow signal involving the humeral  head, greater and lesser tuberosities, and surgical neck, compatible with  osteomyelitis. The bone marrow signal of the glenoid is preserved. There is  a small amount of fluid in the glenohumeral joint.      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   XR SHOULDER RIGHT     Status: None    Narrative    [name] [name]  Male, 59 years old.    XR SHOULDER RIGHT performed on 04/02/2020 11:00 PM.    REASON FOR EXAM:  post-op    TECHNIQUE: 4 views/4 images submitted for interpretation.    COMPARISON:  Same date at 12:03 AM  Impression    There has been interval incision and drainage of the right  proximal humerus with placement of antibiotic beads. Soft tissue drain.  There are overlying skin staples.     Impression:   This is a 59 years old man with history of diabetes mellitus complicated by likely CKD, peripheral neuropathy, history of diabetic foot ulcer, history of osteomyelitis of the foot, history of MRSA preseptal eye cellulitis in 2020, anxiety, and depression who fell and developed right shoulder found to have osteomyelitis of the proximal humerus and scapula with extensive right shoulder pyomyositis. He underwent I&D of the shoulder and humerus yesterday and OR culture no growth to date. His blood culture from 04/01/20 grew CoNS likely  contaminant. At out side facility his blood culture was positive for GBS. Given his diabetic status likely his shoulder and proximal humerus infections is likely hematogenous in origin (possible from GBS bacteremia) but given the patient has been on IV antibiotics for many weeks before initial OR cultures performed at [facility], causative organism of right shoulder infection is not certain.  He was on IV antibiotics for some time thus we do not expect the OR culture to grow any organism.     Recommendations:  Continue metronidazole 500 mg oral Q 8 hours for at least 6 weeks from the last day of surgery   Continue Daptomycin current dose for 6 weeks from the last day of surgery   Cefepime 2 gram IV Q 12 hours for 6 weeks from the last day of surgery   Continue wound care of left heel ulcer   Wound vac per surgery   Follow OR culture result.  If any cultures return positive, please update ID team.  We will schedule follow-up in Ortho ID clinic 2 weeks after discharge.      OPAT consult   Avoid alcohol while on metronidazole.  We will sign off       Imagene Sheller, MD   ID fellow       04/04/2020  I saw and examined the patient.  I reviewed the fellow's note.  I agree with the findings and plan of care as documented in the fellow's note.  Any exceptions/additions are edited/noted.    Elenore Paddy, MD

## 2020-04-04 NOTE — Nurses Notes (Signed)
Arc brace reapplied.

## 2020-04-04 NOTE — Nurses Notes (Addendum)
Paged MD Alfonse Spruce at this time: "7NE room 4 J. Ronnald Ramp - I was wondering if pts PICC line was okay to use and if so can we get an order to TPA one of the lumens? thank you [phone]"    Orders placed for TPA and to maintain PICC line usage.    2336: Rooke boots delivered by materials and applied to BLE at this time.    1224: Unable to administer to tPA due to lumen unable to be flushed at all with multiple attempts. Charge RN notified and was told the lumen was like this on dayshift. Was told by charge RN that pt may even need a new PICC line by dayteam.

## 2020-04-04 NOTE — Care Management Notes (Signed)
04/04/20 0859   Patient Hand-Off   Clinical/Discharge Plan of Care Information Communicated to:  Medical Social Worker   Comments Arbie Cookey, MSW

## 2020-04-04 NOTE — Consults (Signed)
Outpatient Parenteral Antimicrobial Therapy Program      Patient Name: [name] [name]  MRN: [id number]  Patient Address: [place] [address]  DOB: [birth year]  Date: 04/04/20    Outpatient Antimicrobial Treatment Plan     Diagnosis Group:  Diagnosis:   Microorganisms:   Bone and joint infection Right shoulder abscess and associated Osteomyelitis           Antibiotics  IV Antibiotics:  Dose  Frequency Anticipated Stop date:  Comments:   Daptomycin 450 mg Every 24 hours  05/14/2020  6 mg/kg   Cefepime 2g Every 12 hours 05/14/2020    Oral Antibiotics:  Dose Frequency Anticipated Stop date:  Comment:   Metronidazole 500 mg Every 8 hours 05/14/2020    6 weeks from date of surgery (04/02/2020)    Important drug interactions:      Allergies:   No Known Allergies         Labs monitoring plan/orders  Lab Lab Frequency Additional Comments   CBC/diff, BUN, Creatinine, Hepatic function panel, Inflammatory CRP and CK level , ESR  Every Monday        OPAT Pharmacist:  Dr. Thomasene Ripple  Phone Number: [phone] Ext: [phone]  Fax Labs To: [phone]     Physician Monitoring OPAT Course:   Dr. Charyl Dancer Roidad  Phone Number: [phone]  Fax Labs To: [phone]       ID follow-up:   ID/Ortho follow up 2 weeks post discharge         Attending Physician     Dr. Eliberto Ivory, PharmD, [facility]   [phone]  Pager: [phone]

## 2020-04-04 NOTE — Consults (Signed)
[facility]  Service: Call  Attending: Enis Slipper  Progress Note  04/04/2020    Name: [name] [name]  DOB: 1961/05/04  MRN: [id number]    RECENT ORTHO SURGERY:  - I&D R shoulder = 7/25 (Attending Surgeon - Mendel Ryder)    SUBJECTIVE:  59 y.o. male resting in bed. Complains of some aching in his right shoulder.     OBJECTIVE:  AF, VSS  BP (!) 156/79 Comment: rn notified   Pulse 87    Temp 36.9 C (98.4 F)    Resp 16    Ht 1.803 m (5' 10.98")    Wt 83.6 kg (184 lb 4.9 oz)    SpO2 94%    BMI 25.72 kg/m         GEN - NAD,  resting in bed  MSK RUE:  Inspection- iWV in place. Drains in place. Upper drain (20/17.5/0) Medial drain (25/30/0)  Motor- weak thumbs up, okay sign, abducts fingers  Sensation- SILT r/u/m  Vasc- BCR fingertips      Today's Pertinent Labs:  Hbg 7.8, WBC 11.3    ASSESSMENT:  59 y.o. male 2 Days Post-Op s/p I&D R shoulder    PLAN:  - Weightbearing: NWB RUE. Maintain brace for now  - PT/OT: ordered. SNF  - DVT prophylaxis: heparin  - Antibiotics: Dapto, Cefepime, Flagy  - Cx: No initial growth  - Pain: per primary  - Drain: in place, output above  - Dressing: plan to leave iWV for next 3-5 days  - Nursing instructions: mobilize  - Dispo: pending Abx  - Follow-up: will see back in Dr. Kathrin Greathouse clinic 2 weeks    Verlon Au, MD  Resident, PGY-3  Department of Orthopaedics  Pager [phone]  04/04/2020 06:06

## 2020-04-04 NOTE — Nurses Notes (Signed)
Pt removed right arm from [facility] Brace. Pt refusing to put brace back on at this time, stated he needs a break from brace. Pt became agitated when attempted to reapply brace. Pt moving arm without difficult. Pt states after he eats breakfast will allow the brace to be put back on.

## 2020-04-04 NOTE — Progress Notes (Signed)
[facility]  General Medicine  Progress note    Date of Service:  04/04/2020  [name] [name], 59 y.o. male  Date of Admission:  04/01/2020  Date of Birth:  20-Jul-1961  PCP: [facility]    Subjective:  Patient is doing well.  Pain is controlled.  Discontinue IV morphine but continue oxycodone.  Patient continues on with wound VAC.  denies any numbness or tingling.    Objective:  I reviewed all vital signs.  Examination:    Temperature: 37 C (98.6 F) Heart Rate: 83 BP (Non-Invasive): (!) 149/78 (RN notified)   Respiratory Rate: 18 SpO2: 96 %     Constitutional: appears chronically ill, pale, no distress and vital signs reviewed and discussed with patient  Eyes: Conjunctiva clear., Pupils equal and round, reactive to light and accomodation. , Sclera non-icteric.   ENT: ENMT without erythema or injection, mucous membranes moist.  Neck: no thyromegaly or lymphadenopathy and thyroid: not enlarged, symmetric, no tenderness/mass/nodules  Respiratory: Clear to auscultation and percussion bilaterally. , Thoracic excursion  Normal  Cardiovascular: regular rate and rhythm, S1, S2 normal, no murmur, click, rub or gallop     Pulses 2/4, trace pedal edema  Gastrointestinal: Soft, non-tender, Bowel sounds normal, non-distended  Genitourinary: Deferred  Musculoskeletal: Multiple drains coming from right shoulder.  Multiple staples from previous surgeries.  Integumentary:  Skin warm and dry and No rashes  Neurologic: Grossly normal, CN II - XII grossly intact , Alert and oriented x3, No tremor, No asterixis  Lymphatic/Immunologic/Hematologic: No lymphadenopathy  Psychiatric: Affect Normal  Glasgow: Total score only: 15  Coma (GCS Coma less than 8): Coma -Not applicable    Labs: I have reviewed all lab results.  Lab Results Today:    Results for orders placed or performed during the hospital encounter of 04/01/20 (from the past 24 hour(s))   POC BLOOD GLUCOSE (RESULTS)   Result Value Ref Range    GLUCOSE, POC 241  (H) 70 - 105 mg/dl   POC BLOOD GLUCOSE (RESULTS)   Result Value Ref Range    GLUCOSE, POC 138 (H) 70 - 105 mg/dl   BASIC METABOLIC PANEL, FASTING   Result Value Ref Range    SODIUM 140 136 - 145 mmol/L    POTASSIUM 4.4 3.5 - 5.1 mmol/L    CHLORIDE 112 (H) 96 - 111 mmol/L    CO2 TOTAL 23 23 - 31 mmol/L    ANION GAP 5 4 - 13 mmol/L    CALCIUM 8.6 (L) 8.8 - 10.2 mg/dL    GLUCOSE 118 (H) 70 - 99 mg/dL    BUN 22 8 - 25 mg/dL    CREATININE 1.49 (H) 0.75 - 1.35 mg/dL    BUN/CREA RATIO 15 6 - 22    ESTIMATED GFR 50 (L) >=60 mL/min/BSA   MAGNESIUM   Result Value Ref Range    MAGNESIUM 2.0 1.8 - 2.6 mg/dL   C-REACTIVE PROTEIN(CRP),INFLAMMATION   Result Value Ref Range    CRP INFLAMMATION 45.7 (H) <8.0 mg/L   CBC WITH DIFF   Result Value Ref Range    WBC 11.3 (H) 3.7 - 11.0 x103/uL    RBC 2.61 (L) 4.50 - 6.10 x106/uL    HGB 7.8 (L) 13.4 - 17.5 g/dL    HCT 24.4 (L) 38.9 - 52.0 %    MCV 93.5 78.0 - 100.0 fL    MCH 29.9 26.0 - 32.0 pg    MCHC 32.0 31.0 - 35.5 g/dL  RDW-CV 15.1 11.5 - 15.5 %    PLATELETS 282 150 - 400 x103/uL    MPV 9.9 8.7 - 12.5 fL    NEUTROPHIL % 76 %    LYMPHOCYTE % 14 %    MONOCYTE % 7 %    EOSINOPHIL % 1 %    BASOPHIL % 1 %    NEUTROPHIL # 8.64 (H) 1.50 - 7.70 x103/uL    LYMPHOCYTE # 1.60 1.00 - 4.80 x103/uL    MONOCYTE # 0.78 0.20 - 1.10 x103/uL    EOSINOPHIL # <0.10 <=0.50 x103/uL    BASOPHIL # <0.10 <=0.20 x103/uL    IMMATURE GRANULOCYTE % 1 0 - 1 %    IMMATURE GRANULOCYTE # 0.16 (H) <0.10 x103/uL   POC BLOOD GLUCOSE (RESULTS)   Result Value Ref Range    GLUCOSE, POC 122 (H) 70 - 105 mg/dl   POC BLOOD GLUCOSE (RESULTS)   Result Value Ref Range    GLUCOSE, POC 133 (H) 70 - 105 mg/dl     DNR Status:  Full Code    Assessment/Plan:   Active Hospital Problems    Diagnosis    Primary Problem: Abscess of right shoulder     [name] [name] is a 59 y.o., white male with a history of anxiety, depression, diabetes with peripheral neuropathy, GERD, and diabetic foot wounds admitted as a direct admission from  [facility] for 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  -orthopedics performed surgery hete on 04/02/20.  Currently has wound VAC on.  -on Dapto, cefepime, and Flagyl with end date 05/14/20. OPAT complete. Has PICC.   -continue Oxycodone 5 or 10 for pain.  Discontinued morphine 7/27.  -1 culture was positive here but was contaminated.  No need to remove PICC line.    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  Advanced wound consulted.    -patient declined podiatry consult as he already follows with podiatrist outpatient.  -patient has known osteo on the left heel but wound appears to be well taken care of.  -patient was switched to daptomycin, cefepime, and Flagyl with a new end date of 05/14/20.   -stop drinking.    Thrombocytosis (RESOLVED)  -reactive and now resolving after surgery.  -stop drinking.    Anemia of chronic disease  -transfuse 2 units of blood on 04/01/20  -stop drinking.    Severe protein calorie malnutrition  -albumin very low likely from chronic inflammatory state and alcoholism.  -nutrition has been consulted.  -stop drinking.    CKD stage III  -continue to monitor.  Avoid nephrotoxic medication.  -stop drinking.    Hyperkalemia (RESOLVED)  -continue to monitor.    Anxiety / Depression  -continue Bupropion and Vistaril.  -stop drinking.    Diabetes with peripheral neuropathy  -continue Lantus 25 units nightly.  -started on lispro 3 units before meals.  -continue sliding scale insulin protocol.  -continue Neurontin but at reduced dosage due to CKD stage 3.   -stop drinking.      Alcohol use disorder, recurrent severe  -drinks 3-4 beers daily now but  admits to drinking hard liquor recently in cutting down.  -he denies any history of delirium tremens or withdrawal seizures.  -2 alcohol-related arrests but does not  consider his drinking a problem.  -continue thiamine.  -stop drinking.    Physical deconditioning  -PT OT recommending skilled nursing facility.  Patient will be ready for discharge in the next few days.  -stop drinking.    GERD  -continue famotidine.  -stop drinking.    DVT/PE Prophylaxis: Heparin    Disposition:  PT OT recommending skilled nursing facility.Randie Heinz, DO  Assistant Professor   Department of Internal Medicine

## 2020-04-04 NOTE — Care Plan (Signed)
Mr. [name] [name] was admitted on 04/01/20 with abscess of the right shoulder. Surgical patient, wound vacs placed.  Patient has diabetic wound to the left heel. Patient is alert and confused, oriented only to person and partial to time. Anticipated discharge to snf once goals are met per hospitalization. ARC brace maintained unless patient is eating refues to use during meals. Patient states, "i just want the brace off for 5 mins" when asked to keep it on during meals. PICC line unable to flush secondary line marked off. Primary line flushes without difficulty. Patient on sitter select for fall safety.  Problem: Adult Inpatient Plan of Care  Goal: Plan of Care Review  04/04/2020 1617 by Josefa Half, GN  Outcome: Ongoing (see interventions/notes)     Problem: Adult Inpatient Plan of Care  Goal: Patient-Specific Goal (Individualized)  04/04/2020 1617 by Josefa Half, GN  Outcome: Ongoing (see interventions/notes)

## 2020-04-05 LAB — BASIC METABOLIC PANEL, FASTING
ANION GAP: 3 mmol/L — ABNORMAL LOW (ref 4–13)
BUN/CREA RATIO: 16 (ref 6–22)
BUN: 21 mg/dL (ref 8–25)
CALCIUM: 8.8 mg/dL (ref 8.8–10.2)
CHLORIDE: 112 mmol/L — ABNORMAL HIGH (ref 96–111)
CO2 TOTAL: 23 mmol/L (ref 23–31)
CREATININE: 1.28 mg/dL (ref 0.75–1.35)
ESTIMATED GFR: 60 mL/min/BSA (ref >=60)
GLUCOSE: 100 mg/dL — ABNORMAL HIGH (ref 70–99)
POTASSIUM: 4.4 mmol/L (ref 3.5–5.1)
SODIUM: 138 mmol/L (ref 136–145)

## 2020-04-05 LAB — CROSSMATCH RED CELLS - UNITS: UNIT DIVISION: 0

## 2020-04-05 LAB — OPIATES CONFIRMATORY/DEFINITIVE, URINE, BY LC-MS/MS (PERFORMABLE)
CODEINE: NOT DETECTED ng/mL (ref <10)
DIHYDROCODEINE: NOT DETECTED ng/mL (ref <10)
HYDROCODONE: NOT DETECTED ng/mL (ref <5)
HYDROMORPHONE: NOT DETECTED ng/mL (ref <10)
MORPHINE: NOT DETECTED ng/mL (ref <20)
NALOXONE: NOT DETECTED ng/mL (ref <5)
NORHYDROCODONE: NOT DETECTED ng/mL (ref <10)
NOROXYCODONE: 927 ng/mL — ABNORMAL HIGH (ref <10)
OXYCODONE: >1000 ng/mL — ABNORMAL HIGH (ref <5)
OXYMORPHONE: 267 ng/mL — ABNORMAL HIGH (ref <15)

## 2020-04-05 LAB — CBC WITH DIFF
BASOPHIL #: <0.1 10*3/uL (ref <=0.20)
BASOPHIL %: 1 %
EOSINOPHIL #: 0.26 10*3/uL (ref <=0.50)
EOSINOPHIL %: 3 %
HCT: 23.8 % — ABNORMAL LOW (ref 38.9–52.0)
HGB: 7.5 g/dL — ABNORMAL LOW (ref 13.4–17.5)
IMMATURE GRANULOCYTE #: 0.2 10*3/uL — ABNORMAL HIGH (ref <0.10)
IMMATURE GRANULOCYTE %: 2 % — ABNORMAL HIGH (ref 0–1)
LYMPHOCYTE #: 1.55 10*3/uL (ref 1.00–4.80)
LYMPHOCYTE %: 15 %
MCH: 29.4 pg (ref 26.0–32.0)
MCHC: 31.5 g/dL (ref 31.0–35.5)
MCV: 93.3 fL (ref 78.0–100.0)
MONOCYTE #: 0.89 10*3/uL (ref 0.20–1.10)
MONOCYTE %: 9 %
MPV: 9.8 fL (ref 8.7–12.5)
NEUTROPHIL #: 7.14 10*3/uL (ref 1.50–7.70)
NEUTROPHIL %: 70 %
PLATELETS: 271 10*3/uL (ref 150–400)
RBC: 2.55 10*6/uL — ABNORMAL LOW (ref 4.50–6.10)
RDW-CV: 15.3 % (ref 11.5–15.5)
WBC: 10.1 10*3/uL (ref 3.7–11.0)

## 2020-04-05 LAB — TYPE AND SCREEN
ABO/RH(D): O POS
ANTIBODY SCREEN: NEGATIVE
UNITS ORDERED: 4

## 2020-04-05 LAB — POC BLOOD GLUCOSE (RESULTS)
GLUCOSE, POC: 112 mg/dL — ABNORMAL HIGH (ref 70–105)
GLUCOSE, POC: 189 mg/dL — ABNORMAL HIGH (ref 70–105)
GLUCOSE, POC: 120 mg/dL — ABNORMAL HIGH (ref 70–105)
GLUCOSE, POC: 184 mg/dL — ABNORMAL HIGH (ref 70–105)

## 2020-04-05 LAB — MAGNESIUM: MAGNESIUM: 1.8 mg/dL (ref 1.8–2.6)

## 2020-04-05 MED ORDER — GABAPENTIN 300 MG CAPSULE
600.0000 mg | ORAL_CAPSULE | Freq: Three times a day (TID) | ORAL | Status: DC
Start: 2020-04-05 — End: 2020-04-10
  Administered 2020-04-05 – 2020-04-10 (×16): 600 mg via ORAL
  Filled 2020-04-05 (×16): qty 2

## 2020-04-05 MED ORDER — OXYCODONE 5 MG TABLET
10.0000 mg | ORAL_TABLET | ORAL | Status: DC | PRN
Start: 2020-04-05 — End: 2020-04-10
  Administered 2020-04-06: 0 mg via ORAL
  Filled 2020-04-05 (×2): qty 2

## 2020-04-05 MED ORDER — OXYCODONE 5 MG TABLET
15.0000 mg | ORAL_TABLET | ORAL | Status: DC | PRN
Start: 2020-04-05 — End: 2020-04-10
  Administered 2020-04-05 – 2020-04-10 (×30): 15 mg via ORAL
  Filled 2020-04-05 (×31): qty 3

## 2020-04-05 MED ORDER — POLYETHYLENE GLYCOL 3350 17 GRAM ORAL POWDER PACKET
17.0000 g | Freq: Every day | ORAL | Status: DC
Start: 2020-04-06 — End: 2022-02-06

## 2020-04-05 MED ORDER — SENNOSIDES 8.6 MG-DOCUSATE SODIUM 50 MG TABLET
2.0000 | ORAL_TABLET | Freq: Two times a day (BID) | ORAL | Status: DC
Start: 2020-04-05 — End: 2023-03-12

## 2020-04-05 NOTE — Care Management Notes (Addendum)
14:29 - MSW attempted to speak with pt regarding discharge plans/recommendations; however, pt was off the floor with PT/OT. Will check back at a later time.       15:14 - Spoke with pt and obtained PAS signature page. Pt would no provide CHOICE for SNF. Pt stated he was not ready for discharge and thought he might just go home. Will attempted to get CHOICE at a later time.

## 2020-04-05 NOTE — Care Management Notes (Signed)
[facility] Management Note    Patient Name: [name] [name]  Date of Birth: 1961/01/03  Sex: male  Date/Time of Admission: 04/01/2020 12:23 PM  Room/Bed: 04/A  Payor: [place] MEDICARE / Plan: [place] MEDICARE ADVANTAGE PPO / Product Type: PPO /    LOS: 4 days   Primary Care Providers:  [facility] ([facility])    Admitting Diagnosis:  Abscess of right shoulder [L02.413]    Assessment:      04/05/20 2159   Assessment Details   Assessment Type Continued Assessment   Date of Care Management Update 04/05/20   Date of Next DCP Update 04/07/20   Care Management Plan   Discharge Planning Status plan in progress   Projected Discharge Date 04/11/20   Discharge Needs Assessment   Discharge Facility/Level of Care Needs SNF Placement (Medicare certified)(code 3)       Discharge Plan:  SNF Placement (Medicare certified) (code 3)  Per chart review, pt is not discharge ready at this time. PT/OT following and recommending SNF placement. MSW Burt Knack met with pt earlier today and he refused to provide choice for SNF placement.     The patient will continue to be evaluated for developing discharge needs.     Case Manager: Ivor Costa, [place]  Phone: [phone]

## 2020-04-05 NOTE — Pharmacy (Signed)
Pharmacy Medication Reconciliation    Patient Name: [name] [name], [name] [name]  Date of Service: 04/05/2020  Date of Admission: 04/01/2020  Date of Birth: 1961/06/25  Length of Stay:   4 days   General Risk Score (GRS): 4  Service: HOSPITALIST 11  Med history was collected from:     Transitions of Care:  1. Would you like to utilize the [facility]?   No -  If no, preferred pharmacy:  [facility] [phone]    Do you have a way to pay for your discharge prescription medications at the time of discharge?      Information was collected from:  Patient - remotely, Pharmacy, and Reviewed off site  [facility] [phone]    Clarified Prior to Admission Medications:  Prior to Admission medications    Medication Sig Taking Resumed Y/N Comments   famotidine (PEPCID) 40 mg Oral Tablet Take 40 mg by mouth Every evening Yes  Y     insulin glargine 100 unit/mL Subcutaneous injection (vial) 25 Units by Subcutaneous route Every night Yes  Y     insulin lispro (HUMALOG) 100 unit/mL Subcutaneous Solution 5 Units by Subcutaneous route Three times daily before meals Yes  Y  Per pt he does not use a sliding scale    Was ordered inpatient as 3 units three times daily before meals.        Did patient's home medication list require updates? Yes    Changes made to Prior to Admission Med List:  Medication Added  Humalog     Medications Deleted:  - Tylenol   - Bupropion SR 150mg    - Hydroxyzine Pamoate 25 mg   - Oxycodone 5mg    - Thiamine   - Gabapentin     Allergies:  NKA    Alanson Puls, CPhT    Did pharmacist make suggestions for medication reconciliation? Yes    Pharmacist Recommendations:   Patient was not taking bupropion, hydroxyzine, or thiamine at home. Assess the need for continuation; and if decided to continue, patient will need new prescriptions for discharge.   Patient reports that he was taking Gabapentin 400mg  3 tabs (1200 mg) three times daily, but he ran out of medication because his PCP would no longer precribe it. The  PMP also shows a prescription for gabapentin 800mg  three times daily for a 10 day supply from a hospital discharge. The most recent prescription was last filled 12/04/2019, so he has not had gabapentin since March. Please consider discontinuing.     Azzie Roup, PHARMACY RESIDENT

## 2020-04-05 NOTE — Care Plan (Signed)
[facility]  Physical Therapy Progress Note      Patient Name: [name] [name]  Date of Birth: 1961-04-16  Height:  180.3 cm (5' 10.98")  Weight:  83.6 kg (184 lb 4.9 oz)  Room/Bed: 04/A  Payor: [place] MEDICARE / Plan:  MEDICARE ADVANTAGE PPO / Product Type: PPO /     Assessment:     (P) Pt tolerated session well.  Ambulated in // bars today using only L bar with assist x 1 and assistance to manage lines.  Pt needed hands on assistance for gait and transfers throughout.   Not safe for home d/c.   Recommend SNF at d/c.    Discharge Needs:   Equipment Recommendation: (P) TBD      Discharge Disposition: (P) skilled nursing facility  JUSTIFICATION OF DISCHARGE RECOMMENDATION   Based on current diagnosis, functional performance prior to admission, and current functional performance, this patient requires continued PT services in (P) skilled nursing facility in order to achieve significant functional improvements in these deficit areas: aerobic capacity/endurance, gait, locomotion, and balance, integumentary integrity. The above recommendation is based upon the current examination and evaluation performed on this date. As subsequent sessions are completed, recommendations will be updated accordingly.      Plan:   Continue to follow patient according to established plan of care.  The risks/benefits of therapy have been discussed with the patient/caregiver and he/she is in agreement with the established plan of care.     Subjective & Objective:        04/05/20 1456   Therapist Pager   PT Assigned/ Pager # [phone]   Rehab Session   Document Type therapy progress note (daily note)   Total PT Minutes: 34   Patient Effort good   Symptoms Noted During/After Treatment fatigue   General Information   Medical Lines PICC Line   Respiratory Status room air   Existing Precautions/Restrictions full code;fall precautions;weight bearing restriction   General Observations: Pt reclined in bed.  Agreeable  to PT session.   Weight-bearing Status   Right Upper Extremity non weight-bearing (NWB)   Mutuality/Individual Preferences   Individualized Care Needs OOB to chair with sara stedy and assist x 2 NWB RUE   Patient-Specific Goals (Include Timeframe) Pain control   Plan of Care Reviewed With patient   Pre Treatment Status   Pre Treatment Patient Status Patient supine in bed;Call light within reach   Support Present Pre Treatment  Nurse present   Communication Pre Treatment  Nurse   Communication Pre Treatment Comment aware of session   Bed Mobility Assessment/Treatment   Bed Mobility, Assistive Device Head of Bed Elevated   Supine-Sit [place] stand-by assistance   Safety Issues decreased use of arms for pushing/pulling   Impairments cognition impaired;coordination impaired;endurance;pain;ROM decreased;strength decreased   Transfer Assessment/Treatment   Sit-Stand [place] moderate assist (50% patient effort)   Stand-Sit [place] moderate assist (50% patient effort)   Sit-Stand-Sit, Assist Device handheld assist   Bed-Chair Independence modified independence;2 person assist required   Transfer Impairments balance impaired;coordination impaired;endurance;flexibility decreased;pain;ROM decreased;strength decreased   Gait Assessment/Treatment   Total Distance Ambulated 20   Independence  minimum assist (75% patient effort);1 person + 1 person to manage equipment   Assistive Device  parallel bars   Distance in Feet 20   Gait Speed decreased   Deviations  cadence decreased;increased trunk sway;weight-shifting ability decreased;step length decreased   Maintain Weight Bearing Status able to maintain;verbal cues to maintain  Safety Issues  balance decreased during turns;sequencing ability decreased;step length decreased;weight-shifting ability decreased   Impairments  balance impaired;coordination impaired;endurance;pain;ROM decreased;strength decreased   Comment L bar only   Balance Skill Training   Sitting  Balance: Static good balance   Sitting, Dynamic (Balance) good balance   Sit-to-Stand Balance fair - balance   Standing Balance: Static fair - balance   Standing Balance: Dynamic poor + balance   Systems Impairment Contributing to Balance Disturbance neuromuscular;musculoskeletal   Therapeutic Exercise/Activity   Comment ARC removed for R elbow/wrist/hand ROM   Orthotics/Misc Device    Shoulder Brace On;ARC Shoulder Brace   Post Treatment Status   Post Treatment Patient Status Patient sitting in bedside chair or w/c;Call light within reach;Telephone within reach;Consulting civil engineer Treatment Comment aware of performance   Plan of Care Review   Plan Of Care Reviewed With patient   Basic Mobility Am-PAC/6Clicks Score (APPROVED PT Staff and [facility] Nursing ONLY   Turning in bed without bedrails 3   Lying on back to sitting on edge of flat bed 3   Moving to and from a bed to a chair 2   Standing up from chair 2   Walk in room 2   Climbing 3-5 steps with railing 1   6 Clicks Raw Score total 13   Standardized (t-scale) score 33.99   CMS 0-100% Score 57.65   CMS Modifier CK   Patient Mobility Goal (JHHLM) 4- Move to chair 3X/day   Exercise/Activity Level Performed 6- Walked 10 steps or more   Physical Therapy Clinical Impression   Assessment Pt tolerated session well.  Ambulated in // bars today using only L bar with assist x 1 and assistance to manage lines.  Pt needed hands on assistance for gait and transfers throughout.   Not safe for home d/c.   Recommend SNF at d/c.   Anticipated Equipment Needs at Discharge (PT) TBD   Anticipated Discharge Disposition skilled nursing facility       Therapist:   Judie Bonus, PT   Pager #: [phone]

## 2020-04-05 NOTE — Progress Notes (Signed)
[facility]  General Medicine  Progress note    Date of Service:  04/05/2020  [name] [name], 59 y.o. male  Date of Admission:  04/01/2020  Date of Birth:  [birth year]  PCP: [facility]  CC: pyomyositis, osteomyelitis  Subjective:  Patient states that his pain is 9/10 despite getting "little pills for pain". He denies any fevers, chills, sweats, chest pain, shortness of breath, abdominal pain, diarrhea, constipation or any urinary tract symptoms. He has no additional complaints at this time.    Discussed plan of care with patient  - Dapto, cefepime, flagyl  - Wound vac management per orthopaedics  - SNF placement once wound vac can be removed.  - pain management.  Objective:  I reviewed all vital signs.  Examination:    Temperature: 36.8 C (98.2 F) Heart Rate: 87 BP (Non-Invasive): 134/73   Respiratory Rate: 14 SpO2: 96 %       Physical Exam:   General:Cooperative male in no acute distress. VS reviewed  Lungs: Clear to auscultation bilaterally. No crackles, rales or wheezing   Cardiovascular: regular rate and rhythm, S1, S2 normal, no murmur, click, rub or gallop   Abdomen: Soft, non-tender, Bowel sounds normal, non-distended, No hepatosplenomegaly   Extremities: No cyanosis or edema; Drains from right shoulder, device on shoulder  Neurologic: Alert and oriented x3      Labs: I have reviewed all lab results.  Lab Results Today:    Results for orders placed or performed during the hospital encounter of 04/01/20 (from the past 24 hour(s))   POC BLOOD GLUCOSE (RESULTS)   Result Value Ref Range    GLUCOSE, POC 133 (H) 70 - 105 mg/dl   POC BLOOD GLUCOSE (RESULTS)   Result Value Ref Range    GLUCOSE, POC 119 (H) 70 - 105 mg/dl   POC BLOOD GLUCOSE (RESULTS)   Result Value Ref Range    GLUCOSE, POC 155 (H) 70 - 105 mg/dl   BASIC METABOLIC PANEL, FASTING   Result Value Ref Range    SODIUM 138 136 - 145 mmol/L    POTASSIUM 4.4 3.5 - 5.1 mmol/L    CHLORIDE 112 (H) 96 - 111 mmol/L    CO2 TOTAL 23 23 - 31  mmol/L    ANION GAP 3 (L) 4 - 13 mmol/L    CALCIUM 8.8 8.8 - 10.2 mg/dL    GLUCOSE 100 (H) 70 - 99 mg/dL    BUN 21 8 - 25 mg/dL    CREATININE 1.28 0.75 - 1.35 mg/dL    BUN/CREA RATIO 16 6 - 22    ESTIMATED GFR 60 >=60 mL/min/BSA   MAGNESIUM   Result Value Ref Range    MAGNESIUM 1.8 1.8 - 2.6 mg/dL   CBC WITH DIFF   Result Value Ref Range    WBC 10.1 3.7 - 11.0 x103/uL    RBC 2.55 (L) 4.50 - 6.10 x106/uL    HGB 7.5 (L) 13.4 - 17.5 g/dL    HCT 23.8 (L) 38.9 - 52.0 %    MCV 93.3 78.0 - 100.0 fL    MCH 29.4 26.0 - 32.0 pg    MCHC 31.5 31.0 - 35.5 g/dL    RDW-CV 15.3 11.5 - 15.5 %    PLATELETS 271 150 - 400 x103/uL    MPV 9.8 8.7 - 12.5 fL    NEUTROPHIL % 70 %    LYMPHOCYTE % 15 %    MONOCYTE % 9 %    EOSINOPHIL %  3 %    BASOPHIL % 1 %    NEUTROPHIL # 7.14 1.50 - 7.70 x103/uL    LYMPHOCYTE # 1.55 1.00 - 4.80 x103/uL    MONOCYTE # 0.89 0.20 - 1.10 x103/uL    EOSINOPHIL # 0.26 <=0.50 x103/uL    BASOPHIL # <0.10 <=0.20 x103/uL    IMMATURE GRANULOCYTE % 2 (H) 0 - 1 %    IMMATURE GRANULOCYTE # 0.20 (H) <0.10 x103/uL   POC BLOOD GLUCOSE (RESULTS)   Result Value Ref Range    GLUCOSE, POC 112 (H) 70 - 105 mg/dl     DNR Status:  Full Code    Assessment/Plan:   Active Hospital Problems    Diagnosis    Primary Problem: Abscess of right shoulder     [name] [name] is a 59 y.o., white male with a history of anxiety, depression, diabetes with peripheral neuropathy, GERD, and diabetic foot wounds admitted as a direct admission from [facility] for 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  -orthopedics performed surgery hete on 04/02/20.  Currently has wound VAC on.  -on Dapto, cefepime, and Flagyl with end date 05/14/20. OPAT complete.  Has PICC.   -continue Oxycodone 5 or 10 for pain.  Discontinued morphine 7/27.  -1 culture was positive here but was contaminated.  No need to remove PICC line.    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  Advanced wound consulted.    -patient declined podiatry consult as he already follows with podiatrist outpatient.  -patient has known osteo on the left heel but wound appears to be well taken care of.  -patient was switched to daptomycin, cefepime, and Flagyl with a new end date of 05/14/20.   -stop drinking.    Thrombocytosis (RESOLVED)  -reactive and now resolving after surgery.  -stop drinking.    Anemia of chronic disease  -transfuse 2 units of blood on 04/01/20  -stop drinking.    Severe protein calorie malnutrition  -albumin very low likely from chronic inflammatory state and alcoholism.  -nutrition has been consulted.  -stop drinking.    CKD stage III  -continue to monitor.  Avoid nephrotoxic medication.  -stop drinking.    Hyperkalemia (RESOLVED)  -continue to monitor.    Anxiety / Depression  -continue Bupropion and Vistaril.  -stop drinking.    Diabetes with peripheral neuropathy  -continue Lantus 25 units nightly.  -started on lispro 3 units before meals.  -continue sliding scale insulin protocol.  -continue Neurontin but at reduced dosage due to CKD stage 3.   -stop drinking.      Alcohol use disorder, recurrent severe  -drinks 3-4 beers daily now but admits to drinking hard liquor recently in cutting down.  -he denies any history of delirium tremens or withdrawal seizures.  -2 alcohol-related arrests but does not consider his drinking a problem.  -continue thiamine.  -stop drinking.    Physical deconditioning  -PT OT recommending skilled nursing facility.  Patient will be ready for discharge in the next few days.  -stop drinking.    GERD  -continue famotidine.  -stop drinking.    DVT/PE Prophylaxis: Heparin    Disposition:  PT OT recommending skilled  nursing facility. Will be ready for discharge once wound vac  is out.    Howell Rucks, MD 04/05/2020 07:27  Hospitalist  Department of Internal Medicine  [facility]

## 2020-04-05 NOTE — Consults (Signed)
[facility]  Service: Call  Attending: Enis Slipper  Progress Note  04/05/2020    Name: [name] [name]  DOB: Feb 04, 1961  MRN: [id number]    RECENT ORTHO SURGERY:  - I&D R shoulder = 7/25 (Attending Surgeon - Mendel Ryder)    SUBJECTIVE:  59 y.o. male resting in bed. Says he has been out of brace working on elbow ROM.     OBJECTIVE:  AF, VSS  BP 134/73    Pulse 87    Temp 36.8 C (98.2 F)    Resp 14    Ht 1.803 m (5' 10.98")    Wt 83.6 kg (184 lb 4.9 oz)    SpO2 96%    BMI 25.72 kg/m         GEN - NAD,  resting in bed  MSK RUE:  Inspection- iWV in place. Drains in place. Upper drain (01/16/09) Medial drain (06/23/09)  Motor- weak thumbs up, okay sign, abducts fingers  Sensation- SILT r/u/m  Vasc- BCR fingertips      Today's Pertinent Labs:  Hbg 7.5    ASSESSMENT:  59 y.o. male 3 Days Post-Op s/p I&D R shoulder    PLAN:  -Drain output down trending. Plan to change wound vac tomorrow  -PICC line in place  - Weightbearing: NWB RUE. Maintain brace for now  - PT/OT: ordered. SNF. Please have patient come out of brace to work on elbow ROM  - DVT prophylaxis: heparin  - Antibiotics: Dapto, Cefepime, Flagy  - Cx: No initial growth  - Pain: per primary  - Drain: in place, output above  - Dressing: plan to change tomorrow  - Nursing instructions: mobilize  - Dispo: pending Abx  - Follow-up: will see back in Dr. Kathrin Greathouse clinic 2 weeks    Verlon Au, MD  Resident, PGY-3  Department of Orthopaedics  Pager [phone]  04/05/2020 07:31

## 2020-04-06 ENCOUNTER — Encounter (INDEPENDENT_AMBULATORY_CARE_PROVIDER_SITE_OTHER): Payer: Self-pay | Admitting: Family

## 2020-04-06 LAB — PHOSPHORUS: PHOSPHORUS: 3.2 mg/dL (ref 2.3–4.0)

## 2020-04-06 LAB — BASIC METABOLIC PANEL
ANION GAP: 2 mmol/L — ABNORMAL LOW (ref 4–13)
BUN/CREA RATIO: 16 (ref 6–22)
BUN: 19 mg/dL (ref 8–25)
CALCIUM: 8.8 mg/dL (ref 8.8–10.2)
CHLORIDE: 113 mmol/L — ABNORMAL HIGH (ref 96–111)
CO2 TOTAL: 23 mmol/L (ref 23–31)
CREATININE: 1.16 mg/dL (ref 0.75–1.35)
ESTIMATED GFR: 68 mL/min/BSA (ref >=60)
GLUCOSE: 138 mg/dL — ABNORMAL HIGH (ref 65–125)
POTASSIUM: 4.3 mmol/L (ref 3.5–5.1)
SODIUM: 138 mmol/L (ref 136–145)

## 2020-04-06 LAB — CBC WITH DIFF
HCT: 24.8 % — ABNORMAL LOW (ref 38.9–52.0)
HGB: 7.8 g/dL — ABNORMAL LOW (ref 13.4–17.5)
MCH: 29.5 pg (ref 26.0–32.0)
MCHC: 31.5 g/dL (ref 31.0–35.5)
MCV: 93.9 fL (ref 78.0–100.0)
MPV: 10 fL (ref 8.7–12.5)
PLATELETS: 276 10*3/uL (ref 150–400)
RBC: 2.64 10*6/uL — ABNORMAL LOW (ref 4.50–6.10)
RDW-CV: 15.3 % (ref 11.5–15.5)
WBC: 10.4 10*3/uL (ref 3.7–11.0)

## 2020-04-06 LAB — MANUAL DIFF AND MORPHOLOGY-SYSMEX
BASOPHIL #: 0.1 10*3/uL (ref <=0.20)
BASOPHIL %: 1 %
EOSINOPHIL #: 0.31 10*3/uL (ref <=0.50)
EOSINOPHIL %: 3 %
LYMPHOCYTE #: 1.56 10*3/uL (ref 1.00–4.80)
LYMPHOCYTE %: 15 %
MONOCYTE #: 0.83 10*3/uL (ref 0.20–1.10)
MONOCYTE %: 8 %
NEUTROPHIL #: 7.59 10*3/uL (ref 1.50–7.70)
NEUTROPHIL %: 73 %
NRBC FROM MANUAL DIFF: 0 per 100 WBC
RBC MORPHOLOGY: NORMAL

## 2020-04-06 LAB — BLOOD CULTURE (DETERMINE LINE SEPSIS): BLOOD CULTURE, LINE SEPSIS: NO GROWTH

## 2020-04-06 LAB — POC BLOOD GLUCOSE (RESULTS)
GLUCOSE, POC: 144 mg/dL — ABNORMAL HIGH (ref 70–105)
GLUCOSE, POC: 126 mg/dL — ABNORMAL HIGH (ref 70–105)
GLUCOSE, POC: 163 mg/dL — ABNORMAL HIGH (ref 70–105)
GLUCOSE, POC: 193 mg/dL — ABNORMAL HIGH (ref 70–105)

## 2020-04-06 LAB — MAGNESIUM: MAGNESIUM: 1.7 mg/dL — ABNORMAL LOW (ref 1.8–2.6)

## 2020-04-06 NOTE — Transitional Care (Signed)
[facility] Medicine   Transitional Care Coordination   Initial Assessment       Name: [name] [name]   Date of Birth: 10/30/1960 59 y.o.  Date of service: 04/06/2020  Lay Caregiver:  ,  ,         Will defer initial assessment at this time 2/2 discharge disposition of SNF/Acute Rehab (non psych) based on care management note.  Will continue to review chart to assess for changes to discharge disposition as long as pt remains on Hospitalist service.Ella Jubilee LPN Hospitalist Coordinator [place] 04/06/20 at 14:38.

## 2020-04-06 NOTE — Consults (Signed)
[facility]  Service: Call  Attending: Enis Slipper  Progress Note  04/06/2020    Name: [name] [name]  DOB: 11/03/1960  MRN: [id number]    RECENT ORTHO SURGERY:  - I&D R shoulder = 7/25 (Attending Surgeon - Mendel Ryder)    SUBJECTIVE:  59 y.o. male resting in bed. Complains of some shoulder pain but says it is improving.     OBJECTIVE:  AF, VSS  BP (!) 140/62   Pulse 83   Temp 36.9 C (98.5 F)   Resp 18   Ht 1.803 m (5' 10.98")   Wt 83.6 kg (184 lb 4.9 oz)   SpO2 95%   BMI 25.72 kg/m         GEN - NAD,  resting in bed  MSK RUE:  Inspection- iWV in place. Drains in place. Upper drain (5/7.5/0) Medial drain (01/22/16.5)  Motor- weak thumbs up, okay sign, abducts fingers  Sensation- SILT r/u/m  Vasc- BCR fingertips                Today's Pertinent Labs:  Hbg 7.8    ASSESSMENT:  59 y.o. male 4 Days Post-Op s/p I&D R shoulder    PLAN:  -Drain output down trending. Wound vac changed today. Will discuss replacing wound vac versus dry dressing this AM.  -PICC line in place  - Weightbearing: NWB RUE. Maintain brace for now  - PT/OT: ordered. SNF. Please have patient come out of brace to work on elbow ROM  - DVT prophylaxis: heparin  - Antibiotics: Dapto, Cefepime, Flagyl  - Cx: No initial growth  - Pain: per primary  - Drain: in place, output above  - Dressing: plan to change tomorrow  - Nursing instructions: mobilize  - Dispo: pending Abx  - Follow-up: will see back in Dr. Kathrin Greathouse clinic 2 weeks    Verlon Au, MD  Resident, PGY-3  Department of Orthopaedics  Pager [phone]  04/06/2020 07:16     ADDENDUM   - Upper drain and wound vac removed this AM  -Will trend medial drain overnight, hopefully will be able to remove tomorrow    Verlon Au, MD  Resident, PGY-3  Department of Orthopaedics  Pager [phone]  04/06/2020 08:30

## 2020-04-06 NOTE — Nurses Notes (Signed)
Pt's hemovac removed on accident by patient, paged service and MD Suzie Portela stated to cover site with gauze and tape and someone would come to assess it.

## 2020-04-06 NOTE — Care Management Notes (Signed)
[facility] Management Note    Patient Name: [name] [name]  Date of Birth: 08-03-1961  Sex: male  Date/Time of Admission: 04/01/2020 12:23 PM  Room/Bed: 04/A  Payor: [place] MEDICARE / Plan: [place] MEDICARE ADVANTAGE PPO / Product Type: PPO /    LOS: 5 days   Primary Care Providers:  [facility] [place] (General)    Admitting Diagnosis:  Abscess of right shoulder [L02.413]    Assessment:      04/06/20 1503   Assessment Details   Assessment Type Continued Assessment   Date of Care Management Update 04/06/20   Date of Next DCP Update 04/07/20   Care Management Plan   Discharge Planning Status plan in progress   Projected Discharge Date 04/08/20   Discharge plan discussed with: Patient   Patient choice offered to patient/family yes   Form for patient choice reviewed/signed and on chart no   Discharge Needs Assessment   Discharge Facility/Level of Care Needs SNF Placement (Medicare certified)(code 3)       Discharge Plan:  SNF Placement (Medicare certified) (code 3)  [place] PASS submitted and approved.  CCC met with patient for discharge planning.  Discussed PT/OT recommendations of SNF.  Pt did not want to choose a facility saying he needed to stay in the hospital several more days and he's not ready to discharge.  I explained that I do not decide when he discharges but we need to start making arrangements because it may take a few days.  Pt repeatedly changing the subject and asking for pain meds.  Was finally able to get choice by going through each facility near his home one at a time.  Referrals sent  to [facility], [facility], and [facility].      The patient will continue to be evaluated for developing discharge needs.     Case Manager: Alonza Smoker, [place] COORDINATOR  Phone: [phone]

## 2020-04-06 NOTE — Progress Notes (Signed)
[facility]  General Medicine  Progress note    Date of Service:  04/06/2020  [name] [name], 59 y.o. male  Date of Admission:  04/01/2020  Date of Birth:  1961/05/12  PCP: [facility]  CC: pyomyositis, osteomyelitis  Subjective:  Patient doing well at this time.  He states that the increase in his pain medications helped and he is much more comfortable today than he was yesterday.  States that the drains were taken out by the surgical team and staff will be by to determine whether they can remain out.  I discussed with him once the drain is are out then we will work on getting him placement at a skilled nursing facility.  He is doing well and currently denies any fevers, chills, shortness of breath, chest pain, abdominal pain, nausea, vomiting, diarrhea, constipation or any urine tract symptoms at this time.    Objective:  I reviewed all vital signs.  Examination:    Temperature: 36.9 C (98.5 F) Heart Rate: 83 BP (Non-Invasive): (!) 140/62   Respiratory Rate: 18 SpO2: 95 %       Physical Exam:   General:Cooperative male in no acute distress. VS reviewed.  Cooperative with interview and exam  Lungs: Clear to auscultation bilaterally. No crackles, rales or wheezing   Cardiovascular: regular rate and rhythm, S1, S2 normal, no murmur, click, rub or gallop   Abdomen: Soft, non-tender, Bowel sounds normal, non-distended, No hepatosplenomegaly   Extremities: No cyanosis or edema; Drains from right shoulder, device on shoulder  Neurologic: Alert and oriented x3      Labs: I have reviewed all lab results.  Lab Results Today:    Results for orders placed or performed during the hospital encounter of 04/01/20 (from the past 24 hour(s))   POC BLOOD GLUCOSE (RESULTS)   Result Value Ref Range    GLUCOSE, POC 189 (H) 70 - 105 mg/dl   POC BLOOD GLUCOSE (RESULTS)   Result Value Ref Range    GLUCOSE, POC 120 (H) 70 - 105 mg/dl   POC BLOOD GLUCOSE (RESULTS)   Result Value Ref Range    GLUCOSE, POC 184 (H) 70 - 105  mg/dl   BASIC METABOLIC PANEL   Result Value Ref Range    SODIUM 138 136 - 145 mmol/L    POTASSIUM 4.3 3.5 - 5.1 mmol/L    CHLORIDE 113 (H) 96 - 111 mmol/L    CO2 TOTAL 23 23 - 31 mmol/L    ANION GAP 2 (L) 4 - 13 mmol/L    CALCIUM 8.8 8.8 - 10.2 mg/dL    GLUCOSE 138 (H) 65 - 125 mg/dL    BUN 19 8 - 25 mg/dL    CREATININE 1.16 0.75 - 1.35 mg/dL    BUN/CREA RATIO 16 6 - 22    ESTIMATED GFR 68 >=60 mL/min/BSA   MAGNESIUM   Result Value Ref Range    MAGNESIUM 1.7 (L) 1.8 - 2.6 mg/dL   PHOSPHORUS   Result Value Ref Range    PHOSPHORUS 3.2 2.3 - 4.0 mg/dL   CBC WITH DIFF   Result Value Ref Range    WBC 10.4 3.7 - 11.0 x103/uL    RBC 2.64 (L) 4.50 - 6.10 x106/uL    HGB 7.8 (L) 13.4 - 17.5 g/dL    HCT 24.8 (L) 38.9 - 52.0 %    MCV 93.9 78.0 - 100.0 fL    MCH 29.5 26.0 - 32.0 pg    MCHC 31.5  31.0 - 35.5 g/dL    RDW-CV 15.3 11.5 - 15.5 %    PLATELETS 276 150 - 400 x103/uL    MPV 10.0 8.7 - 12.5 fL   MANUAL DIFF AND MORPHOLOGY-SYSMEX   Result Value Ref Range    NEUTROPHIL % 73 %    LYMPHOCYTE %  15 %    MONOCYTE % 8 %    EOSINOPHIL % 3 %    BASOPHIL % 1 %    NEUTROPHIL # 7.59 1.50 - 7.70 x103/uL    LYMPHOCYTE # 1.56 1.00 - 4.80 x103/uL    MONOCYTE # 0.83 0.20 - 1.10 x103/uL    EOSINOPHIL # 0.31 <=0.50 x103/uL    BASOPHIL # 0.10 <=0.20 x103/uL    NRBC FROM MANUAL DIFF 0 per 100 WBC    RBC MORPHOLOGY Normal RBC and PLT Morphology    POC BLOOD GLUCOSE (RESULTS)   Result Value Ref Range    GLUCOSE, POC 144 (H) 70 - 105 mg/dl     DNR Status:  Full Code    Assessment/Plan:   Active Hospital Problems    Diagnosis    Primary Problem: Abscess of right shoulder     [name] [name] is a 59 y.o., white male with a history of anxiety, depression, diabetes with peripheral neuropathy, GERD, and diabetic foot wounds admitted as a direct admission from [facility] for 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  -orthopedics performed surgery hete on 04/02/20.  Currently has wound VAC on.  -on Dapto, cefepime, and Flagyl with end date 05/14/20. OPAT complete. Has PICC.   -continue Oxycodone 5 or 10 for pain.  Discontinued morphine 7/27.  -1 culture was positive here but was contaminated.  No need to remove PICC line.    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  Advanced wound consulted.    -patient declined podiatry consult as he already follows with podiatrist outpatient.  -patient has known osteo on the left heel but wound appears to be well taken care of.  -patient was switched to daptomycin, cefepime, and Flagyl with a new end date of 05/14/20.   -stop drinking.    Thrombocytosis (RESOLVED)  -reactive and now resolving after surgery.  -stop drinking.    Anemia of chronic disease  -transfuse 2 units of blood on 04/01/20  -stop drinking.    Severe protein calorie malnutrition  -albumin very low likely from chronic inflammatory state and alcoholism.  -nutrition has been consulted.  -stop drinking.    CKD stage III  -continue to monitor.  Avoid nephrotoxic medication.  -stop drinking.    Hyperkalemia (RESOLVED)  -continue to monitor.    Anxiety / Depression  -continue Bupropion and Vistaril.  -stop drinking.    Diabetes with peripheral neuropathy  -continue Lantus 25 units nightly.  -started on lispro 3 units before meals.  -continue sliding scale insulin protocol.  -continue Neurontin but at reduced dosage due to CKD stage 3.   -stop drinking.      Alcohol use disorder, recurrent severe  -drinks 3-4 beers daily now but admits to drinking hard liquor recently in cutting down.  -he denies any history of delirium tremens or withdrawal seizures.  -2 alcohol-related arrests but does not consider his drinking a  problem.  -continue thiamine.  -stop drinking.    Physical deconditioning  -PT OT recommending skilled nursing facility.  Patient will be ready for discharge in the next few days.  -stop drinking.    GERD  -continue famotidine.  -stop drinking.    DVT/PE Prophylaxis: Heparin    Disposition:  PT OT recommending skilled nursing facility. Will be ready for discharge once wound vac is out.    Howell Rucks, MD 04/06/2020 07:16  Hospitalist  Department of Internal Medicine  [facility] of Medicine

## 2020-04-07 LAB — MANUAL DIFF AND MORPHOLOGY-SYSMEX
BASOPHIL #: <0.1 10*3/uL (ref <=0.20)
BASOPHIL %: 0 %
EOSINOPHIL #: 0.12 10*3/uL (ref <=0.50)
EOSINOPHIL %: 1 %
LYMPHOCYTE #: 0.98 10*3/uL — ABNORMAL LOW (ref 1.00–4.80)
LYMPHOCYTE %: 8 %
METAMYELOCYTE %: 2 %
MONOCYTE #: 0.49 10*3/uL (ref 0.20–1.10)
MONOCYTE %: 4 %
NEUTROPHIL #: 10.37 10*3/uL — ABNORMAL HIGH (ref 1.50–7.70)
NEUTROPHIL %: 85 %
NRBC FROM MANUAL DIFF: 0 per 100 WBC
RBC MORPHOLOGY: NORMAL

## 2020-04-07 LAB — CBC WITH DIFF
HCT: 24 % — ABNORMAL LOW (ref 38.9–52.0)
HGB: 7.6 g/dL — ABNORMAL LOW (ref 13.4–17.5)
MCH: 29.2 pg (ref 26.0–32.0)
MCHC: 31.7 g/dL (ref 31.0–35.5)
MCV: 92.3 fL (ref 78.0–100.0)
MPV: 10.2 fL (ref 8.7–12.5)
PLATELETS: 305 10*3/uL (ref 150–400)
RBC: 2.6 10*6/uL — ABNORMAL LOW (ref 4.50–6.10)
RDW-CV: 15.3 % (ref 11.5–15.5)
WBC: 12.2 10*3/uL — ABNORMAL HIGH (ref 3.7–11.0)

## 2020-04-07 LAB — OR CULTURE(AEROBIC AND GRAM STAIN)
FLC: NO GROWTH
FLC: NO GROWTH
FLC: NO GROWTH
FLC: NO GROWTH
FLC: NO GROWTH
FLC: NO GROWTH
GRAM STAIN: NONE SEEN
GRAM STAIN: NONE SEEN
GRAM STAIN: NONE SEEN
GRAM STAIN: NONE SEEN
GRAM STAIN: NONE SEEN
GRAM STAIN: NONE SEEN

## 2020-04-07 LAB — BASIC METABOLIC PANEL
ANION GAP: 2 mmol/L — ABNORMAL LOW (ref 4–13)
BUN/CREA RATIO: 12 (ref 6–22)
BUN: 17 mg/dL (ref 8–25)
CALCIUM: 8.7 mg/dL — ABNORMAL LOW (ref 8.8–10.2)
CHLORIDE: 109 mmol/L (ref 96–111)
CO2 TOTAL: 22 mmol/L — ABNORMAL LOW (ref 23–31)
CREATININE: 1.43 mg/dL — ABNORMAL HIGH (ref 0.75–1.35)
ESTIMATED GFR: 53 mL/min/BSA — ABNORMAL LOW (ref >=60)
GLUCOSE: 143 mg/dL — ABNORMAL HIGH (ref 65–125)
POTASSIUM: 4.5 mmol/L (ref 3.5–5.1)
SODIUM: 133 mmol/L — ABNORMAL LOW (ref 136–145)

## 2020-04-07 LAB — POC BLOOD GLUCOSE (RESULTS)
GLUCOSE, POC: 153 mg/dL — ABNORMAL HIGH (ref 70–105)
GLUCOSE, POC: 145 mg/dL — ABNORMAL HIGH (ref 70–105)
GLUCOSE, POC: 163 mg/dL — ABNORMAL HIGH (ref 70–105)
GLUCOSE, POC: 166 mg/dL — ABNORMAL HIGH (ref 70–105)

## 2020-04-07 LAB — BLOOD CULTURE (DETERMINE LINE SEPSIS)
BLOOD CULTURE, LINE SEPSIS: ABNORMAL — CR
BLOOD CULTURE, LINE SEPSIS: NO GROWTH
BLOOD CULTURE, LINE SEPSIS: NO GROWTH

## 2020-04-07 LAB — PHOSPHORUS: PHOSPHORUS: 3.1 mg/dL (ref 2.3–4.0)

## 2020-04-07 LAB — MAGNESIUM: MAGNESIUM: 1.5 mg/dL — ABNORMAL LOW (ref 1.8–2.6)

## 2020-04-07 MED ORDER — OXYCODONE 5 MG TABLET
15.0000 mg | ORAL_TABLET | ORAL | 0 refills | Status: DC | PRN
Start: 2020-04-07 — End: 2023-03-12

## 2020-04-07 MED ORDER — GABAPENTIN 300 MG CAPSULE
600.0000 mg | ORAL_CAPSULE | Freq: Three times a day (TID) | ORAL | 0 refills | Status: DC
Start: 2020-04-07 — End: 2022-02-06

## 2020-04-07 MED ORDER — METRONIDAZOLE 500 MG TABLET
500.0000 mg | ORAL_TABLET | Freq: Three times a day (TID) | ORAL | 0 refills | Status: DC
Start: 2020-04-07 — End: 2020-05-22

## 2020-04-07 NOTE — Consults (Signed)
[facility]  Service: Oncology  Attending: Enis Slipper  Progress Note  04/07/2020    Name: Bennet Kujawa  DOB: Mar 07, 1961  MRN: [id number]    RECENT ORTHO SURGERY:  - I&D R shoulder = 7/25 (Attending Surgeon - Mendel Ryder)    SUBJECTIVE:  59 y.o. male resting in bed. Complains of some shoulder pain. Inadvertently removed the remaining drain last night however it was down trending.     OBJECTIVE:  AF, VSS  BP (!) 161/81   Pulse 88   Temp 36.8 C (98.2 F)   Resp 16   Ht 1.803 m (5' 10.98")   Wt 83.6 kg (184 lb 4.9 oz)   SpO2 99%   BMI 25.72 kg/m         GEN - NAD,  resting in bed  MSK RUE:  Inspection- Hemovac output 2.5/10/7.5 last 3 8 hours shifts prior to removal  Motor- weak thumbs up, okay sign, abducts fingers  Sensation- SILT r/u/m  Vasc- BCR fingertips      ASSESSMENT:  59 y.o. male 5 Days Post-Op s/p I&D R shoulder    PLAN:  -PICC line in place  - Weightbearing: NWB RUE. Maintain brace for now  - PT/OT: ordered. SNF. Please have patient come out of brace to work on elbow ROM  - DVT prophylaxis: heparin  - Antibiotics: Dapto, Cefepime, Flagyl  - Cx: No initial growth  - Pain: per primary  - Drain:none left in place  - Dressing: plan to change tomorrow  - Nursing instructions: mobilize  - Dispo: pending Abx  - Follow-up: will see back in Dr. Kathrin Greathouse clinic 2 weeks    Verlon Au, MD  Resident, PGY-3  Department of Orthopaedics  Pager [phone]  04/07/2020 06:12

## 2020-04-07 NOTE — Care Management Notes (Signed)
[facility] Management Note    Patient Name: [name] [name]  Date of Birth: 09/08/1961  Sex: male  Date/Time of Admission: 04/01/2020 12:23 PM  Room/Bed: 04/A  Payor: [place] MEDICARE / Plan: [place] MEDICARE ADVANTAGE PPO / Product Type: PPO /    LOS: 6 days   Primary Care Providers:  [facility] [place] (General)    Admitting Diagnosis:  Abscess of right shoulder [L02.413]    Assessment:      04/07/20 1622   Assessment Details   Assessment Type Continued Assessment   Date of Care Management Update 04/07/20   Date of Next DCP Update 04/10/20   Medicare Intent to Discharge Documentation   Discharge IMM give to: Patient   Discharge IMM Letter Given Date 04/07/20   Discharge IMM Letter Given Time 1615   IMM explained/reviewed with:  Patient;verbalized understanding   Care Management Plan   Discharge Planning Status plan in progress   Projected Discharge Date 04/10/20   Discharge plan discussed with: Patient   Patient choice offered to patient/family yes   Form for patient choice reviewed/signed and on chart yes   Facility or [facility]   Discharge Needs Assessment   Discharge Facility/Level of Care Needs SNF Placement (Medicare certified)(code 3)       Discharge Plan:  SNF Placement (Medicare certified) (code 3)  Pt accepted by [facility] per Helene Kelp, AM/FM liaison.  They have submitted for auth.  IMM and choice form reviewed with patient.      The patient will continue to be evaluated for developing discharge needs.     Case Manager: Alonza Smoker, [place] COORDINATOR  Phone: [phone]

## 2020-04-07 NOTE — Care Plan (Signed)
[facility]  Occupational Therapy Progress Note    Patient Name: [name] [name]  Date of Birth: 08-29-1961  Height:  180.3 cm (5' 10.98")  Weight:  83.6 kg (184 lb 4.9 oz)  Room/Bed: 04/A  Payor: [place] MEDICARE / Plan: [place] MEDICARE ADVANTAGE PPO / Product Type: PPO /     Assessment:    [name] [name] performed fairly during OT intervention this date. Pt's mobility and activity tolerance continue to be limited by functional weakness and physical deconditioning, severe balance deficits, difficulty motor planning, and decreased functional use of RUE. Following transfer towards EOB with SBA provided for safety, completed AAROM of R elbow to end range 1x15 reps. Pt completed functional STS transfers, SPT from EOB to bedside recliner chair, and ambulation of 20 feet x1, 10 feet x1 within parallel bars with mod-max A x1-2 to maintain balance, steadying, safety with c/o worsening pain and dizziness. From OT perspective, pt continues to be most appropriate for SNF placement upon d/c once medically appropriate. OT will continue to follow.      Discharge Needs:   Equipment Recommendation: to be determined    Discharge Disposition:  skilled nursing facility     JUSTIFICATION OF DISCHARGE RECOMMENDATION   Based on current diagnosis, functional performance prior to admission, and current functional performance, this patient requires continued OT services in skilled nursing facility in order to achieve significant functional improvements.    Plan:   Continue to follow patient according to established plan of care.  The risks/benefits of therapy have been discussed with the patient/caregiver and he/she is in agreement with the established plan of care.     Subjective & Objective:      04/07/20 1142   Therapist Pager   OT Assigned/ Pager # Venecia Mehl [phone]   Rehab Session   Document Type therapy progress note (daily note)   Total OT Minutes: 21   Patient Effort good   Symptoms Noted During/After Treatment  fatigue;dizziness   General Information   Patient Profile Reviewed yes   Onset of Illness/Injury or Date of Surgery 04/01/20   Medical Lines PICC Line   Respiratory Status room air   Existing Precautions/Restrictions full code;fall precautions;weight bearing restriction   Pre Treatment Status   Pre Treatment Patient Status Patient supine in bed;Call light within reach;Telephone within reach;Sitter select activated;Nurse approved session   Support Present Pre Treatment  Clinical assistant present   Communication Pre Treatment  Nurse   Mutuality/Individual Preferences   Individualized Care Needs OOB to chair via Denna Haggard and assist x2; NWB RUE   Patient-Specific Goals (Include Timeframe) Pain control   Plan of Care Reviewed With patient   Vital Signs   O2 Delivery Pre Treatment room air   O2 Delivery Post Treatment room air   Pain Assessment   Pretreatment Pain Rating 8/10   Posttreatment Pain Rating 8/10   Pain Location - Side Right   Pain Location - Orientation upper   Pain Location extremity   Coping/Psychosocial   Observed Emotional State calm;cooperative   Verbalized Emotional State acceptance   Coping/Psychosocial Response Interventions   Plan Of Care Reviewed With patient   Cognitive Assessment/Interventions   Behavior/Mood Observations alert;cooperative;distractible   Orientation Status oriented to;person;place   Attention mild impairment;distractible   Follows Commands follows two step commands   Mobility Assessment/Training   Mobility Comment Pt ambulated within parallel bars 20 feet x1, 10 feet x1 with max A to maintain balance, steadying, safety.   Right Upper  Extremity non weight-bearing (NWB)   Bed Mobility Assessment/Treatment   Bed Mobility, Assistive Device Head of Bed Elevated   Supine-Sit [place] stand-by assistance   Sit to Supine, Independence not tested   Safety Issues decreased use of arms for pushing/pulling   Impairments balance impaired;coordination impaired;endurance;flexibility  decreased;pain;ROM decreased;strength decreased   Transfer Assessment/Treatment   Sit-Stand [place] minimum assist (75% patient effort);2 person assist required   Stand-Sit [place] minimum assist (75% patient effort);2 person assist required   Sit-Stand-Sit, Assist Device side by side   Bed-Chair [place] moderate assist (50% patient effort)   Bed-Chair-Bed Assist Device side by side   Transfer Impairments balance impaired;coordination impaired;endurance;flexibility decreased;pain;ROM decreased;strength decreased   ADL Assessment/Intervention   ADL Comments Seated UB grooming ADLs such as self-feeding, grooming with setup A and SPV provided for safety. Dynamic ADL routine continues to be limited by significant balance impairments and decreased flexibility.   Balance Skill Training   Sitting Balance: Static good balance   Sitting, Dynamic (Balance) fair balance   Sit-to-Stand Balance fair - balance   Standing Balance: Static fair - balance   Standing Balance: Dynamic poor + balance   Systems Impairment Contributing to Balance Disturbance neuromuscular;musculoskeletal   Therapeutic Exercise/Activity   Comment Worked on elbow ROM while seated EOB 1x15 reps. Able to achieve full elbow extension via AAROM and cues.   Functional Endurance Training   Comment, Functional Endurance improving   Orthotics/Misc Device    Shoulder Brace On;ARC Shoulder Brace   Clinical Impression   Functional Level at Time of Session [name] [name] performed fairly during OT intervention this date. Pt's mobility and activity tolerance continue to be limited by functional weakness and physical deconditioning, severe balance deficits, difficulty motor planning, and decreased functional use of RUE. Following transfer towards EOB with SBA provided for safety, completed AAROM of R elbow to end range 1x15 reps. Pt completed functional STS transfers, SPT from EOB to bedside recliner chair, and ambulation of 20 feet x1, 10 feet x1 within  parallel bars with mod-max A x1-2 to maintain balance, steadying, safety with c/o worsening pain and dizziness. From OT perspective, pt continues to be most appropriate for SNF placement upon d/c once medically appropriate. OT will continue to follow.   Anticipated Equipment Needs at Discharge to be determined   Anticipated Discharge Disposition skilled nursing facility   Highest level of Mobility score   Exercise/Activity Level Performed 6- Walked 10 steps or more       Therapist:   Evolet Salminen, MOT, OTR/L  Pager #: [phone]

## 2020-04-07 NOTE — Discharge Summary (Addendum)
[facility]  DISCHARGE SUMMARY    PATIENT NAME:  [name] [name], [name] [name]  MRN:  [id number]  DOB:  1961-01-31    ENCOUNTER DATE:  04/01/2020  INPATIENT ADMISSION DATE: 04/01/2020  DISCHARGE DATE:  04/10/2020    ATTENDING PHYSICIAN: Celene Skeen, MD  SERVICE: HOSPITALIST 11  PRIMARY CARE PHYSICIAN: [facility]     Has PCP been verified with patient and updated? Yes    LAY CAREGIVER:  ,  ,        PRIMARY DISCHARGE DIAGNOSIS: Abscess of right shoulder  Active Hospital Problems    Diagnosis Date Noted    Principle Problem: Abscess of right shoulder [L02.413] 04/01/2020      Resolved Hospital Problems   No resolved problems to display.     Active Non-Hospital Problems    Diagnosis Date Noted    Preseptal cellulitis of left eye 08/12/2019    Sepsis due to methicillin resistant Staphylococcus aureus (MRSA) without acute organ dysfunction (CMS HCC) 08/12/2019    Abscess of left periorbital region 08/11/2019        DISCHARGE MEDICATIONS:     Current Discharge Medication List      START taking these medications.      Details   gabapentin 300 mg Capsule  Commonly known as: NEURONTIN  Replaces: Neurontin 600 mg Tablet   600 mg, Oral, 3 TIMES DAILY  Qty: 90 Capsule  Refills: 0     metroNIDAZOLE 500 mg Tablet  Commonly known as: FLAGYL   500 mg, Oral, 3 TIMES DAILY  Qty: 111 Tablet  Refills: 0     oxyCODONE 5 mg Tablet  Commonly known as: ROXICODONE   15 mg, Oral, EVERY 4 HOURS PRN  Qty: 10 Tablet  Refills: 0     polyethylene glycol 17 gram Powder in Packet  Commonly known as: MIRALAX   17 g, Oral, DAILY  Refills: 0     sennosides-docusate sodium 8.6-50 mg Tablet  Commonly known as: SENOKOT-S   2 Tablets, Oral, 2 TIMES DAILY  Refills: 0        CONTINUE these medications - NO CHANGES were made during your visit.      Details   famotidine 40 mg Tablet  Commonly known as: PEPCID   40 mg, Oral, EVERY EVENING  Refills: 0     insulin glargine 100 unit/mL injection (vial)   25 Units, Subcutaneous, NIGHTLY  Refills: 0     insulin  lispro 100 unit/mL Solution  Commonly known as: HUMALOG   5 Units, Subcutaneous, 3 TIMES DAILY BEFORE MEALS  Refills: 0        STOP taking these medications.    Neurontin 600 mg Tablet  Generic drug: gabapentin  Replaced by: gabapentin 300 mg Capsule        ASK your doctor about these medications.      Details   naloxone 4 mg/actuation Spray, Non-Aerosol  Commonly known as: NARCAN  Ask about: Should I take this medication?   1 Spray, INTRANASAL, ONCE, As directed for opioid overdose.  Call 911 after administration.  Qty: 1 Each  Refills: 0          Discharge med list refreshed?  YES     During this hospitalization did the patient have an AMI, PCI/PCTA, STENT or Isolated CABG?  No                            ALLERGIES:  No Known Allergies  HOSPITAL PROCEDURE(S):   Bedside Procedures:  No orders of the defined types were placed in this encounter.    Surgical Procedure(s):  Right - IRRIGATION AND DEBRIDEMENT SHOULDER - Wound Class: Dirty or Infected Wounds-Include old traumatic wounds  Right - APPLICATION WOUND VAC - Wound Class: Dirty or Infected Wounds-Include old traumatic wounds    [place]     Armari Fussell is a 59 y.o., white male with a history of anxiety, depression, diabetes with peripheral neuropathy, GERD, and diabetic foot wounds admitted as a direct admission from [facility] for 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  -orthopedics performed surgery  on 04/02/20.  Wound vac out on 04/06/2020  -on Dapto, cefepime, and Flagyl with end date 05/14/20. OPAT complete. Has PICC.   -Oxycodone 15 mg Q4H PRN.   -1 culture was positive here but was contaminated.  No need to remove  PICC line.    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  Advanced wound consulted.    -patient declined podiatry consult as he already follows with podiatrist outpatient.  -patient has known osteo on the left heel but wound appears to be well taken care of.  -patient was switched to daptomycin, cefepime, and Flagyl with a new end date of 05/14/20.   -stop drinking.    Thrombocytosis (RESOLVED)  -reactive and now resolving after surgery.  -stop drinking.    Anemia of chronic disease  -transfuse 2 units of blood on 04/01/20  -stop drinking.    Severe protein calorie malnutrition  -albumin very low likely from chronic inflammatory state and alcoholism.  -nutrition has been consulted.  -stop drinking.    CKD stage III  -continue to monitor.  Avoid nephrotoxic medication.  -stop drinking.    Hyperkalemia (RESOLVED)  -continue to monitor.    Anxiety / Depression  -continue  and Vistaril.  -stop drinking.    Diabetes with peripheral neuropathy  -continue Lantus 25 units nightly.  -started on lispro 3 units before meals.  -continue sliding scale insulin protocol.  -continue Neurontin but at reduced dosage due to CKD stage 3.   -stop drinking.      Alcohol use disorder, recurrent severe  -drinks 3-4 beers daily now but admits to drinking hard liquor recently in cutting down.  -he denies any history of delirium tremens or withdrawal seizures.  -2 alcohol-related arrests but does not consider his drinking a problem.  -continue thiamine.  -stop drinking.    Physical deconditioning  -PT OT recommending skilled nursing facility.  Patient ready for discharge today  -stop drinking.    GERD  -continue famotidine.  -stop drinking.    Outpatient Antimicrobial Treatment Plan       Diagnosis Group:  Diagnosis:   Microorganisms:   Bone and joint infection Right shoulder abscess and associated Osteomyelitis           Antibiotics  IV Antibiotics:  Dose  Frequency Anticipated  Stop date:  Comments:   Daptomycin 450 mg Every 24 hours  05/14/2020  6 mg/kg   Cefepime 2g Every 12 hours 05/14/2020    Oral Antibiotics:  Dose Frequency Anticipated Stop date:  Comment:   Metronidazole 500 mg Every 8 hours 05/14/2020    6  weeks from date of surgery (04/02/2020)    Important drug interactions:      Allergies:   No Known Allergies         Labs monitoring plan/orders  Lab Lab Frequency Additional Comments   CBC/diff, BUN, Creatinine, Hepatic function panel, Inflammatory CRP and CK level , ESR  Every Monday        OPAT Pharmacist:  Dr. Thomasene Ripple  Phone Number: [phone] Ext: [phone]  Fax Labs To: [phone]     Physician Monitoring OPAT Course:   Dr. Charyl Dancer Roidad  Phone Number: [phone]  Fax Labs To: [phone]       ID follow-up:   ID/Ortho follow up 2 weeks post discharge            CONDITION ON DISCHARGE:  A. Ambulation: Ambulation with assistive device  B. Self-care Ability: With partial assistance  C. Cognitive Status Alert and Oriented x 3  D. Code status at discharge:   Code Status Information     Code Status    Full Code                 LINES/DRAINS/WOUNDS AT DISCHARGE:   Patient Lines/Drains/Airways Status    Active Line / Dialysis Catheter / Dialysis Graft / Drain / Airway / Wound     Name: Placement date: Placement time: Site: Days:    PICC Double Lumen Left;Basilic Vein  --   --   --      Hemovac Right;Upper;Medial Back  --   --       Wound (Non-Surgical) Left Heel  08/11/19   --   243    Wound (Non-Surgical) Right Heel  08/10/19   --   244    Wound (Non-Surgical) Left Eye  08/11/19   --   243    Surgical Incision Lateral;Right Shoulder  04/02/20   --   8                DISCHARGE DISPOSITION:  Skilled Nursing Unit              DISCHARGE INSTRUCTIONS:  Follow-up Information     [facility] .    Specialty: Orthopaedics  Contact information:  [place] [address]  [phone]  Additional  information:  [facility] [place] are currently suspended due to COVID-19 restrictions at this [place] outpatient clinic. We apologize for any inconvenience this may cause. Please ask an attendant for assistance if needed.           [facility] .    Specialty: Infectious Diseases  Contact information:  [place] [address]  [phone]  Additional information:  [facility] [place] are currently suspended due to COVID-19 restrictions at this [place] outpatient clinic. We apologize for any inconvenience this may cause. Please ask an attendant for assistance if needed.                  DISCHARGE INSTRUCTION - MISC    - Weightbearing Status: NWB RUE    - What Does This Weightbearing Mean?: This means you may not lift weight on the operative or injured extremity. You should also bend/straighten your elbow several times throughout the day to prevent stiffness of these joints.    - Dressing Instructions: You may perform your first dressing change the  day after discharge. After that, you may change dressing every day. Use regular gauze and paper tape that you can purchase at any local pharmacy. Keep the incision clean and dry. Do not soak in water until it heals. After the incision has been dry for two consecutive days (no drainage on the dressing when you change it), then you may start showering. It's OK for soap/water to run over the area then, but avoid direct rubbing/washing. Avoid any ponds, lakes, swimming pools, or hot tubs. If you have steri-strips in place (small, white sticky bandages across wound), do not remove. They will fall off on their own.    - Warnings: Drainage from the incision that lasts more than a week after surgery may be a sign of infection and should be reported to your surgeon right away. Fever, shaking chills or sweats may be a sign of a  surgical site infection and should be reported to your surgeon immediately. Swelling and bruising of the limb is quite common after surgery especially after discharge as you will be more active at home. Swelling that does not go away with elevation for at least an hour and swelling associated with groin, thigh or calf pain may be a sign of a blood clot, also known as DVT or deep vein thrombosis. DVT is dangerous as the clot can break free and travel to the lung.  If you have swelling that does not go away with elevation or swelling associated with pain, contact your surgeon immediately. If you notice this after hours or on weekends, go to the nearest emergency room or hospital. Smoking increases the risk of fracture non-union, surgical site infection, and blood clots.  It would be wise to cease smoking altogether or at least until the wound and fracture are healed. Chest pain or shortness of breath can be a sign of a heart attack or pulmonary embolus (a blood clot that travels to the lung).  If this occurs, call 911 immediately as this is an emergency.    - Follow-up Appointments: If your surgery was elective, a follow-up appointment with your surgeon may have been scheduled prior to admission. If not, then a follow-up appointment was ordered for you upon discharge by the doctors. If you are not contacted by your surgeon's office within two days of discharge, then please contact the orthopaedic clinic at [phone]. Also, we highly recommend that you contact your primary care doctor (family doctor) after discharge to update them on your health status.     [place]     Follow-up in: 2 WEEKS    Reason for visit: POST-OP VISIT    Follow-up reason: I&D R shoulder    Provider: Cornell Barman      SCHEDULE FOLLOW-UP - INFECTIOUS DISEASE - [place]    Please schedule with Ortho ID (lastinger, bryan, or reece) 2 weeks after discharge and  coordinate with Ortho follow-up with Dr. Mendel Ryder     Follow-up in: OTHER    Other, Please Specify: 2 weeks after discharge, coordinate with dr. Mendel Ryder    Reason for visit: HOSPITAL DISCHARGE    Follow-up reason: osteomyelitis of right shoulder                 Celene Skeen, MD    Copies sent to Care Team       Relationship Specialty Notifications [place], Morton PCP - General EXTERNAL  08/17/19  Verified by transitions team on 08/17/19 TS    Phone: [phone] Fax: [phone]         [address] [place] [address]            Referring providers can utilize [email or web address] to access their referred [place] patient's information.

## 2020-04-07 NOTE — Progress Notes (Signed)
[facility]  General Medicine  Progress note    Date of Service:  04/07/2020  [name] [name], 59 y.o. male  Date of Admission:  04/01/2020  Date of Birth:  Feb 03, 1961  PCP: [facility]  CC: pyomyositis, osteomyelitis  Subjective:  Patient is doing better today. "Don't rush me out of here" " I would like to go somewhere near [place] [employer].  No F/C/CP/SOB/N/V/abdominal pain ,diarrhea, constipation or any urinary tract symptoms.    I discussed plan of care with patient  - Patient appropriate and ready for SNF  - Care management has already started working on placement.    Objective:  I reviewed all vital signs.  Examination:    Temperature: 36.8 C (98.2 F) Heart Rate: 88 BP (Non-Invasive): (!) 161/81   Respiratory Rate: 16 SpO2: 99 %       Physical Exam:   General:Cooperative male in no acute distress. VS reviewed.    Lungs: Clear to auscultation bilaterally. No crackles, rales or wheezing   Cardiovascular: regular rate and rhythm, S1, S2 normal, no murmur, click, rub or gallop   Abdomen: Soft, non-tender, Bowel sounds normal, non-distended, No hepatosplenomegaly   Extremities: No cyanosis or edema; Drains from right shoulder, device on shoulder  Neurologic: Alert and oriented x3      Labs: I have reviewed all lab results.  Lab Results Today:    Results for orders placed or performed during the hospital encounter of 04/01/20 (from the past 24 hour(s))   POC BLOOD GLUCOSE (RESULTS)   Result Value Ref Range    GLUCOSE, POC 126 (H) 70 - 105 mg/dl   POC BLOOD GLUCOSE (RESULTS)   Result Value Ref Range    GLUCOSE, POC 163 (H) 70 - 105 mg/dl   POC BLOOD GLUCOSE (RESULTS)   Result Value Ref Range    GLUCOSE, POC 193 (H) 70 - 105 mg/dl   BASIC METABOLIC PANEL   Result Value Ref Range    SODIUM 133 (L) 136 - 145 mmol/L    POTASSIUM 4.5 3.5 - 5.1 mmol/L    CHLORIDE 109 96 - 111 mmol/L    CO2 TOTAL 22 (L) 23 - 31 mmol/L    ANION GAP 2 (L) 4 - 13 mmol/L    CALCIUM 8.7 (L) 8.8 - 10.2 mg/dL    GLUCOSE  143 (H) 65 - 125 mg/dL    BUN 17 8 - 25 mg/dL    CREATININE 1.43 (H) 0.75 - 1.35 mg/dL    BUN/CREA RATIO 12 6 - 22    ESTIMATED GFR 53 (L) >=60 mL/min/BSA   MAGNESIUM   Result Value Ref Range    MAGNESIUM 1.5 (L) 1.8 - 2.6 mg/dL   PHOSPHORUS   Result Value Ref Range    PHOSPHORUS 3.1 2.3 - 4.0 mg/dL   CBC WITH DIFF   Result Value Ref Range    WBC 12.2 (H) 3.7 - 11.0 x103/uL    RBC 2.60 (L) 4.50 - 6.10 x106/uL    HGB 7.6 (L) 13.4 - 17.5 g/dL    HCT 24.0 (L) 38.9 - 52.0 %    MCV 92.3 78.0 - 100.0 fL    MCH 29.2 26.0 - 32.0 pg    MCHC 31.7 31.0 - 35.5 g/dL    RDW-CV 15.3 11.5 - 15.5 %    PLATELETS 305 150 - 400 x103/uL    MPV 10.2 8.7 - 12.5 fL   MANUAL DIFF AND MORPHOLOGY-SYSMEX   Result Value Ref Range  NEUTROPHIL % 85 %    LYMPHOCYTE %  8 %    MONOCYTE % 4 %    EOSINOPHIL % 1 %    BASOPHIL % 0 %    METAMYELOCYTE %  2 %    NEUTROPHIL # 10.37 (H) 1.50 - 7.70 x103/uL    LYMPHOCYTE # 0.98 (L) 1.00 - 4.80 x103/uL    MONOCYTE # 0.49 0.20 - 1.10 x103/uL    EOSINOPHIL # 0.12 <=0.50 x103/uL    BASOPHIL # <0.10 <=0.20 x103/uL    NRBC FROM MANUAL DIFF 0 per 100 WBC    RBC MORPHOLOGY Normal RBC and PLT Morphology    POC BLOOD GLUCOSE (RESULTS)   Result Value Ref Range    GLUCOSE, POC 153 (H) 70 - 105 mg/dl     DNR Status:  Full Code    Assessment/Plan:   Active Hospital Problems    Diagnosis    Primary Problem: Abscess of right shoulder     [name] [name] is a 59 y.o., white male with a history of anxiety, depression, diabetes with peripheral neuropathy, GERD, and diabetic foot wounds admitted as a direct admission from [facility] for 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    Streptococcus sepsis / right shoulder osteomyelitis / right shoulder pyomyositis  -patient underwent multiple surgeries at [facility] and was transferred here for further debridement.  -orthopedics performed surgery  on 04/02/20.   Wound vac out.  -on Dapto, cefepime, and Flagyl with end date 05/14/20. OPAT complete. Has PICC.   -Oxycodone 15 mg Q4H PRN.   -1 culture was positive here but was contaminated.  No need to remove PICC line.    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  Advanced wound consulted.    -patient declined podiatry consult as he already follows with podiatrist outpatient.  -patient has known osteo on the left heel but wound appears to be well taken care of.  -patient was switched to daptomycin, cefepime, and Flagyl with a new end date of 05/14/20.   -stop drinking.    Thrombocytosis (RESOLVED)  -reactive and now resolving after surgery.  -stop drinking.    Anemia of chronic disease  -transfuse 2 units of blood on 04/01/20  -stop drinking.    Severe protein calorie malnutrition  -albumin very low likely from chronic inflammatory state and alcoholism.  -nutrition has been consulted.  -stop drinking.    CKD stage III  -continue to monitor.  Avoid nephrotoxic medication.  -stop drinking.    Hyperkalemia (RESOLVED)  -continue to monitor.    Anxiety / Depression  -continue  and Vistaril.  -stop drinking.    Diabetes with peripheral neuropathy  -continue Lantus 25 units nightly.  -started on lispro 3 units before meals.  -continue sliding scale insulin protocol.  -continue Neurontin but at reduced dosage due to CKD stage 3.   -stop drinking.      Alcohol use disorder, recurrent severe  -drinks 3-4 beers daily now but admits to drinking hard liquor recently in cutting down.  -he denies any history of delirium tremens or withdrawal seizures.  -2 alcohol-related arrests but does not consider his drinking a problem.  -continue thiamine.  -stop drinking.    Physical deconditioning  -PT OT recommending skilled nursing facility.  Patient will be ready for discharge in the next few days.  -stop drinking.    GERD  -continue famotidine.  -stop  drinking.    DVT/PE Prophylaxis:  Heparin    Disposition:  Wound vac out, ready for SNF placement. CM aware    Howell Rucks, MD 04/07/2020 07:13  Hospitalist  Department of Internal Medicine  [facility]

## 2020-04-07 NOTE — Care Plan (Signed)
[facility]  Physical Therapy Progress Note      Patient Name: [name] [name]  Date of Birth: [birth year]  Height:  180.3 cm (5' 10.98")  Weight:  83.6 kg (184 lb 4.9 oz)  Room/Bed: 04/A  Payor: [place] MEDICARE / Plan: [place] MEDICARE ADVANTAGE PPO / Product Type: PPO /     Assessment:     Pt tolerated session well.  Increased activity noted.  Ambulated with assistance in // bars.  Worked on standing balance.  Pt did have some dizziness, but resolved with time.  Recommend SNF at d/c.  Pt needs hands on assistance for transfers and gait.    Discharge Needs:   Equipment Recommendation: TBD      Discharge Disposition: skilled nursing facility  JUSTIFICATION OF DISCHARGE RECOMMENDATION   Based on current diagnosis, functional performance prior to admission, and current functional performance, this patient requires continued PT services in skilled nursing facility in order to achieve significant functional improvements in these deficit areas: aerobic capacity/endurance, gait, locomotion, and balance, integumentary integrity. The above recommendation is based upon the current examination and evaluation performed on this date. As subsequent sessions are completed, recommendations will be updated accordingly.      Plan:   Continue to follow patient according to established plan of care.  The risks/benefits of therapy have been discussed with the patient/caregiver and he/she is in agreement with the established plan of care.     Subjective & Objective:        04/07/20 1143   Therapist Pager   PT Assigned/ Pager # Kelli Egolf [phone]   Rehab Session   Document Type therapy progress note (daily note)   Total PT Minutes: 23   Patient Effort good   Symptoms Noted During/After Treatment fatigue;dizziness   General Information   Medical Lines PICC Line   Respiratory Status room air   Existing Precautions/Restrictions full code;fall precautions;weight bearing restriction   General Observations: Pt reclined in bed.   Agreeable to PT session.  Seen with OT service   Weight-bearing Status   Right Upper Extremity non weight-bearing (NWB)   Mutuality/Individual Preferences   Individualized Care Needs OOB to chai with sara stedy and assist x 2   Patient-Specific Goals (Include Timeframe) Pain control   Plan of Care Reviewed With patient   Pre Treatment Status   Pre Treatment Patient Status Patient supine in bed;Call light within reach;Telephone within reach;Sitter select activated   Support Present Pre Treatment  None   Communication Pre Treatment  Nurse   Communication Pre Treatment Comment aware of session   Pain Assessment   Pretreatment Pain Rating 8/10   Posttreatment Pain Rating 8/10   Pain Location - Side Right   Pain Location - Orientation upper   Pain Location extremity   Bed Mobility Assessment/Treatment   Bed Mobility, Assistive Device Head of Bed Elevated   Supine-Sit Independence supervision required   Safety Issues decreased use of arms for pushing/pulling   Impairments balance impaired;coordination impaired;pain;ROM decreased;strength decreased   Transfer Assessment/Treatment   Sit-Stand Independence minimum assist (75% patient effort);2 person assist required   Stand-Sit Independence minimum assist (75% patient effort);2 person assist required   Sit-Stand-Sit, Assist Device handheld assist   Bed-Chair Independence moderate assist (50% patient effort)   Transfer Impairments balance impaired;coordination impaired;endurance;flexibility decreased;pain;strength decreased;ROM decreased   Gait Assessment/Treatment   Total Distance Ambulated 30   Independence  maximum assist (25% patient effort)   Assistive Device  parallel bars   Distance  in Feet 20 and 10   Gait Speed decreased   Deviations  cadence decreased;increased trunk sway;weight-shifting ability decreased;step length decreased   Safety Issues  balance decreased during turns;sequencing ability decreased;step length decreased;weight-shifting ability decreased;loses  balance backward   Impairments  balance impaired;coordination impaired;endurance;pain;ROM decreased;strength decreased   Balance Skill Training   Sitting Balance: Static good balance   Sitting, Dynamic (Balance) fair balance   Sit-to-Stand Balance fair - balance   Standing Balance: Static fair - balance   Standing Balance: Dynamic poor + balance   Systems Impairment Contributing to Balance Disturbance neuromuscular;musculoskeletal   Therapeutic Exercise/Activity   Comment see OT note for RUE ROM activities; static standing in // bars to work on balance   Orthotics/Misc Device    Shoulder Brace On;ARC Shoulder Brace   Post Treatment Status   Post Treatment Patient Status Patient sitting in bedside chair or w/c;Call light within reach;Telephone within reach;Consulting civil engineer Treatment Comment aware of performance   Plan of Care Review   Plan Of Care Reviewed With patient   Basic Mobility Am-PAC/6Clicks Score (APPROVED PT Staff and [facility] Nursing ONLY   Turning in bed without bedrails 3   Lying on back to sitting on edge of flat bed 3   Moving to and from a bed to a chair 2   Standing up from chair 2   Walk in room 2   Climbing 3-5 steps with railing 1   6 Clicks Raw Score total 13   Standardized (t-scale) score 33.99   CMS 0-100% Score 57.65   CMS Modifier CK   Patient Mobility Goal (JHHLM) 4- Move to chair 3X/day   Exercise/Activity Level Performed 6- Walked 10 steps or more   Physical Therapy Clinical Impression   Assessment Pt tolerated session well.  Increased activity noted.  Ambulated with assistance in // bars.  Worked on standing balance.  Pt did have some dizziness, but resolved with time.  Recommend SNF at d/c.  Pt needs hands on assistance for transfers and gait.   Anticipated Equipment Needs at Discharge (PT) TBD   Anticipated Discharge Disposition skilled nursing facility       Therapist:   Judie Bonus, PT   Pager #: [phone]

## 2020-04-08 LAB — MAGNESIUM: MAGNESIUM: 1.6 mg/dL — ABNORMAL LOW (ref 1.8–2.6)

## 2020-04-08 LAB — CBC WITH DIFF
BASOPHIL #: <0.1 10*3/uL (ref <=0.20)
BASOPHIL %: 1 %
EOSINOPHIL #: 0.13 10*3/uL (ref <=0.50)
EOSINOPHIL %: 1 %
HCT: 24.8 % — ABNORMAL LOW (ref 38.9–52.0)
HGB: 7.8 g/dL — ABNORMAL LOW (ref 13.4–17.5)
IMMATURE GRANULOCYTE #: 0.33 10*3/uL — ABNORMAL HIGH (ref <0.10)
IMMATURE GRANULOCYTE %: 3 % — ABNORMAL HIGH (ref 0–1)
LYMPHOCYTE #: 1.59 10*3/uL (ref 1.00–4.80)
LYMPHOCYTE %: 15 %
MCH: 29.3 pg (ref 26.0–32.0)
MCHC: 31.5 g/dL (ref 31.0–35.5)
MCV: 93.2 fL (ref 78.0–100.0)
MONOCYTE #: 0.91 10*3/uL (ref 0.20–1.10)
MONOCYTE %: 9 %
MPV: 10 fL (ref 8.7–12.5)
NEUTROPHIL #: 7.72 10*3/uL — ABNORMAL HIGH (ref 1.50–7.70)
NEUTROPHIL %: 71 %
PLATELETS: 287 10*3/uL (ref 150–400)
RBC: 2.66 10*6/uL — ABNORMAL LOW (ref 4.50–6.10)
RDW-CV: 15.8 % — ABNORMAL HIGH (ref 11.5–15.5)
WBC: 10.8 10*3/uL (ref 3.7–11.0)

## 2020-04-08 LAB — ANAEROBIC CULTURE

## 2020-04-08 LAB — POC BLOOD GLUCOSE (RESULTS)
GLUCOSE, POC: 188 mg/dL — ABNORMAL HIGH (ref 70–105)
GLUCOSE, POC: 129 mg/dL — ABNORMAL HIGH (ref 70–105)
GLUCOSE, POC: 106 mg/dL — ABNORMAL HIGH (ref 70–105)
GLUCOSE, POC: 141 mg/dL — ABNORMAL HIGH (ref 70–105)

## 2020-04-08 LAB — BASIC METABOLIC PANEL
ANION GAP: 5 mmol/L (ref 4–13)
BUN/CREA RATIO: 9 (ref 6–22)
BUN: 14 mg/dL (ref 8–25)
CALCIUM: 8.8 mg/dL (ref 8.8–10.2)
CHLORIDE: 111 mmol/L (ref 96–111)
CO2 TOTAL: 22 mmol/L — ABNORMAL LOW (ref 23–31)
CREATININE: 1.48 mg/dL — ABNORMAL HIGH (ref 0.75–1.35)
ESTIMATED GFR: 51 mL/min/BSA — ABNORMAL LOW (ref >=60)
GLUCOSE: 183 mg/dL — ABNORMAL HIGH (ref 65–125)
POTASSIUM: 4.6 mmol/L (ref 3.5–5.1)
SODIUM: 138 mmol/L (ref 136–145)

## 2020-04-08 LAB — PHOSPHORUS: PHOSPHORUS: 3.4 mg/dL (ref 2.3–4.0)

## 2020-04-08 NOTE — Care Plan (Signed)
Patient has been resting between care throughout the day. Patient's pain is being controlled with PRN and scheduled medications. Patient is receiving  IV abx and is tolerating well. Patient is OOB X 2 with a Filiberto Pinks. Patient is on sitter select for safety. Patient's PICC intact and functioning. PT/OT recommending SNF. POC reviewed with patient. Call light within reach at all times. Will continue to monitor patient.        Problem: Adult Inpatient Plan of Care  Goal: Plan of Care Review  Outcome: Ongoing (see interventions/notes)  Goal: Patient-Specific Goal (Individualized)  Outcome: Ongoing (see interventions/notes)  Goal: Absence of Hospital-Acquired Illness or Injury  Outcome: Ongoing (see interventions/notes)  Goal: Optimal Comfort and Wellbeing  Outcome: Ongoing (see interventions/notes)  Goal: Rounds/Family Conference  Outcome: Ongoing (see interventions/notes)    Problem: Infection  Goal: Absence of Infection Signs and Symptoms  Outcome: Ongoing (see interventions/notes)

## 2020-04-08 NOTE — Consults (Cosign Needed)
[facility]  Service: Oncology  Attending: Enis Slipper  Progress Note  04/08/2020    Name: [name] [name]  DOB: February 22, 1961  MRN: [id number]    RECENT ORTHO SURGERY:  - I&D R shoulder = 7/25 (Attending Surgeon - Mendel Ryder)    SUBJECTIVE:  59 y.o. male resting in bed. Plan to possibly discharge patient today.     OBJECTIVE:  AF, VSS  BP (!) 156/75 Comment: RN notified  Pulse 88   Temp 37.2 C (99 F)   Resp 18   Ht 1.803 m (5' 10.98")   Wt 83.6 kg (184 lb 4.9 oz)   SpO2 98%   BMI 25.72 kg/m         GEN - NAD,  resting in bed  MSK RUE:  Motor- weak thumbs up, okay sign, abducts fingers  Sensation- SILT r/u/m  Vasc- BCR fingertips      ASSESSMENT:  59 y.o. male 6 Days Post-Op s/p I&D R shoulder    PLAN:  -PICC line in place  - Weightbearing: NWB RUE. Maintain brace for now  - PT/OT: ordered. SNF. Please have patient come out of brace to work on elbow ROM  - DVT prophylaxis: heparin  - Antibiotics: Dapto, Cefepime, Flagyl  - Cx: No initial growth  - Pain: per primary  - Drain: none left in place  - Dressing: changed this AM   - Nursing instructions: mobilize  - Dispo: pending progress  - Follow-up: will see back in Dr. Kathrin Greathouse clinic 2 weeks    Verlon Au, MD  Resident, PGY-3  Department of Orthopaedics  Pager [phone]  04/08/2020 07:45

## 2020-04-08 NOTE — Progress Notes (Signed)
[facility]  General Medicine  Progress note    Date of Service:  04/08/2020  [name] [name], 59 y.o. male  Date of Admission:  04/01/2020  Date of Birth:  March 07, 1961  PCP: [facility]  CC: pyomyositis, osteomyelitis  Subjective:      Concerned about not having enough pain management once he leaves SNF. Discussed with patient that after course of antibiotics he may have improvement in pain and doctor there will decide how much pain medication (if any) to prescribe. He is agreeable.  No F/C/CP/SOB/N/V/abdominal pain ,diarrhea, constipation or any urinary tract symptoms.    I discussed plan of care with patient  - Patient appropriate and ready for SNF  - Care management has already started working on placement. Awaiting insurance auth. Anticipate Monday or later for discharge. Patient aware    Objective:  I reviewed all vital signs.  Examination:    Temperature: 36.8 C (98.2 F) Heart Rate: 86 BP (Non-Invasive): (!) 150/84 (RN notified)   Respiratory Rate: 18 SpO2: 98 %       Physical Exam:   General:Cooperative male in no acute distress. VS reviewed.  Appears comfortable today.  Lungs: Clear to auscultation bilaterally. No crackles, rales or wheezing   Cardiovascular: regular rate and rhythm, S1, S2 normal, no murmur, click, rub or gallop   Abdomen: Soft, non-tender, Bowel sounds normal, non-distended, No hepatosplenomegaly   Extremities: No cyanosis or edema; Drains from right shoulder, device on shoulder  Neurologic: Alert and oriented x3      Labs: I have reviewed all lab results.  Lab Results Today:    Results for orders placed or performed during the hospital encounter of 04/01/20 (from the past 24 hour(s))   POC BLOOD GLUCOSE (RESULTS)   Result Value Ref Range    GLUCOSE, POC 145 (H) 70 - 105 mg/dl   POC BLOOD GLUCOSE (RESULTS)   Result Value Ref Range    GLUCOSE, POC 163 (H) 70 - 105 mg/dl   POC BLOOD GLUCOSE (RESULTS)   Result Value Ref Range    GLUCOSE, POC 166 (H) 70 - 105 mg/dl   POC  BLOOD GLUCOSE (RESULTS)   Result Value Ref Range    GLUCOSE, POC 188 (H) 70 - 105 mg/dl   BASIC METABOLIC PANEL   Result Value Ref Range    SODIUM 138 136 - 145 mmol/L    POTASSIUM 4.6 3.5 - 5.1 mmol/L    CHLORIDE 111 96 - 111 mmol/L    CO2 TOTAL 22 (L) 23 - 31 mmol/L    ANION GAP 5 4 - 13 mmol/L    CALCIUM 8.8 8.8 - 10.2 mg/dL    GLUCOSE 183 (H) 65 - 125 mg/dL    BUN 14 8 - 25 mg/dL    CREATININE 1.48 (H) 0.75 - 1.35 mg/dL    BUN/CREA RATIO 9 6 - 22    ESTIMATED GFR 51 (L) >=60 mL/min/BSA   MAGNESIUM   Result Value Ref Range    MAGNESIUM 1.6 (L) 1.8 - 2.6 mg/dL   PHOSPHORUS   Result Value Ref Range    PHOSPHORUS 3.4 2.3 - 4.0 mg/dL   CBC WITH DIFF   Result Value Ref Range    WBC 10.8 3.7 - 11.0 x10^3/uL    RBC 2.66 (L) 4.50 - 6.10 x10^6/uL    HGB 7.8 (L) 13.4 - 17.5 g/dL    HCT 24.8 (L) 38.9 - 52.0 %    MCV 93.2 78.0 - 100.0 fL  MCH 29.3 26.0 - 32.0 pg    MCHC 31.5 31.0 - 35.5 g/dL    RDW-CV 15.8 (H) 11.5 - 15.5 %    PLATELETS 287 150 - 400 x10^3/uL    MPV 10.0 8.7 - 12.5 fL    NEUTROPHIL % 71 %    LYMPHOCYTE % 15 %    MONOCYTE % 9 %    EOSINOPHIL % 1 %    BASOPHIL % 1 %    NEUTROPHIL # 7.72 (H) 1.50 - 7.70 x10^3/uL    LYMPHOCYTE # 1.59 1.00 - 4.80 x10^3/uL    MONOCYTE # 0.91 0.20 - 1.10 x10^3/uL    EOSINOPHIL # 0.13 <=0.50 x10^3/uL    BASOPHIL # <0.10 <=0.20 x10^3/uL    IMMATURE GRANULOCYTE % 3 (H) 0 - 1 %    IMMATURE GRANULOCYTE # 0.33 (H) <0.10 x10^3/uL     DNR Status:  Full Code    Assessment/Plan:   Active Hospital Problems    Diagnosis   . Primary Problem: Abscess of right shoulder     [name] [name] is a 59 y.o., white male with a history of anxiety, depression, diabetes with peripheral neuropathy, GERD, and diabetic foot wounds admitted as a direct admission from [facility] for 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  -orthopedics performed surgery  on 04/02/20.  Wound vac out.  -on Dapto, cefepime, and Flagyl with end date 05/14/20. OPAT complete. Has PICC.   -Oxycodone 15 mg Q4H PRN.   -1 culture was positive here but was contaminated.  No need to remove PICC line.    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  Advanced wound consulted.    -patient declined podiatry consult as he already follows with podiatrist outpatient.  -patient has known osteo on the left heel but wound appears to be well taken care of.  -patient was switched to daptomycin, cefepime, and Flagyl with a new end date of 05/14/20.   -stop drinking.    Thrombocytosis (RESOLVED)  -reactive and now resolving after surgery.  -stop drinking.    Anemia of chronic disease  -transfuse 2 units of blood on 04/01/20  -stop drinking.    Severe protein calorie malnutrition  -albumin very low likely from chronic inflammatory state and alcoholism.  -nutrition has been consulted.  -stop drinking.    CKD stage III  -continue to monitor.  Avoid nephrotoxic medication.  -stop drinking.    Hyperkalemia (RESOLVED)  -continue to monitor.    Anxiety / Depression  -continue  and Vistaril.  -stop drinking.    Diabetes with peripheral neuropathy  -continue Lantus 25 units nightly.  -started on lispro 3 units before meals.  -continue sliding scale insulin protocol.  -continue Neurontin but at reduced dosage due to CKD stage 3.   -stop drinking.      Alcohol use disorder, recurrent severe  -drinks 3-4 beers daily now but admits to drinking hard liquor recently in cutting down.  -he denies any history of delirium tremens or withdrawal seizures.  -2 alcohol-related arrests but does not consider his drinking a problem.  -continue thiamine.  -stop drinking.    Physical deconditioning  -PT OT recommending skilled nursing facility.   Ready for discharge  -stop drinking.  GERD  -continue famotidine.  -stop drinking.    DVT/PE Prophylaxis: Heparin    Disposition:  Wound vac out, ready for SNF placement. Awaiting insurance    Howell Rucks, MD 04/08/2020 08:18  Hospitalist  Department of Internal Medicine  [facility]

## 2020-04-09 LAB — BASIC METABOLIC PANEL
ANION GAP: 5 mmol/L (ref 4–13)
BUN/CREA RATIO: 11 (ref 6–22)
BUN: 13 mg/dL (ref 8–25)
CALCIUM: 8.8 mg/dL (ref 8.8–10.2)
CHLORIDE: 111 mmol/L (ref 96–111)
CO2 TOTAL: 23 mmol/L (ref 23–31)
CREATININE: 1.16 mg/dL (ref 0.75–1.35)
ESTIMATED GFR: 68 mL/min/BSA (ref >=60)
GLUCOSE: 139 mg/dL — ABNORMAL HIGH (ref 65–125)
POTASSIUM: 4.3 mmol/L (ref 3.5–5.1)
SODIUM: 139 mmol/L (ref 136–145)

## 2020-04-09 LAB — CBC WITH DIFF
BASOPHIL #: <0.1 10*3/uL (ref <=0.20)
BASOPHIL %: 1 %
EOSINOPHIL #: 0.19 10*3/uL (ref <=0.50)
EOSINOPHIL %: 2 %
HCT: 24.3 % — ABNORMAL LOW (ref 38.9–52.0)
HGB: 7.7 g/dL — ABNORMAL LOW (ref 13.4–17.5)
IMMATURE GRANULOCYTE #: 0.32 10*3/uL — ABNORMAL HIGH (ref <0.10)
IMMATURE GRANULOCYTE %: 3 % — ABNORMAL HIGH (ref 0–1)
LYMPHOCYTE #: 1.67 10*3/uL (ref 1.00–4.80)
LYMPHOCYTE %: 17 %
MCH: 29.4 pg (ref 26.0–32.0)
MCHC: 31.7 g/dL (ref 31.0–35.5)
MCV: 92.7 fL (ref 78.0–100.0)
MONOCYTE #: 0.81 10*3/uL (ref 0.20–1.10)
MONOCYTE %: 8 %
MPV: 10.1 fL (ref 8.7–12.5)
NEUTROPHIL #: 6.85 10*3/uL (ref 1.50–7.70)
NEUTROPHIL %: 69 %
PLATELETS: 286 10*3/uL (ref 150–400)
RBC: 2.62 10*6/uL — ABNORMAL LOW (ref 4.50–6.10)
RDW-CV: 15.8 % — ABNORMAL HIGH (ref 11.5–15.5)
WBC: 9.9 10*3/uL (ref 3.7–11.0)

## 2020-04-09 LAB — POC BLOOD GLUCOSE (RESULTS)
GLUCOSE, POC: 159 mg/dL — ABNORMAL HIGH (ref 70–105)
GLUCOSE, POC: 147 mg/dL — ABNORMAL HIGH (ref 70–105)
GLUCOSE, POC: 143 mg/dL — ABNORMAL HIGH (ref 70–105)
GLUCOSE, POC: 146 mg/dL — ABNORMAL HIGH (ref 70–105)

## 2020-04-09 LAB — PHOSPHORUS: PHOSPHORUS: 3.4 mg/dL (ref 2.3–4.0)

## 2020-04-09 LAB — MAGNESIUM: MAGNESIUM: 1.6 mg/dL — ABNORMAL LOW (ref 1.8–2.6)

## 2020-04-09 NOTE — Progress Notes (Signed)
[facility]  General Medicine  Progress note    Date of Service:  04/09/2020  [name] [name], 59 y.o. male  Date of Admission:  04/01/2020  Date of Birth:  [birth year]  PCP: [facility]  CC: pyomyositis, osteomyelitis  Subjective:      Patient states that he is able to flex his wrist and grip his fingers but concerned about eventually being able to move his shoulder up and raise his arm. He will need to go through physical therapy to gauge ho much recovery he will have. He verbalized understanding. No F/C/CP/SOB/N/V/abdominal pain, diarrhea, constipation or any urinary tract symptoms. Does have dull ache in his R arm, unchanged.    I discussed plan of care with patient  - Patient appropriate and ready for SNF  - Care management has already started working on placement. Awaiting insurance auth. Anticipate Monday or later for discharge. Patient aware    Objective:  I reviewed all vital signs.  Examination:    Temperature: 36.8 C (98.3 F) Heart Rate: 83 BP (Non-Invasive): (!) 154/81 (nurse notified)   Respiratory Rate: 18 SpO2: 93 %       Physical Exam:   General:Cooperative male in no acute distress. VS reviewed.  Appears comfortable today.  Lungs: Clear to auscultation bilaterally. No crackles, rales or wheezing   Cardiovascular: regular rate and rhythm, S1, S2 normal, no murmur, click, rub or gallop   Abdomen: Soft, non-tender, Bowel sounds normal, non-distended, No hepatosplenomegaly   Extremities: No cyanosis or edema; Drains from right shoulder, device on shoulder  Neurologic: Alert and oriented x3      Labs: I have reviewed all lab results.  Lab Results Today:    Results for orders placed or performed during the hospital encounter of 04/01/20 (from the past 24 hour(s))   POC BLOOD GLUCOSE (RESULTS)   Result Value Ref Range    GLUCOSE, POC 129 (H) 70 - 105 mg/dl   POC BLOOD GLUCOSE (RESULTS)   Result Value Ref Range    GLUCOSE, POC 106 (H) 70 - 105 mg/dl   POC BLOOD GLUCOSE (RESULTS)   Result  Value Ref Range    GLUCOSE, POC 141 (H) 70 - 105 mg/dl   BASIC METABOLIC PANEL   Result Value Ref Range    SODIUM 139 136 - 145 mmol/L    POTASSIUM 4.3 3.5 - 5.1 mmol/L    CHLORIDE 111 96 - 111 mmol/L    CO2 TOTAL 23 23 - 31 mmol/L    ANION GAP 5 4 - 13 mmol/L    CALCIUM 8.8 8.8 - 10.2 mg/dL    GLUCOSE 139 (H) 65 - 125 mg/dL    BUN 13 8 - 25 mg/dL    CREATININE 1.16 0.75 - 1.35 mg/dL    BUN/CREA RATIO 11 6 - 22    ESTIMATED GFR 68 >=60 mL/min/BSA   MAGNESIUM   Result Value Ref Range    MAGNESIUM 1.6 (L) 1.8 - 2.6 mg/dL   PHOSPHORUS   Result Value Ref Range    PHOSPHORUS 3.4 2.3 - 4.0 mg/dL   CBC WITH DIFF   Result Value Ref Range    WBC 9.9 3.7 - 11.0 x10^3/uL    RBC 2.62 (L) 4.50 - 6.10 x10^6/uL    HGB 7.7 (L) 13.4 - 17.5 g/dL    HCT 24.3 (L) 38.9 - 52.0 %    MCV 92.7 78.0 - 100.0 fL    MCH 29.4 26.0 - 32.0 pg  MCHC 31.7 31.0 - 35.5 g/dL    RDW-CV 15.8 (H) 11.5 - 15.5 %    PLATELETS 286 150 - 400 x10^3/uL    MPV 10.1 8.7 - 12.5 fL    NEUTROPHIL % 69 %    LYMPHOCYTE % 17 %    MONOCYTE % 8 %    EOSINOPHIL % 2 %    BASOPHIL % 1 %    NEUTROPHIL # 6.85 1.50 - 7.70 x10^3/uL    LYMPHOCYTE # 1.67 1.00 - 4.80 x10^3/uL    MONOCYTE # 0.81 0.20 - 1.10 x10^3/uL    EOSINOPHIL # 0.19 <=0.50 x10^3/uL    BASOPHIL # <0.10 <=0.20 x10^3/uL    IMMATURE GRANULOCYTE % 3 (H) 0 - 1 %    IMMATURE GRANULOCYTE # 0.32 (H) <0.10 x10^3/uL   POC BLOOD GLUCOSE (RESULTS)   Result Value Ref Range    GLUCOSE, POC 159 (H) 70 - 105 mg/dl     DNR Status:  Full Code    Assessment/Plan:   Active Hospital Problems    Diagnosis   . Primary Problem: Abscess of right shoulder     [name] [name] is a 59 y.o., white male with a history of anxiety, depression, diabetes with peripheral neuropathy, GERD, and diabetic foot wounds admitted as a direct admission from [facility] for 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    Streptococcus sepsis / right  shoulder osteomyelitis / right shoulder pyomyositis  -patient underwent multiple surgeries at [facility] and was transferred here for further debridement.  -orthopedics performed surgery  on 04/02/20.  Wound vac out.  -on Dapto, cefepime, and Flagyl with end date 05/14/20. OPAT complete. Has PICC.   -Oxycodone 15 mg Q4H PRN.   -1 culture was positive here but was contaminated.  No need to remove PICC line.    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  Advanced wound consulted.    -patient declined podiatry consult as he already follows with podiatrist outpatient.  -patient has known osteo on the left heel but wound appears to be well taken care of.  -patient was switched to daptomycin, cefepime, and Flagyl with a new end date of 05/14/20.   -stop drinking.    Thrombocytosis (RESOLVED)  -reactive and now resolving after surgery.  -stop drinking.    Anemia of chronic disease  -transfuse 2 units of blood on 04/01/20  -stop drinking.    Severe protein calorie malnutrition  -albumin very low likely from chronic inflammatory state and alcoholism.  -nutrition has been consulted.  -stop drinking.    CKD stage III  -continue to monitor.  Avoid nephrotoxic medication.  -stop drinking.    Hyperkalemia (RESOLVED)  -continue to monitor.    Anxiety / Depression  -continue  and Vistaril.  -stop drinking.    Diabetes with peripheral neuropathy  -continue Lantus 25 units nightly.  -started on lispro 3 units before meals.  -continue sliding scale insulin protocol.  -continue Neurontin but at reduced dosage due to CKD stage 3.   -stop drinking.      Alcohol use disorder, recurrent severe  -drinks 3-4 beers daily now but admits to drinking hard liquor recently in cutting down.  -he denies any history of delirium tremens or withdrawal seizures.  -2 alcohol-related arrests but does not consider his drinking a problem.  -continue thiamine.  -stop drinking.    Physical deconditioning  -PT OT  recommending  skilled nursing facility.  Ready for discharge  -stop drinking.    GERD  -continue famotidine.  -stop drinking.    DVT/PE Prophylaxis: Heparin    Disposition:  Wound vac out, ready for SNF placement. Awaiting insurance    Howell Rucks, MD 04/09/2020 07:22  Hospitalist  Department of Internal Medicine  [facility]

## 2020-04-10 LAB — BASIC METABOLIC PANEL
ANION GAP: 4 mmol/L (ref 4–13)
BUN/CREA RATIO: 10 (ref 6–22)
BUN: 14 mg/dL (ref 8–25)
CALCIUM: 8.6 mg/dL — ABNORMAL LOW (ref 8.8–10.2)
CHLORIDE: 112 mmol/L — ABNORMAL HIGH (ref 96–111)
CO2 TOTAL: 22 mmol/L — ABNORMAL LOW (ref 23–31)
CREATININE: 1.38 mg/dL — ABNORMAL HIGH (ref 0.75–1.35)
ESTIMATED GFR: 55 mL/min/BSA — ABNORMAL LOW (ref >=60)
GLUCOSE: 141 mg/dL — ABNORMAL HIGH (ref 65–125)
POTASSIUM: 4.4 mmol/L (ref 3.5–5.1)
SODIUM: 138 mmol/L (ref 136–145)

## 2020-04-10 LAB — CBC WITH DIFF
BASOPHIL #: <0.1 10*3/uL (ref <=0.20)
BASOPHIL %: 1 %
EOSINOPHIL #: 0.15 10*3/uL (ref <=0.50)
EOSINOPHIL %: 2 %
HCT: 24.6 % — ABNORMAL LOW (ref 38.9–52.0)
HGB: 7.7 g/dL — ABNORMAL LOW (ref 13.4–17.5)
IMMATURE GRANULOCYTE #: 0.2 10*3/uL — ABNORMAL HIGH (ref <0.10)
IMMATURE GRANULOCYTE %: 2 % — ABNORMAL HIGH (ref 0–1)
LYMPHOCYTE #: 1.82 10*3/uL (ref 1.00–4.80)
LYMPHOCYTE %: 19 %
MCH: 29.3 pg (ref 26.0–32.0)
MCHC: 31.3 g/dL (ref 31.0–35.5)
MCV: 93.5 fL (ref 78.0–100.0)
MONOCYTE #: 0.83 10*3/uL (ref 0.20–1.10)
MONOCYTE %: 8 %
MPV: 10.2 fL (ref 8.7–12.5)
NEUTROPHIL #: 6.75 10*3/uL (ref 1.50–7.70)
NEUTROPHIL %: 68 %
PLATELETS: 318 10*3/uL (ref 150–400)
RBC: 2.63 10*6/uL — ABNORMAL LOW (ref 4.50–6.10)
RDW-CV: 16 % — ABNORMAL HIGH (ref 11.5–15.5)
WBC: 9.8 10*3/uL (ref 3.7–11.0)

## 2020-04-10 LAB — CREATINE KINASE (CK), TOTAL, SERUM OR PLASMA: CREATINE KINASE: 28 U/L — ABNORMAL LOW (ref 45–225)

## 2020-04-10 LAB — POC BLOOD GLUCOSE (RESULTS)
GLUCOSE, POC: 132 mg/dL — ABNORMAL HIGH (ref 70–105)
GLUCOSE, POC: 187 mg/dL — ABNORMAL HIGH (ref 70–105)

## 2020-04-10 LAB — MAGNESIUM: MAGNESIUM: 1.5 mg/dL — ABNORMAL LOW (ref 1.8–2.6)

## 2020-04-10 LAB — COVID-19 ~~LOC~~ MOLECULAR LAB TESTING: SARS-CoV-2: NOT DETECTED

## 2020-04-10 LAB — PHOSPHORUS: PHOSPHORUS: 3.5 mg/dL (ref 2.3–4.0)

## 2020-04-10 MED ORDER — MAGNESIUM SULFATE 2 GRAM/50 ML (4 %) IN WATER INTRAVENOUS PIGGYBACK
2.0000 g | INJECTION | Freq: Once | INTRAVENOUS | Status: AC
Start: 2020-04-10 — End: 2020-04-10
  Administered 2020-04-10: 2 g via INTRAVENOUS
  Administered 2020-04-10: 0 g via INTRAVENOUS
  Filled 2020-04-10: qty 50

## 2020-04-10 NOTE — Nurses Notes (Signed)
Pt d/c'd at this time via EMS, packet provided, also called facility back to confirm 1400 dose of Cefepime wasn't given and to give upon arrival to facility. Also informed pt was given 15 mg oxycodone prior to leaving.

## 2020-04-10 NOTE — Discharge Instructions (Signed)
Discharge Recommendations/ Plan:Discharge to:SNF Placement (Medicare certified) (code 3)      Resources: [facility] [phone]

## 2020-04-10 NOTE — Progress Notes (Signed)
[facility]  General Medicine  Progress note    Date of Service:  04/10/2020  Karlene Lineman, 59 y.o. male  Date of Admission:  04/01/2020  Date of Birth:  Jul 06, 1961  PCP: [facility]  CC: pyomyositis, osteomyelitis  Subjective:      Patient has no complaints. No F/C/CP/SOB/N/V/abdominal pain diarrhea, constipation or any urinary tract symptoms. States he only had one of the COVID shots, was wondering if he could get the second. States he got it at the same facility he is going to.     I discussed plan of care with patient  - Patient appropriate and ready for SNF  - Care management has already started working on placement. Awaiting insurance auth. Anticipate Monday or later for discharge. Patient aware   - COVID swab today    Objective:  I reviewed all vital signs.  Examination:    Temperature: 37.3 C (99.2 F) Heart Rate: 83 BP (Non-Invasive): 131/70   Respiratory Rate: 18 SpO2: 97 %       Physical Exam:   General:Cooperative male in no acute distress. VS reviewed.  Appears comfortable today.  Lungs: Clear to auscultation bilaterally. No crackles, rales or wheezing   Cardiovascular: regular rate and rhythm, S1, S2 normal, no murmur, click, rub or gallop   Abdomen: Soft, non-tender, Bowel sounds normal, non-distended, No hepatosplenomegaly   Extremities: No cyanosis or edema; Drains from right shoulder, device on shoulder  Neurologic: Alert and oriented x3      Labs: I have reviewed all lab results.  Lab Results Today:    Results for orders placed or performed during the hospital encounter of 04/01/20 (from the past 24 hour(s))   POC BLOOD GLUCOSE (RESULTS)   Result Value Ref Range    GLUCOSE, POC 159 (H) 70 - 105 mg/dl   POC BLOOD GLUCOSE (RESULTS)   Result Value Ref Range    GLUCOSE, POC 147 (H) 70 - 105 mg/dl   POC BLOOD GLUCOSE (RESULTS)   Result Value Ref Range    GLUCOSE, POC 143 (H) 70 - 105 mg/dl   POC BLOOD GLUCOSE (RESULTS)   Result Value Ref Range    GLUCOSE, POC 146 (H) 70 - 105 mg/dl      DNR Status:  Full Code    Assessment/Plan:   Active Hospital Problems    Diagnosis   . Primary Problem: Abscess of right shoulder     Yeshua Stryker is a 59 y.o., white male with a history of anxiety, depression, diabetes with peripheral neuropathy, GERD, and diabetic foot wounds admitted as a direct admission from [facility] for 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    Streptococcus sepsis / right shoulder osteomyelitis / right shoulder pyomyositis  -patient underwent multiple surgeries at [facility] and was transferred here for further debridement.  -orthopedics performed surgery  on 04/02/20.  Wound vac out.  -on Dapto, cefepime, and Flagyl with end date 05/14/20. OPAT complete. Has PICC.   -Oxycodone 15 mg Q4H PRN.   -1 culture was positive here but was contaminated.  No need to remove PICC line.    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  Advanced wound consulted.    -patient declined podiatry consult as he already follows with podiatrist outpatient.  -patient has known osteo on the left heel but wound appears to be well taken care of.  -patient was switched to  daptomycin, cefepime, and Flagyl with a new end date of 05/14/20.   -stop drinking.    Thrombocytosis (RESOLVED)  -reactive and now resolving after surgery.  -stop drinking.    Anemia of chronic disease  -transfuse 2 units of blood on 04/01/20  -stop drinking.    Severe protein calorie malnutrition  -albumin very low likely from chronic inflammatory state and alcoholism.  -nutrition has been consulted.  -stop drinking.    CKD stage III  -continue to monitor.  Avoid nephrotoxic medication.  -stop drinking.    Hyperkalemia (RESOLVED)  -continue to monitor.    Anxiety / Depression  -continue  and Vistaril.  -stop drinking.    Diabetes with peripheral neuropathy  -continue Lantus 25 units  nightly.  -started on lispro 3 units before meals.  -continue sliding scale insulin protocol.  -continue Neurontin but at reduced dosage due to CKD stage 3.   -stop drinking.      Alcohol use disorder, recurrent severe  -drinks 3-4 beers daily now but admits to drinking hard liquor recently in cutting down.  -he denies any history of delirium tremens or withdrawal seizures.  -2 alcohol-related arrests but does not consider his drinking a problem.  -continue thiamine.  -stop drinking.    Physical deconditioning  -PT OT recommending skilled nursing facility.  Ready for discharge  -stop drinking.    GERD  -continue famotidine.  -stop drinking.    DVT/PE Prophylaxis: Heparin    Disposition:  Wound vac out, ready for SNF placement. Awaiting insurance    Howell Rucks, MD 04/10/2020 04:45  Hospitalist  Department of Internal Medicine  [facility]

## 2020-04-10 NOTE — Care Management Notes (Signed)
[facility] Management Note    Patient Name: [name] [name]  Date of Birth: 1961/03/20  Sex: male  Date/Time of Admission: 04/01/2020 12:23 PM  Room/Bed: 04/A  Payor: [place] MEDICARE / Plan: [place] MEDICARE ADVANTAGE PPO / Product Type: PPO /    LOS: 9 days   Primary Care Providers:  [facility], [place] (General)    Admitting Diagnosis:  Abscess of right shoulder [L02.413]    Assessment:      04/10/20 1224   Assessment Details   Assessment Type Continued Assessment   Date of Care Management Update 04/10/20   Date of Next DCP Update 04/13/20   Medicare Intent to Discharge Documentation   Discharge IMM give to: Patient   Discharge IMM Letter Given Date 04/10/20   Discharge IMM Letter Given Time 1045   IMM explained/reviewed with:  Patient;verbalized understanding   Care Management Plan   Discharge Planning Status plan in progress   Projected Discharge Date 04/10/20   Discharge plan discussed with: Patient   Facility or [facility]   Discharge Needs Assessment   Outpatient/Agency/Support Group Needs skilled nursing facility   Discharge Facility/Level of Care Needs SNF Placement (Medicare certified)(code 3)   Transportation Available ambulance   Patient's SNF has been authorized. Bed available today at [facility] pending negative COVID screen. Patient would like to speak with ortho surgeon prior to discharge. IMM signed and given to the patient. Tiffany, RN updated to plans. AVS updated. [place] PAS approved. Ambulance required for transport.    Discharge Plan:  SNF Placement (Medicare certified) (code 3)      The patient will continue to be evaluated for developing discharge needs.     Case Manager: Antoine Poche, [place]  Phone: [phone]

## 2020-04-10 NOTE — Nurses Notes (Signed)
Called facility with report, EMS scheduled for 4 PM, informed pts LUE Dbl PICC only has x1 Lumen that works. I was not told in report and found when I went to flush line, also not'd unable to administer 2PM dose of cefepime on time so service said to admin as soon as possibly can upon arrival to facility, also to medicate with oxy prior to d/c, pt left with all belongings, rx for Neurontin and oxy placed in packet, pt left with all belongings.

## 2020-04-10 NOTE — Care Management Notes (Signed)
04/10/20 1001   Patient Hand-Off   Clinical/Discharge Plan of Care Information Communicated to:  Clinical Care Coordinator  (Brie Reiter, [place])

## 2020-04-10 NOTE — Care Management Notes (Incomplete)
[facility] Management Note    Patient Name: [name] [name]  Date of Birth: 05/02/1961  Sex: male  Date/Time of Admission: 04/01/2020 12:23 PM  Room/Bed: 04/A  Payor: [place] MEDICARE / Plan:  MEDICARE ADVANTAGE PPO / Product Type: PPO /    LOS: 9 days   Primary Care Providers:  [facility], [place] (General)    Admitting Diagnosis:  Abscess of right shoulder [L02.413]    Assessment:      04/10/20 0848   Assessment Details   Assessment Type Continued Assessment   Date of Care Management Update 04/10/20   Date of Next DCP Update 04/13/20   Care Management Plan   Discharge Planning Status plan in progress   Projected Discharge Date 04/11/20   Discharge Needs Assessment   Discharge Facility/Level of Care Needs SNF Placement (Medicare certified)(code 3)       Discharge Plan:  SNF Placement (Medicare certified) (code 3)    Notified via secure chat by service, patient is discharge ready. Following CM notes, patient is waiting on auth for discharge to [facility].     The patient will continue to be evaluated for developing discharge needs.     Case Manager: Oswaldo Conroy  Phone: [phone]

## 2020-04-10 NOTE — Consults (Signed)
[facility] of Orthopaedics  Service: Oncology  Attending: Enis Slipper  Progress Note  04/10/2020    Name: [name] [name]  DOB: [birth year]  MRN: [id number]    RECENT ORTHO SURGERY:  - I&D R shoulder = 7/25 (Attending Surgeon - Mendel Ryder)    SUBJECTIVE:  59 y.o. male resting in bed. Shoulder pain has been improving over the weekend.    OBJECTIVE:  AF, VSS  BP 131/70   Pulse 83   Temp 37.3 C (99.2 F)   Resp 18   Ht 1.803 m (5' 10.98")   Wt 83.6 kg (184 lb 4.9 oz)   SpO2 97%   BMI 25.72 kg/m         GEN - NAD,  resting in bed  Incision c/d/i over R shoulder  MSK RUE:  Motor- weak thumbs up, okay sign, abducts fingers  Sensation- SILT r/u/m  Vasc- BCR fingertips      ASSESSMENT:  59 y.o. male 8 Days Post-Op s/p I&D R shoulder    PLAN:  -PICC line in place   - Weightbearing: NWB RUE. Maintain brace for now  - PT/OT: ordered. SNF. Please have patient come out of brace to work on elbow ROM  - DVT prophylaxis: heparin  - Antibiotics: Dapto, Cefepime, Flagyl  - Cx: No initial growth  - Pain: per primary  - Drain: none left in place  - Dressing: changed this AM   - Nursing instructions: mobilize  - Dispo: pending progress  - Follow-up: will see back in Dr. Kathrin Greathouse clinic 2 weeks    Verlon Au, MD  Resident, PGY-3  Department of Orthopaedics  Pager [phone]  04/10/2020 05:54

## 2020-04-11 NOTE — Care Management Notes (Deleted)
Patient d/c before IMM could be given. CCC not aware patient was d/c ready.

## 2020-04-11 NOTE — Care Management Notes (Signed)
Referral Information  ++++++ Placed Provider #1 ++++++  Case Manager: Antoine Poche  Provider Type: Nursing Home/SNF  Provider Name: [facility]  Address:  [address]  [place], [place] [address]  Contact:    Fax:   Fax:

## 2020-04-13 ENCOUNTER — Encounter (INDEPENDENT_AMBULATORY_CARE_PROVIDER_SITE_OTHER): Payer: Self-pay | Admitting: Infectious Disease

## 2020-04-13 NOTE — Progress Notes (Signed)
Spoke with snf nurse and confirmed correct antibiotics, dosing and lab orders and end date .   Nurse states pt has apt here on Monday with infectious disease doctor and dr Cornell Barman on 04/17/2020.  Melonie [place]  LPN  OPAT Nurse   [id number]

## 2020-04-13 NOTE — Progress Notes (Signed)
Outpatient Parenteral Antimicrobial Therapy (OPAT) Note    Indication: right shoulder abscess and associated osteomyelitis  Antibiotic Regimen:  Daptomycin 450mg  every 24 hours , cefepime 2g every 12 hours, metronidazole 500 mg po every 8 hours  Duration:6 weeks  End date: 05/14/2020    Antibiotic Discharge Reconciliation Note     Discharge Date from [place]: 04/11/2020  Discharged to:  SNF        [place]:  [place] [facility]    DME matches opat note  Melonie [place]  LPN  OPAT Nurse   [id number]

## 2020-04-17 ENCOUNTER — Encounter (HOSPITAL_BASED_OUTPATIENT_CLINIC_OR_DEPARTMENT_OTHER): Payer: Medicare PPO | Admitting: Orthopaedic Surgery

## 2020-04-17 ENCOUNTER — Ambulatory Visit (HOSPITAL_BASED_OUTPATIENT_CLINIC_OR_DEPARTMENT_OTHER): Payer: Self-pay | Admitting: Orthopaedic Surgery

## 2020-04-17 ENCOUNTER — Encounter (HOSPITAL_BASED_OUTPATIENT_CLINIC_OR_DEPARTMENT_OTHER): Payer: Medicare PPO | Admitting: Infectious Disease

## 2020-04-17 NOTE — Telephone Encounter (Addendum)
Called and left message with nurses desk to call me at [phone] with this information      Patient has arc brace.   Not sure there is anything else we can do if he refuses to wear it.   No other suggestions from Dr. Mendel Ryder.   LS 8/9@805       Regarding: Provider Consult  ----- Message from Thomos Lemons sent at 04/14/2020  1:30 PM EDT -----  Dr. Mendel Ryder pt    Patient's therapist at his skilled nursing facility. She states that Dr. Mendel Ryder discharged the patient with a splint for his right arm, but the patient is not being compliant with it. Patient is saying that it's just the opinion of Dr. Mendel Ryder that it will help, but that's not necessarily true, and that it's uncomfortable, itc. She states that the occupational therapy assistants cannot get the patient to wear it. She is hoping to discuss this with Dr. Mendel Ryder, and also discuss possible options of what else to do, or what Dr. Mendel Ryder would recommend? Please give her a call. Thanks!

## 2020-04-20 ENCOUNTER — Encounter (INDEPENDENT_AMBULATORY_CARE_PROVIDER_SITE_OTHER): Payer: Self-pay | Admitting: Infectious Disease

## 2020-04-20 NOTE — Progress Notes (Signed)
Spoke to [facility] [phone] regarding labs, nurse stated that no labs have been drawn.  They do have the order so I asked if they could be drawn, nurse supposed to draw Friday 04/21/20.    [place]

## 2020-04-24 ENCOUNTER — Encounter (HOSPITAL_BASED_OUTPATIENT_CLINIC_OR_DEPARTMENT_OTHER): Payer: Self-pay | Admitting: Infectious Disease

## 2020-04-24 ENCOUNTER — Encounter (HOSPITAL_BASED_OUTPATIENT_CLINIC_OR_DEPARTMENT_OTHER): Payer: Self-pay | Admitting: Orthopaedic Surgery

## 2020-04-24 ENCOUNTER — Other Ambulatory Visit: Payer: Self-pay

## 2020-04-24 ENCOUNTER — Ambulatory Visit (HOSPITAL_BASED_OUTPATIENT_CLINIC_OR_DEPARTMENT_OTHER): Payer: Medicare PPO | Admitting: Orthopaedic Surgery

## 2020-04-24 ENCOUNTER — Ambulatory Visit: Payer: Medicare PPO | Attending: Infectious Disease | Admitting: Infectious Disease

## 2020-04-24 ENCOUNTER — Ambulatory Visit (HOSPITAL_BASED_OUTPATIENT_CLINIC_OR_DEPARTMENT_OTHER): Payer: Medicare PPO

## 2020-04-24 VITALS — BP 119/49 | HR 67 | Temp 97.0°F

## 2020-04-24 DIAGNOSIS — L02413 Cutaneous abscess of right upper limb: Secondary | ICD-10-CM

## 2020-04-24 DIAGNOSIS — Z9889 Other specified postprocedural states: Secondary | ICD-10-CM

## 2020-04-24 DIAGNOSIS — B372 Candidiasis of skin and nail: Secondary | ICD-10-CM | POA: Insufficient documentation

## 2020-04-24 DIAGNOSIS — M86011 Acute hematogenous osteomyelitis, right shoulder: Secondary | ICD-10-CM | POA: Insufficient documentation

## 2020-04-24 LAB — CBC WITH DIFF
BASOPHIL #: <0.1 10*3/uL (ref <=0.20)
BASOPHIL %: 1 %
EOSINOPHIL #: 0.3 10*3/uL (ref <=0.50)
EOSINOPHIL %: 4 %
HCT: 32.6 % — ABNORMAL LOW (ref 38.9–52.0)
HGB: 10.1 g/dL — ABNORMAL LOW (ref 13.4–17.5)
IMMATURE GRANULOCYTE #: <0.1 10*3/uL (ref <0.10)
IMMATURE GRANULOCYTE %: 0 % (ref 0–1)
LYMPHOCYTE #: 1.29 10*3/uL (ref 1.00–4.80)
LYMPHOCYTE %: 17 %
MCH: 29.4 pg (ref 26.0–32.0)
MCHC: 31 g/dL (ref 31.0–35.5)
MCV: 95 fL (ref 78.0–100.0)
MONOCYTE #: 0.58 10*3/uL (ref 0.20–1.10)
MONOCYTE %: 8 %
MPV: 10.7 fL (ref 8.7–12.5)
NEUTROPHIL #: 5.15 10*3/uL (ref 1.50–7.70)
NEUTROPHIL %: 70 %
PLATELETS: 220 10*3/uL (ref 150–400)
RBC: 3.43 10*6/uL — ABNORMAL LOW (ref 4.50–6.10)
RDW-CV: 15.3 % (ref 11.5–15.5)
WBC: 7.4 10*3/uL (ref 3.7–11.0)

## 2020-04-24 LAB — HEPATIC FUNCTION PANEL
ALBUMIN: 2.5 g/dL — ABNORMAL LOW (ref 3.4–4.8)
ALKALINE PHOSPHATASE: 61 U/L (ref 45–115)
ALT (SGPT): 7 U/L — ABNORMAL LOW (ref 10–55)
AST (SGOT): 14 U/L (ref 8–45)
BILIRUBIN DIRECT: 0.3 mg/dL (ref 0.1–0.4)
BILIRUBIN TOTAL: 0.4 mg/dL (ref 0.3–1.3)
PROTEIN TOTAL: 7.1 g/dL (ref 6.0–8.0)

## 2020-04-24 LAB — BUN: BUN: 16 mg/dL (ref 8–25)

## 2020-04-24 LAB — C-REACTIVE PROTEIN(CRP),INFLAMMATION: CRP INFLAMMATION: 17.1 mg/L — ABNORMAL HIGH (ref <8.0)

## 2020-04-24 LAB — CREATININE WITH EGFR
CREATININE: 1.09 mg/dL (ref 0.75–1.35)
ESTIMATED GFR: 73 mL/min/BSA (ref >=60)

## 2020-04-24 LAB — CREATINE KINASE (CK), TOTAL, SERUM OR PLASMA: CREATINE KINASE: 24 U/L — ABNORMAL LOW (ref 45–225)

## 2020-04-24 MED ORDER — NYSTATIN 100,000 UNIT/GRAM TOPICAL POWDER
Freq: Two times a day (BID) | CUTANEOUS | 2 refills | Status: AC
Start: 2020-04-24 — End: 2020-05-08

## 2020-04-24 NOTE — Nursing Note (Signed)
Upon arrival into the room, RN noticed PICC Line was ~ 20 cm out of the patient's arm.  MD notified.    PICC line pulled.  Occlusive dressing placed over the site and patient advised to leave on for 24 hours.  Labs not drawn.

## 2020-04-24 NOTE — Progress Notes (Signed)
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    BACKGROUND:   Mr. [name] [name] is a 59 year old male with a history of diabetes, acid reflux, MRSA left eye cellulitis in November 2020, and chronic osteomyelitis of the left foot who follows with the Orthopaedic Infectious Disease clinic due to a right shoulder osteomyelitis. He was admitted to [facility] from June 30th through April 01, 2020 with right shoulder pain after a fall. Admission blood cultures grew Streptococcus agalactiae. A wound culture from his left foot also grew Streptococcus agalactiae. He was treated with vancomycin, cefepime and metronidazole for his left foot osteomyelitis. Eventually, imaging of the right shoulder was performed which showed a right shoulder septic arthritis. He was taken to the OR on July 7 and March 23, 2020 for incision and drainage of the right shoulder. OR cultures were no growth. A repeat MRI was performed which showed worsening infection. This prompted his transfer to [facility] where he was hospitalized from July 24 through April 10, 2020. An MRI of the right scapula performed at [facility] showed osteomyelitis of the proximal humerus with extensive right shoulder pyomyositis. On April 02, 2020, he was taken to the OR for incision and drainage of the right shoulder and periscapular muscles as well as incision and drainage of the proximal humerus. OR cultures are no growth. The Infectious Disease consult service recommended a 6 week course of daptomycin, cefepime, and metronidazole with a planned end date of May 14, 2020.       SUBJECTIVE:   [name] is a 59 year old male who presents to Orthopaedic Infectious Disease clinic today for hospital followup.  He was seen during a recent hospital admission  from April 01, 2020, through April 10, 2020.  He is currently residing at [facility] in [place], [place].  He is receiving IV antibiotics through a PICC line in his left upper extremity.  The patient believes he pulled on it while in the ambulance on his way to clinic today. Unfortunately, we have not received any lab work from the rehab facility despite OPAT contacting them a few days ago.  He denies any recent fevers or chills.  He does report some itching in his groin and has Candida intertrigo on exam.  His left heel ulcer is purulent.  He reports that they are simply performing dressing changes and he is not receiving any wound care at the rehab facility.  His right shoulder incision is well healing.  Staples are in place.  There is no erythema or drainage.  He does report occasional diarrhea on antibiotics but is receiving stool softeners.  His PICC line was noted to be almost completely pulled out in clinic.     While the patient was in clinic, I called his rehab facility and spoke with Otila Kluver, the patient's nurse.  I asked Otila Kluver to make arrangements for him to have a new PICC line placed locally.  I also told Otila Kluver that we need to get the patient to see a wound care physician or podiatrist ASAP.    MEDICATIONS:  Current Outpatient Medications   Medication Sig   . Cefepime 2 gram Intravenous Recon Soln 1 g by Intravenous route Every 12 hours   . DAPTOMYCIN IV by Intravenous route   .  escitalopram oxalate (LEXAPRO) 10 mg Oral Tablet Take 10 mg by mouth Once a day   . famotidine (PEPCID) 40 mg Oral Tablet Take 40 mg by mouth Every evening   . gabapentin (NEURONTIN) 300 mg Oral Capsule Take 2 Capsules (600 mg total) by mouth Three times a day   . insulin glargine 100 unit/mL Subcutaneous injection (vial) 25 Units by Subcutaneous route Every night   . insulin lispro (HUMALOG) 100 unit/mL Subcutaneous Solution 5 Units by Subcutaneous route Three times daily before meals   . metroNIDAZOLE  (FLAGYL) 500 mg Oral Tablet Take 1 Tablet (500 mg total) by mouth Three times a day for 37 days   . nystatin (NYSTOP) 100,000 unit/gram Powder by Apply Topically route Twice daily for 14 days   . oxyCODONE (ROXICODONE) 5 mg Oral Tablet Take 3 Tablets (15 mg total) by mouth Every 4 hours as needed   . polyethylene glycol (MIRALAX) 17 gram Oral Powder in Packet Take 1 Packet (17 g total) by mouth Once a day   . Saccharomyces boulardii (FLORASTOR) 250 mg Oral Capsule Take 250 mg by mouth Twice daily   . sennosides-docusate sodium (SENOKOT-S) 8.6-50 mg Oral Tablet Take 2 Tablets by mouth Twice daily       OBJECTIVE:  Vital signs:  BP (!) 119/49   Pulse 67   Temp 36.1 C (97 F)    General:  The patient is chronically ill appearing, lying on a stretcher.   Eyes:  There is injection of his left eye, which he keeps rubbing during the exam.   ENT:  He is edentulous.  There are no mucosal ulcers or oral candidiasis.   Cardiovascular:  Heart is regular rate and rhythm with no murmurs.   Chest:  Lungs are clear to auscultation bilaterally with no wheezing or crackles.   Abdomen:  Soft, nontender, and nondistended with normal bowel sounds.   Skin:  There is Candida intertrigo, but no generalized rashes.  A PICC line in his left extremity has no surrounding erythema or drainage, but it is almost completely pulled out.  Musculoskeletal:  The incision over his right shoulder is well approximated with staples in place.  There is no erythema or drainage.  He has a large left heel ulcer with purulent drainage.   Neurologic:  He is alert and oriented x3.             LABS:  Last lab work was from April 10, 2020, which showed a white blood cell count of 9800, hemoglobin of 7.7, platelets of 318,000, and creatinine of 1.38.    ASSESSMENT:  This is a 59 year old male with a history of diabetes, acid reflux, methicillin-resistant Staphylococcus aureus (MRSA), left eye cellulitis in November 2020, and chronic osteomyelitis of the left  foot, who developed a group B strep bacteremia related to his left foot ulcer, which likely seeded his right shoulder.  He underwent debridements of the right shoulder at [facility] on March 15, 2020, and March 23, 2020, and then was transferred to [facility] due to worsening infection.  On April 02, 2020, he was taken to the OR for incision and drainage of the right shoulder and periscapular muscles as well as incision and drainage of the proximal humerus.  OR cultures were no growth, but the patient had been on IV antibiotics for several weeks prior to the surgery.  He is currently on a 6-week course of daptomycin, cefepime, and metronidazole with an end date of May 14, 2020.  We have several concerns in clinic today.    PLAN:  1. We pulled his PICC line in clinic today as it was almost completely out.  I spoke with his rehab facility and they will make arrangements for him to have a new PICC placed.  2. I also advised the rehab facility that he needs to see a wound care physician or podiatrist ASAP as his heel ulcer is worse than in the hospital.  3. We will continue daptomycin, cefepime, and metronidazole for 6 weeks with an end date of May 14, 2020.  4. The rehab facility drew labs this morning and they will fax them to OPAT as soon as the labs return.  5. He was given a prescription for Nystatin powder for Candida intertrigo.  6. Plan was discussed with Dr. Mendel Ryder.    We will see the patient back in 4 weeks in coordination with Dr. Mendel Ryder.            Elenore Paddy, MD  Assistant Professor   Section of Infectious Diseases   Department of Internal Medicine   [facility]              DD:  04/24/2020 09:34:53  DT:  04/24/2020 10:23:33 KLN  D#:  [id number]

## 2020-04-24 NOTE — Progress Notes (Unsigned)
PATIENT NAME: [name] [name], [name] [name]   HOSPITAL NUMBER:  [id number]  DATE OF SERVICE: 04/24/2020  DATE OF BIRTH:  [birth year]    PROGRESS NOTE    DATE OF SURGERY:  April 02, 2020.    PROCEDURE:  Irrigation and debridement of right shoulder chronic infection and periscapular abscesses.    SUBJECTIVE:  Mr. [name] [name] is a 59 year old gentleman who is approximately 3 weeks out from the above procedure.  He is currently at a nursing facility.  He is getting daptomycin, cefepime, and Flagyl through his PICC line and he is tolerating those medications well.  His OR cultures were positive for Staph epi and coag-negative staph.  He has been followed by infectious disease, who is seeing him today as well.  He has been keeping his right arm in a sling and not really been working on range of motion.  He has a left heel wound as well, which has been going on for several years, and he reports that he has received dressing changes, but no debridement at that site.  He has not had any fevers, chills, chest pain, shortness of breath, nausea, or vomiting.    OBJECTIVE:  On physical exam, his right shoulder range of motion is very poor.  He is quite stiff there with really any motion at the shoulder joint.  His elbow is a little bit stiff as well, but with some force I can extend him out almost fully.  Flexion is not an issue.  Full range of motion of his wrist and fingers.  He has normal sensation throughout his hand.  He is able make a thumbs up, A-OK, cross and abduct his fingers.  His surgical incision is healing well.  No signs of infection including erythema, fluctuance, or drainage.  His staples are in place and he does not have skin reaction to those.  Down at the left heel, he has a chronic wound over his left heel which looks quite terrible today, there is significant purulent drainage and poor looking tissue. No erythema, swelling, or crepitus proximally.    ASSESSMENT:  A 59 year old gentleman 3 weeks out from the above  procedure.    PLAN:  Mr. [name] [name] is going to continue on his antibiotics.  His PICC line was found to be essentially dislodged, and after speaking with the rehab facility, he will get that placed locally.  We placed a wet-to-dry dressing on his left heel, but he should see a podiatrist or wound care for a formal debridement of that wound, and this information was relayed to his facility as well.  We removed the staples from the surgical incision and he tolerated that well.  We will see him back in 4 weeks' time.        Loreen Freud, MD      Enis Slipper, MD  Assistant Professor   [facility] Department of Orthopaedics          I saw and examined the patient.  I reviewed the resident's note.  I agree with the findings and plan of care as documented in the resident's note.  Any exceptions/additions are edited/noted.    Enis Slipper, MD    DD:  04/24/2020 10:20:28  DT:  04/24/2020 12:16:34 LB  D#:  [id number]

## 2020-04-24 NOTE — Patient Instructions (Signed)
Patient's PICC line pulled in clinic as it was almost completely out.  Please have new PICC line placed as Dr. Bobby Rumpf discussed with Otila Kluver, nurse at [facility].  They will also fax his labs from Friday and today to OPAT.  He needs to see a wound care physician versus podiatrist ASAP.  Start Nystatin powder for Candida intertrigo.

## 2020-04-26 ENCOUNTER — Other Ambulatory Visit (HOSPITAL_BASED_OUTPATIENT_CLINIC_OR_DEPARTMENT_OTHER): Payer: Self-pay | Admitting: Physician Assistant

## 2020-04-26 ENCOUNTER — Ambulatory Visit (HOSPITAL_BASED_OUTPATIENT_CLINIC_OR_DEPARTMENT_OTHER): Payer: Self-pay | Admitting: Orthopaedic Surgery

## 2020-04-26 DIAGNOSIS — L02413 Cutaneous abscess of right upper limb: Secondary | ICD-10-CM

## 2020-04-26 NOTE — Telephone Encounter (Addendum)
Order and last o/n faxed to [phone] with a successful transmission      ----- Message from Luna Glasgow, [place] sent at 04/26/2020  9:25 AM EDT -----  Regarding: PT prescription  ----- Message from Salvatore Decent sent at 04/26/2020  8:16 AM EDT -----  Mendel Ryder pt     Alexis from [facility] called. needs new weight bearing status and any other new orders since last appt on 8/16.   Fax# [phone] attn Therapy    Thanks

## 2020-04-27 ENCOUNTER — Encounter (INDEPENDENT_AMBULATORY_CARE_PROVIDER_SITE_OTHER): Payer: Self-pay | Admitting: Infectious Disease

## 2020-04-27 NOTE — Progress Notes (Signed)
OPAT Lab Monitoring Note    I have reviewed the patient's OPAT labs.   They are stable when compared to previous values.  We will continue with the current course of treatment.       Mickle Asper, MD  Section of Infectious Diseases  04/27/2020

## 2020-04-30 LAB — FUNGUS CULTURE
FUNGAL CULTURE: NO GROWTH
FUNGAL CULTURE: NO GROWTH
FUNGAL CULTURE: NO GROWTH
FUNGAL CULTURE: NO GROWTH
FUNGAL CULTURE: NO GROWTH
FUNGAL CULTURE: NO GROWTH

## 2020-05-01 LAB — ENTER/EDIT OPAT LABS
ALKALINE PHOSPHATASE: 47
ALT (SGPT): 9
AST (SGOT): 16
BASOPHILS: 1
BILIRUBIN, TOTAL: 0.3
BUN: 19
C-REACTIVE PROTEIN (CRP),INFLAMMATION: 10 mg/L — AB (ref 0–8)
CREATININE: 1.2
EOSINOPHIL: 2.7
HCT: 31.6
HGB: 9.5
LYMPHOCYTES: 12.8
MONOCYTES: 6.6
PLATELET COUNT: 226
PMN ABS: 5.4
PMN'S: 76.9
WBC: 7

## 2020-05-04 ENCOUNTER — Encounter (INDEPENDENT_AMBULATORY_CARE_PROVIDER_SITE_OTHER): Payer: Self-pay | Admitting: Infectious Disease

## 2020-05-04 NOTE — Progress Notes (Signed)
[facility] [phone] regarding labs, no answer.  Will try to call back again.    [place]

## 2020-05-10 LAB — ENTER/EDIT OPAT LABS
ALKALINE PHOSPHATASE: 42
ALT (SGPT): 10
AST (SGOT): 15
BASOPHILS: 1.1
BILIRUBIN, TOTAL: 0.3
BUN: 24
C-REACTIVE PROTEIN (CRP),INFLAMMATION: <5 mg/L (ref 0–8)
CREATININE: 1.2
EOSINOPHIL: 2.1
HCT: 30
HGB: 9.8
LYMPHOCYTES: 16.5
MONOCYTES: 7.1
PLATELET COUNT: 193
PMN ABS: 5.5
PMN'S: 73.2
WBC: 7.5

## 2020-05-11 ENCOUNTER — Encounter (INDEPENDENT_AMBULATORY_CARE_PROVIDER_SITE_OTHER): Payer: Self-pay | Admitting: Infectious Disease

## 2020-05-11 NOTE — Progress Notes (Signed)
Spoke to nurse at [facility] [place] [phone] regarding labs, nurse to fax results from last week and this week to OPAT.    [place]

## 2020-05-11 NOTE — Progress Notes (Signed)
OPAT Lab Monitoring Note    I have reviewed the patient's OPAT labs.   They are stable when compared to previous values.  We will continue with the current course of treatment.       Brodan Grewell, MD  Section of Infectious Diseases  05/11/2020

## 2020-05-17 LAB — AFB CULTURE WITH STAIN
AFB CULTURE: NO GROWTH
AFB CULTURE: NO GROWTH
AFB CULTURE: NO GROWTH
AFB CULTURE: NO GROWTH
AFB CULTURE: NO GROWTH
AFB CULTURE: NO GROWTH
AFB SMEAR: NEGATIVE
AFB SMEAR: NEGATIVE
AFB SMEAR: NEGATIVE
AFB SMEAR: NEGATIVE
AFB SMEAR: NEGATIVE
AFB SMEAR: NEGATIVE

## 2020-05-22 ENCOUNTER — Telehealth (HOSPITAL_BASED_OUTPATIENT_CLINIC_OR_DEPARTMENT_OTHER): Payer: Self-pay | Admitting: Orthopaedic Surgery

## 2020-05-22 ENCOUNTER — Ambulatory Visit: Payer: Medicare PPO | Attending: Orthopaedic Surgery | Admitting: Orthopaedic Surgery

## 2020-05-22 ENCOUNTER — Encounter (HOSPITAL_BASED_OUTPATIENT_CLINIC_OR_DEPARTMENT_OTHER): Payer: Self-pay | Admitting: Orthopaedic Surgery

## 2020-05-22 ENCOUNTER — Encounter (HOSPITAL_BASED_OUTPATIENT_CLINIC_OR_DEPARTMENT_OTHER): Payer: Self-pay | Admitting: Infectious Disease

## 2020-05-22 ENCOUNTER — Ambulatory Visit (HOSPITAL_BASED_OUTPATIENT_CLINIC_OR_DEPARTMENT_OTHER): Payer: Medicare PPO | Admitting: Infectious Disease

## 2020-05-22 ENCOUNTER — Other Ambulatory Visit: Payer: Self-pay

## 2020-05-22 VITALS — BP 182/96 | HR 61 | Temp 97.0°F

## 2020-05-22 DIAGNOSIS — M869 Osteomyelitis, unspecified: Secondary | ICD-10-CM | POA: Insufficient documentation

## 2020-05-22 DIAGNOSIS — L02413 Cutaneous abscess of right upper limb: Secondary | ICD-10-CM | POA: Insufficient documentation

## 2020-05-22 NOTE — Progress Notes (Signed)
[place], [place] [address]  PATIENT NAME: [place] NUMBER: [id number]  DATE OF SERVICE: 05/22/2020  DATE OF BIRTH: 26-Jul-1961    PROGRESS NOTE    DATE OF SURGERY: April 02, 2020 Irrigation and debridement of right shoulder chronic infection and periscapular abscesses.    SUBJECTIVE:   [name] [name] is a 59 y.o. male established patient presenting to clinic today for a follow up regarding the above mentioned procedure. Micahel Omlor was last evaluated in clinic on 04/24/2020. At this visit, he was getting Daptomycin, Cefepime, and Flagyl through his PICC line and was tolerating those medications well. His OR cultures were positive for staph epi and coag-negative staph. He had been keeping his R arm in a sling and not really been working on ROM. He had a L heel wound as well which had been going on for several years and stated that he had received dressing changes but no debridement of the area. Presently, he returns to clinic in a stretcher and states that his R shoulder is a lot better. He does note that it is stiff and that he has limited ROM in it. He says that he continues to go to PT to work on this. He also states that at PT he does walk around with a walker. He is off of all antibiotics. He does note that he has pain, especially at night or after a hard session of PT. The patient that he saw a specialist for his heel wound and that it is doing a bit better. The patient has no other questions or concerns at this time.     OBJECTIVE:   30 degrees of forward flexion and abduction of R shoulder. Can get further passively. No pain. Incision completely healed. No outward signs of infection.    IMAGING:   No images were taken in clinic today.     ASSESSMENT AND PLAN:  S/p April 02, 2020 Irrigation and debridement of right shoulder chronic infection and periscapular abscesses.  1. Told patient to continue with PT.   2. The patient will follow up with [place] prn.  The patient is 6 weeks out from  prior surgery.    The patient's prior charts and images were reviewed.  We will follow up with the patient prn. All questions were answered. The patient appeared satisfied with the plan of treatment. If the patient has any questions or concerns, the patient is free to contact us.    I am scribing for, and in the presence of, Dr. Mendel Ryder for services provided on 05/22/2020.  Erin E Duffus, SCRIBE   Erin E Duffus, SCRIBE  05/22/2020, 12:31    I personally performed the services described in this documentation, as scribed  in my presence, and it is both accurate  and complete.    Enis Slipper, MD    Cornell Barman, MD  Assistant Professor  [facility] Department of Orthopaedics

## 2020-05-22 NOTE — Nursing Note (Signed)
Message from Salvatore Decent sent at 05/18/2020 11:11 AM EDT    LINDSEY PT     Ubaldo Glassing, OT [facility], called. States pt feels he will be able to come to his appt here Monday and have his shoulder manipulated then.   She'd like a call back to discuss.     Thanks    Call History     Type Contact Phone User   05/18/2020 11:09 AM EDT Phone (Incoming) Ubaldo Glassing, OT [facility] [phone] Lannette Donath - no that sounds fine, we typically don't perform manipulations and would have them work on shoulder ROM but if he is concerned I can see him and get xrays. SS 05/19/20 4:25 pm   Stacy - I called back and they didn't think it was dislocated but the patient thought he might need it secondary his ROM. They are thinking adhesive capillosus but his ROM has improved and he has compensated a lot. He is wanting pain medication more often than others but he insisted they call us to see if he needs a manipulation. I told them that he would need to be seen prior to any decision on this being made.  Did you want something different? Thanks, Delsa Sale 9/9 @ 1538    Called and left a message stating if they thought he was dislocated he should go to the ER to have this evaluated and be relocated. I asked her to return my call. SS 05/18/20 1:57 pm  Faythe Casa, RN  05/22/2020, 08:53

## 2020-05-22 NOTE — Progress Notes (Signed)
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    BACKGROUND:   Mr. [name] [name] is a 59 year old male with a history of diabetes, acid reflux, MRSA left eye cellulitis in November 2020, and chronic osteomyelitis of the left foot who follows with the Orthopaedic Infectious Disease clinic due to a right shoulder osteomyelitis. He was admitted to [facility] from June 30th through April 01, 2020 with right shoulder pain after a fall. Admission blood cultures grew Streptococcus agalactiae. A wound culture from his left foot also grew Streptococcus agalactiae. He was treated with vancomycin, cefepime and metronidazole for his left foot osteomyelitis. Eventually, imaging of the right shoulder was performed which showed a right shoulder septic arthritis. He was taken to the OR on July 7 and March 23, 2020 for incision and drainage of the right shoulder. OR cultures were no growth. A repeat MRI was performed which showed worsening infection. This prompted his transfer to [facility] where he was hospitalized from July 24 through April 10, 2020. An MRI of the right scapula performed at [facility] showed osteomyelitis of the proximal humerus with extensive right shoulder pyomyositis. On April 02, 2020, he was taken to the OR for incision and drainage of the right shoulder and periscapular muscles as well as incision and drainage of the proximal humerus. OR cultures are no growth. The Infectious Disease consult service recommended a 6 week course of daptomycin, cefepime, and metronidazole with a planned end date of May 14, 2020.     SUBJECTIVE:   Mr. [name] [name] is a 59 year old male who presents to Orthopaedic Infectious Disease clinic today for regular followup.  He was last seen on April 24, 2020.  He reports  that he completed his course of daptomycin, cefepime, and metronidazole about 3 days ago.  He continues to reside at [facility] in [place], [place].  He still has a PICC line in his left arm but says that his rehab facility is planning on pulling it either today or tomorrow.  He tolerated his course of antibiotics well with no rashes or diarrhea.  He denies any recent fevers or chills.  He is following closely with wound care for care of his left heel.  He continues to have some decreased range of motion and pain in his right shoulder, but overall this has improved.  He is working with physical therapy.    MEDICATIONS:  Current Outpatient Medications   Medication Sig   . acetaminophen (TYLENOL) 325 mg Oral Tablet Take 650 mg by mouth Every 6 hours as needed for Pain   . escitalopram oxalate (LEXAPRO) 10 mg Oral Tablet Take 10 mg by mouth Once a day   . famotidine (PEPCID) 40 mg Oral Tablet Take 40 mg by mouth Every evening   . gabapentin (NEURONTIN) 300 mg Oral Capsule Take 2 Capsules (600 mg total) by mouth Three times a day   . insulin glargine 100 unit/mL Subcutaneous injection (vial) 25 Units by Subcutaneous route Every night   . insulin lispro (HUMALOG) 100 unit/mL Subcutaneous Solution 5 Units by Subcutaneous route Three times daily before meals   . oxyCODONE (ROXICODONE) 5 mg Oral Tablet Take 3 Tablets (15 mg total) by mouth Every 4 hours as needed   . polyethylene glycol (MIRALAX) 17 gram Oral  Powder in Packet Take 1 Packet (17 g total) by mouth Once a day   . Saccharomyces boulardii (FLORASTOR) 250 mg Oral Capsule Take 250 mg by mouth Twice daily (Patient not taking: Reported on 05/22/2020 )   . sennosides-docusate sodium (SENOKOT-S) 8.6-50 mg Oral Tablet Take 2 Tablets by mouth Twice daily       OBJECTIVE:  Vital Signs:  BP (!) 182/96   Pulse 61   Temp 36.1 C (97 F)    General:  The patient is chronically ill appearing, lying on a stretcher.   Eyes:  There is no  conjunctival erythema or scleral icterus.   ENT:  He is edentulous.  There are no mucosal ulcers.   Cardiovascular:  Heart is regular rate and rhythm with no murmurs.   Chest:  Lungs are clear to auscultation bilaterally with no wheezing or crackles.   Abdomen:  Soft, nontender, and nondistended with normal bowel sounds.   Skin:  There are no rashes.  There is a PICC line in the left upper extremity.  Musculoskeletal:  The incision over his right shoulder is well approximated with no erythema or drainage.  His left heel ulcer is covered with a dressing, which was removed and the underlying granulation tissue is healthy-appearing.   Neurologic:  He is alert and oriented x3.    LABORATORY DATA:  Most recent lab work on May 10, 2020, showed a white blood cell count of 7500, hemoglobin of 9.8, platelets of 193,000, creatinine of 1.2, and CRP less than 5.    ASSESSMENT:  This is a 59 year old male with a history of diabetes, acid reflux, methicillin-resistant Staphylococcus aureus (MRSA), left eye cellulitis in November, 2020 and chronic osteomyelitis of the left foot, who developed a group B strep bacteremia related to his left foot ulcer with seeding of the right shoulder.  He underwent several debridements at [facility] and then was transferred to [facility] due to worsening infection.  On April 02, 2020, he underwent incision and drainage of the right shoulder and periscapular muscles as well as incision and drainage of the proximal humerus.  He completed a 6-week course of daptomycin. Cefepime, and metronidazole on May 14, 2020.    PLAN:  1. I gave the patient an order to have his PICC line pulled as long as the rehab facility is not using it for any other reason.  As long as the PICC line remains in place, he is at risk for infection.  2. He will continue to follow closely with wound care for his left heel ulcer.  3. The plan was discussed with Dr. Mendel Ryder in clinic.    He may follow up in Ortho  ID clinic as needed.        Elenore Paddy, MD  Assistant Professor   Section of Infectious Diseases   Department of Internal Medicine   [facility]              DD:  05/22/2020 12:26:06  DT:  05/22/2020 12:59:07 KLN  D#:  [id number]

## 2020-06-09 ENCOUNTER — Telehealth (INDEPENDENT_AMBULATORY_CARE_PROVIDER_SITE_OTHER): Payer: Self-pay | Admitting: Orthopaedic Surgery

## 2020-06-09 NOTE — Nursing Note (Signed)
appt needed  Received: Jerilee Field, Johna Sheriff [facility] Dr Mendel Ryder [facility]        Message from Thomos Lemons sent at 06/08/2020 3:30 PM EDT    Summary: appt needed    Dr. Mendel Ryder pt     Jarrett Soho called from [facility], and states that the patient is having right shoulder pain and loss of ROM, and wants to be evaluated to see if he is going to need another surgery. First available was 09/04/20, and patient had 04/02/20 Irrigation and debridement of right shoulder chronic infection and periscapular abscesses. Please advise on scheduling. Thanks!             Call History     Type Contact Phone User   06/08/2020 03:28 PM EDT Phone (Incoming) Jarrett Soho [phone] [id number] [place] [place] [facility]     Reviewed with Marzetta Board. Okay to offer 11/8, message sent for scheduling.  Faythe Casa, RN  06/09/2020, 14:41

## 2020-07-17 ENCOUNTER — Encounter (HOSPITAL_BASED_OUTPATIENT_CLINIC_OR_DEPARTMENT_OTHER): Payer: Self-pay | Admitting: Orthopaedic Surgery

## 2020-08-07 ENCOUNTER — Encounter (HOSPITAL_BASED_OUTPATIENT_CLINIC_OR_DEPARTMENT_OTHER): Payer: Self-pay | Admitting: Orthopaedic Surgery

## 2020-08-07 ENCOUNTER — Ambulatory Visit: Payer: Medicare PPO | Attending: Orthopaedic Surgery | Admitting: Orthopaedic Surgery

## 2020-08-07 ENCOUNTER — Other Ambulatory Visit: Payer: Self-pay

## 2020-08-07 VITALS — BP 154/89 | HR 68 | Temp 97.2°F

## 2020-08-07 DIAGNOSIS — L02413 Cutaneous abscess of right upper limb: Secondary | ICD-10-CM | POA: Insufficient documentation

## 2020-08-07 NOTE — Progress Notes (Signed)
[place], [place] [address]  PATIENT NAME: [name] [name] NUMBER: [id number]  DATE OF SERVICE: 08/07/2020  DATE OF BIRTH: 01-02-1961    PROGRESS NOTE    DATE OF SURGERY:  April 02, 2020 Irrigation and debridement of right shoulder chronic infection and periscapular abscesses.    SUBJECTIVE:   [name] [name] is a 59 y.o. male established patient presenting to clinic today for a follow up. Tahmir [name] was last evaluated in clinic on 05/22/2020. At this visit, he was in a stretcher and stated that his R shoulder was a lot better. He did note that it was stiff and that he had limited ROM in it. He said that he continued to go to PT to work on this. He also stated that at PT he does walk around with a walker. He was off of all antibiotics at this point. He did note that he had pain, especially at night or after a hard session of PT. The patient said that he saw a specialist for his heel wound and that it was doing a bit better. Presently, he returns to clinic with lack of shoulder function and mobility. He arrived in a stretcher. He claims his shoulder "feels stuck" and that the pain keeps him up at night. He mainly has trouble with forward flexion and reaching over his head. He says he continues to go to PT which helps. He does still require pain medication. In terms of his foot wound, he says it is improving as he has been going to a wound specialist. Otherwise, the patient has no other questions or concerns at this time.    OBJECTIVE:   3 strength in subscapularis. 3+ strength in external rotators. 5 strength in deltoid. Deltoid fires. No signs of active infection. Wound looks okay.    IMAGING:   No images were taken in clinic today.     ASSESSMENT AND PLAN:  S/p  April 02, 2020 Irrigation and debridement of right shoulder chronic infection and periscapular abscesses.  1. 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  2. Surgery not recommended due to high  risk of infection and potential loss of arm.  3. Recommended continuation of PT to improve symptoms.  4. Follow up prn.  The patient is 4 months out from prior surgery.    The patient's prior charts and images were reviewed.  We will follow up with the patient prn. All questions were answered. The patient appeared satisfied with the plan of treatment. If the patient has any questions or concerns, the patient is free to contact us.    I am scribing for, and in the presence of, Dr. Mendel Ryder for services provided on 08/07/2020.  Caryl Bis, SCRIBE   Caryl Bis, [place]  08/07/2020, 11:54    I personally performed the services described in this documentation, as scribed  in my presence, and it is both accurate  and complete.    Enis Slipper, MD    Cornell Barman, MD  Assistant Professor  [facility] Department of Orthopaedics

## 2020-08-29 ENCOUNTER — Ambulatory Visit: Payer: Medicare PPO | Attending: Specialist | Admitting: Specialist

## 2020-08-29 ENCOUNTER — Encounter (INDEPENDENT_AMBULATORY_CARE_PROVIDER_SITE_OTHER): Payer: Self-pay | Admitting: Specialist

## 2020-08-29 ENCOUNTER — Other Ambulatory Visit: Payer: Self-pay

## 2020-08-29 VITALS — BP 130/90 | HR 78 | Temp 98.2°F | Resp 18 | Ht 70.0 in

## 2020-08-29 DIAGNOSIS — L02413 Cutaneous abscess of right upper limb: Secondary | ICD-10-CM | POA: Insufficient documentation

## 2020-08-29 DIAGNOSIS — M791 Myalgia, unspecified site: Secondary | ICD-10-CM | POA: Insufficient documentation

## 2020-08-29 DIAGNOSIS — Z87891 Personal history of nicotine dependence: Secondary | ICD-10-CM

## 2020-08-29 NOTE — Nursing Note (Signed)
New Patient Visit    [name] [name]    No chief complaint on file.        [place] Pain Rating Scale     On a scale of 0-10, during the past 24 hours, pain has interfered with you usual activity: 8     On a scale of 0-10, during the past 24 hours, pain has interfered with your sleep: 8    On a scale of 0-10, during the past 24 hours, pain has affected your mood: 8     On a scale of 0-10, during the past 24 hours, pain has contributed to your stress: 8     On a scale of 0-10, what is your overall pain Rating: 8        Vitals:    08/29/20 1330   BP: (!) 130/90   Pulse: 78   Resp: 18   Temp: 36.8 C (98.2 F)   SpO2: 100%   Height: 1.778 m (5\' 10" )   PainSc:   8   PainLoc: Shoulder       Body mass index is 26.44 kg/m.    If taking opioid medication, current prescribing provider: ERIC HUANG    Recent imaging:  1. No results found for this or any previous visit (from the past 035009381 hour(s)).   2. No results found for this or any previous visit (from the past 829937169 hour(s)).   3. No results found for this or any previous visit (from the past 678938101 hour(s)).   4. No results found for this or any previous visit (from the past 751025852 hour(s)).  5. MRI 7.25.21 SCAPULA RIGHT  6. XRAY 7.25.21 SHOULDER RIGHT      Prior Pain Management:  PT: yes  Heat: no   Ice: no   Chiropractor: no   Bedrest: no   Injection: no   VAS (0-10) Now: 8  VAS (0-10) Best: 8  VAS (0-10) Worst: 9    Appliances Needed:       Other Questions:  Recurring Fevers: no  Numbness/tingling: yes  Trouble sleeping: yes  Poor/increased appetite: no  Weight loss/Gain: no  Muscle Spasm: no  Cold/burning sensation: no  Skin discolors/where: no  Bowel/bladder issues: no  Urinary urgency-frequency: no    Jacquenette Shone, Ambulatory Care Assistant  08/29/2020, 13:38

## 2020-08-29 NOTE — H&P (Signed)
[facility]                              Pain Management, [facility]  [address]  [place] [address]  [phone]         History and Physical      Date of Service: 08/29/2020  Name: [name] [name]  Date of Birth: 05/16/1961      Primary Service: Nursing home  Consult Requested By: none  Reason for Consult: I want my medication    Obtained History From: Pt. and records provided and in EPIC    CHIEF COMPLAINT: I want my percs    HPI: [name] [name] is a 59 y.o. y.o., White male who presents with DM/ETOH/S/P infections and surgeries, 03/2020, Dr. Fransisco Beau stated that he is off all ABX. And recommended ;  S/p April 02, 2020 Irrigation and debridement of right shoulder chronic infection and periscapular abscesses.  1. 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  2. Surgery not recommended due to high risk of infection and potential loss of arm.    3.Recommended continuation of PT to improve symptoms.      the patient is with moderate to severe pain and functional distress due to pain.    Words: Aching and stuck  When pain started:few months  Intensity (current): 8  Intensity (past 24 hours averaged) 8  Location: RightShoulder  Duration: Constant  Aggravated by: Positioning /Alleviated by: Medications  Associated Symptoms: none  Patient Pain Control Goal:  0  Level of Function: moderate  For the past three months, patient reports difficulties with these ADLS due to the pain reported above:  Working,shopping,dressing, showering, putting shoes on, bending forwards or backwards, twisting , repetitive motion, turning in bed.      Conservative Treatments tried and failed in the past  for the patient's moderate to severe pain.    Please refer to the new patient form I have reviewed .    Procedures:            HOME MEDICATION REGIMEN: acetaminophen (TYLENOL) 325 mg Oral Tablet, Take 650 mg by mouth Every 6 hours as needed for  Pain  escitalopram oxalate (LEXAPRO) 10 mg Oral Tablet, Take 10 mg by mouth Once a day  famotidine (PEPCID) 40 mg Oral Tablet, Take 40 mg by mouth Every evening  gabapentin (NEURONTIN) 300 mg Oral Capsule, Take 2 Capsules (600 mg total) by mouth Three times a day  insulin glargine 100 unit/mL Subcutaneous injection (vial), 25 Units by Subcutaneous route Every night  insulin lispro (HUMALOG) 100 unit/mL Subcutaneous Solution, 5 Units by Subcutaneous route Three times daily before meals  oxyCODONE (ROXICODONE) 5 mg Oral Tablet, Take 3 Tablets (15 mg total) by mouth Every 4 hours as needed  polyethylene glycol (MIRALAX) 17 gram Oral Powder in Packet, Take 1 Packet (17 g total) by mouth Once a day (Patient not taking: Reported on 08/07/2020 )  Saccharomyces boulardii (FLORASTOR) 250 mg Oral Capsule, Take 250 mg by mouth Twice daily    sennosides-docusate sodium (SENOKOT-S) 8.6-50 mg Oral Tablet, Take 2 Tablets by mouth Twice daily    No facility-administered medications prior to visit.      CURRENT REGIMEN:   Outpatient Medications Marked as Taking for the 08/29/20 encounter (Office Visit) with Marygrace Drought., MD   Medication Sig    acetaminophen (TYLENOL) 325 mg Oral Tablet  Take 650 mg by mouth Every 6 hours as needed for Pain    escitalopram oxalate (LEXAPRO) 10 mg Oral Tablet Take 10 mg by mouth Once a day    famotidine (PEPCID) 40 mg Oral Tablet Take 40 mg by mouth Every evening    gabapentin (NEURONTIN) 300 mg Oral Capsule Take 2 Capsules (600 mg total) by mouth Three times a day    insulin glargine 100 unit/mL Subcutaneous injection (vial) 25 Units by Subcutaneous route Every night    insulin lispro (HUMALOG) 100 unit/mL Subcutaneous Solution 5 Units by Subcutaneous route Three times daily before meals    oxyCODONE (ROXICODONE) 5 mg Oral Tablet Take 3 Tablets (15 mg total) by mouth Every 4 hours as needed    Saccharomyces boulardii (FLORASTOR) 250 mg Oral Capsule Take 250 mg by mouth Twice daily       sennosides-docusate sodium (SENOKOT-S) 8.6-50 mg Oral Tablet Take 2 Tablets by mouth Twice daily       ROS: (MUST comment on all "Abnormal" findings)  Reviewed/ Summarized records and/or obtained History from patient and history reviewed via medical record  Other than ROS in the HPI, all other systems were negative.    PAST MEDICAL/ FAMILY/ SOCIAL HISTORY:   Reviewed/ Summarized records and/or obtained History from patient and history reviewed via medical record  Past Medical History:   Diagnosis Date    Anxiety     Depression     Diabetes (CMS [place])     Diabetes mellitus, type 2 (CMS HCC)     GERD (gastroesophageal reflux disease)     MRSA (methicillin resistant staph aureus) culture positive 08/06/2019    L eye cellulitis    Osteomyelitis (CMS HCC)     Peripheral neuropathy           Cannot display prior to admission medications because the patient has not been admitted in this contact.        No current facility-administered medications for this visit.    No Known Allergies  Past Surgical History:   Procedure Laterality Date    MANDIBLE SURGERY      SHOULDER SURGERY           Social History     Tobacco Use    Smoking status: Former Smoker     Types: Cigars    Smokeless tobacco: Never Used   Brewing technologist Use: Former   Substance Use Topics    Alcohol use: Yes     Comment: Drinks 3-4 beers daily.    Drug use: Never     Family Medical History:     Problem Relation (Age of Onset)    Diabetes Mother, Father                  I have reviewed and updated as appropriate the past medical, family and social history.                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  HENT:ENMT without erythema or injection, mucous membranes moist.  Neck: no thyromegaly or lymphadenopathy  Musculoskeletal:   Gait and Station:Abnormal: in a stretcher  Sensation: Normal  Musculoskeletal Tenderness: negative  Lungs: normal percussion bilaterally  Cardiovascular: regular rate and rhythm  Abdomen: Soft, non-tender  Extremities: No cyanosis or edema  Skin: No rashes  Neurologic: Alert and oriented x3  Lymphatics: No lymphadenopathy  Psychiatric: Normal affect, behavior, memory, thought content, judgement, and speech.    Labs Ordered/ Reviewed : (and discussed with patient)  Reviewed: Labs:  I have reviewed all lab results.    Radiology Tests Ordered/ Reviewed: (and discussed with patient)  Reviewed: MRI showed Osteomyelitis of the proximal humerus with extensive right shoulder pyomyositis.      ASSESSMENT:      ICD-10-CM    1. Abscess of right shoulder  L02.413    2. Myalgia  M79.10          PLAN/RECOMMENDATIONS:     No injections as high risk for infections  Continue PT  May start Cymbalta /TENS  Pt. Is off ABX. And may wean down opioids  Control DM  I offered referral to high risk for B. Med and Suboxone but he refused          Our impression/plan was discussed with the patient.  The patient had a opportunity to discuss tests / findings and their plan of care ; and all questions were answered. Pt educated on red flags  and when to go to the ED ( red flags ) .    F/u as needed, Return to clinic or to closest emergency room with any new or worsening in conditions    The patient education was given that discussed improving their general level of fitness, weight control and core strengthening to help their chronic pain.    The procedure using a spine model was explained, including the associated risks such as but not limited to pain at the  injection site, infection, bleeding, reaction to medication and failure of the pain to improve. Our impression, recommendation, and plan from today's visit was reviewed, explained and discussed in detail with the patient in the office. All of the patients questions were answered. Patient verbalized and in agreement with our plan.      I spent 25 minutes more than half the time in explanations, organizing care and planning.    A copy of the consult and recommendation will be sent to PCP and/or referring physician if not on EPIC.    The date and time stamp below reflect the action of opening and starting the note and do not reflect the visit start time, end time or visit length.    Adriahna Shearman N. Cordarryl Monrreal, MD 08/29/2020, 13:56

## 2020-09-04 ENCOUNTER — Encounter (HOSPITAL_BASED_OUTPATIENT_CLINIC_OR_DEPARTMENT_OTHER): Payer: Self-pay | Admitting: Orthopaedic Surgery

## 2021-10-12 IMAGING — MR MRI ANKLE LT WO CONTAST
4 of 6 series · 23 of 40 positions shown · IV contrast (gadolinium)
Comparison: None available.

﻿EXAM:  [DATE]   MRI ANKLE LT WO CONTAST,MRI FOOT LT WO CONTRAST
INDICATION: Heel infection.
TECHNIQUE: Multiplanar multisequential MRI of the left ankle and foot was performed without gadolinium contrast.

[Series 8: T1 · sagittal · left · 3.4mm · 0.33mm/px · 6 of 24 slices shown (1 of 3)]
[im 1/24]
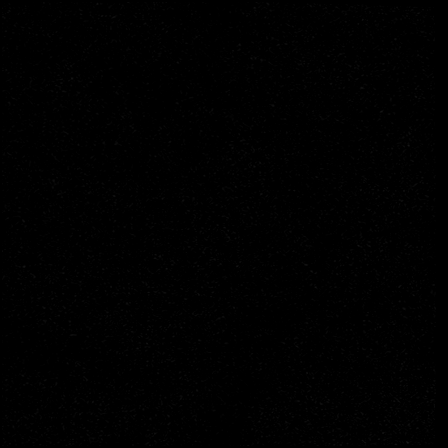
[im 5/24]
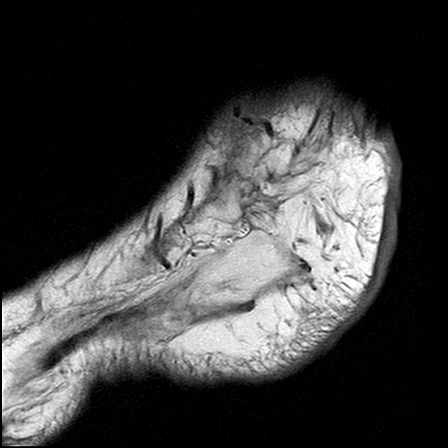
[im 10/24]
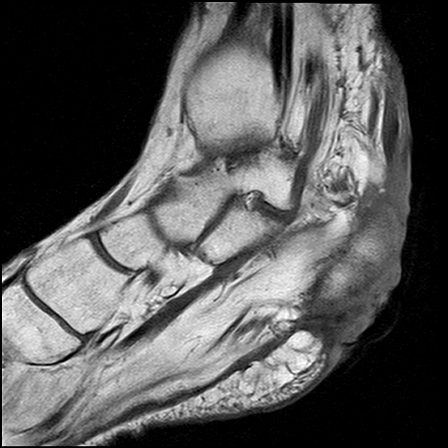
[im 14/24]
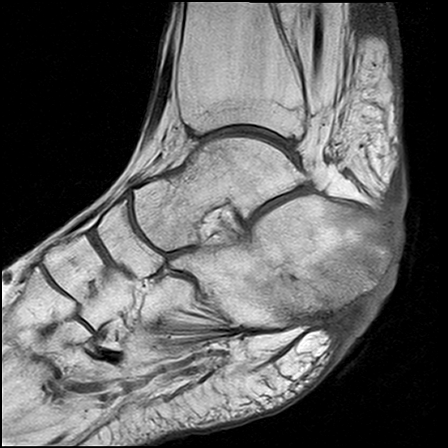
[im 19/24]
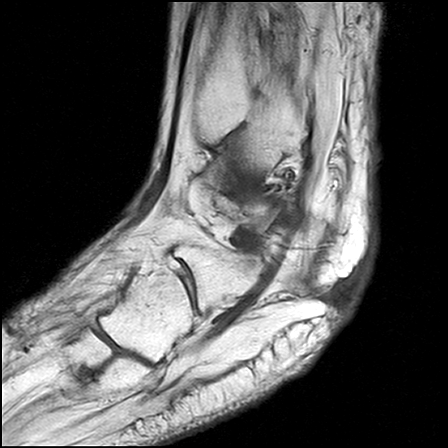
[im 24/24]
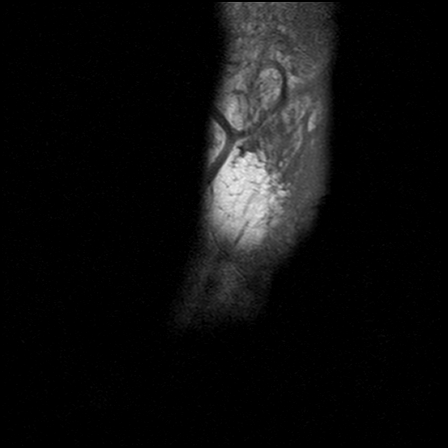

[Series 10: T1 · axial · left · 4.0mm · 0.29mm/px · z∈[-50,+72]mm · 7 of 28 slices shown (2 of 3)]
[im 1/28]
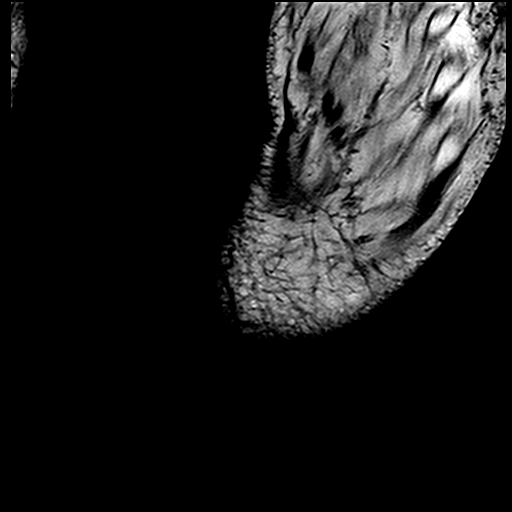
[im 5/28]
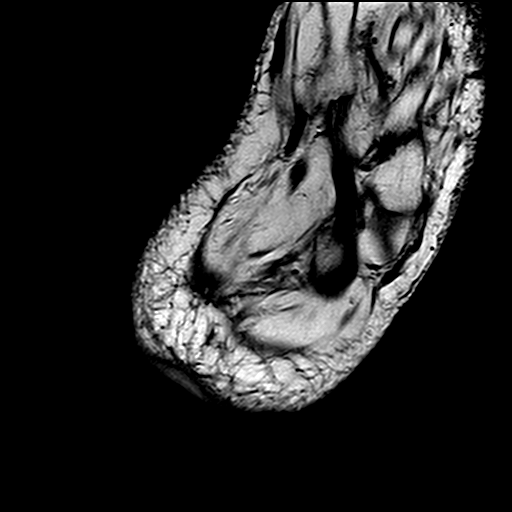
[im 10/28]
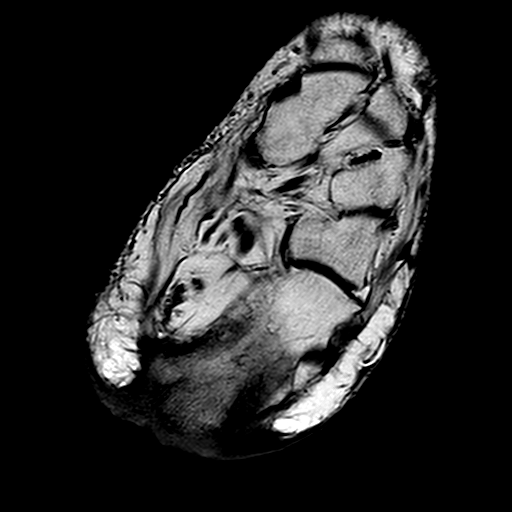
[im 14/28]
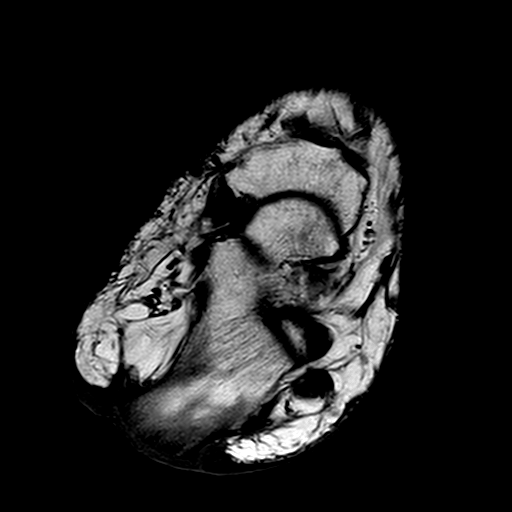
[im 19/28]
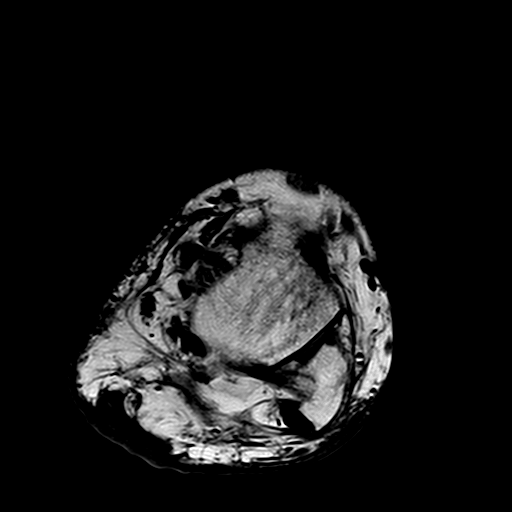
[im 23/28]
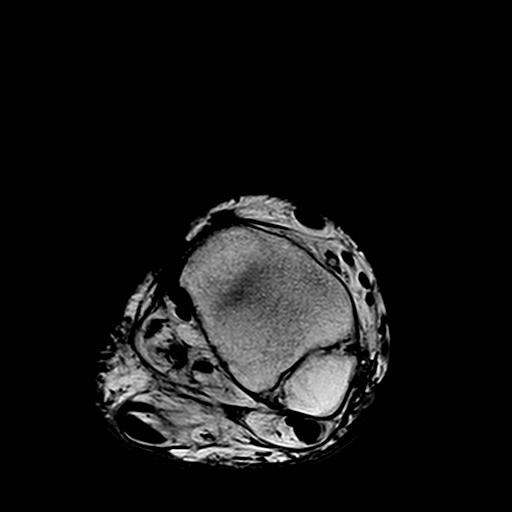
[im 28/28]
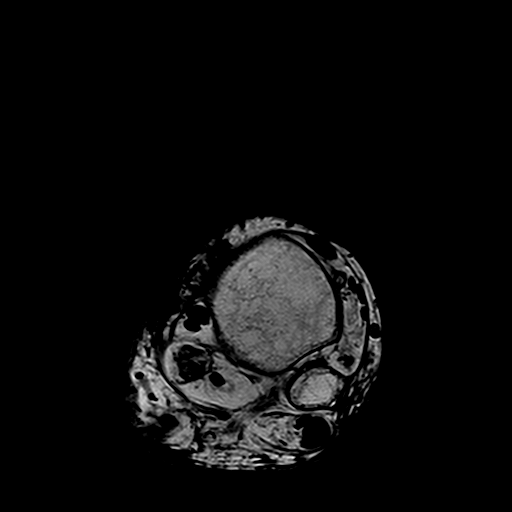

[Series 11: T2 fat-sat · axial · left · 4.0mm · 0.33mm/px · z∈[-50,+72]mm · 7 of 28 slices shown]
[im 1/28]
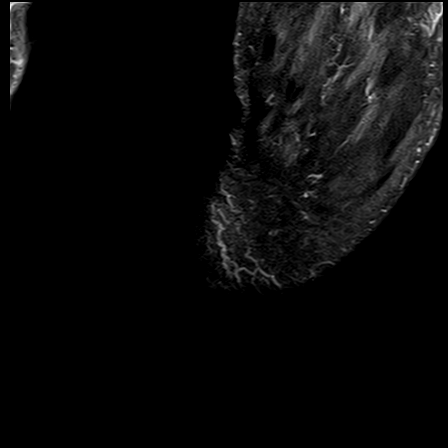
[im 5/28]
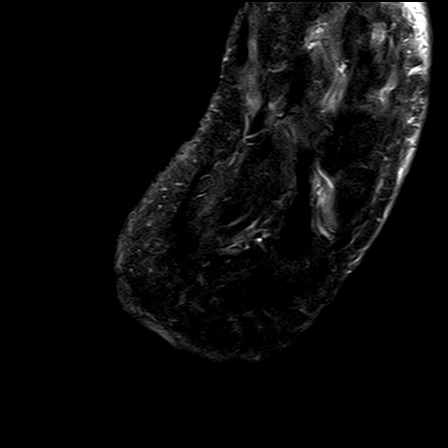
[im 10/28]
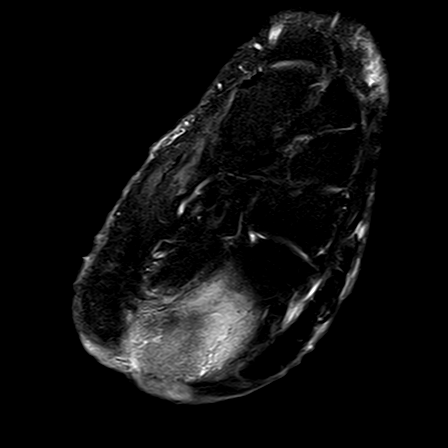
[im 14/28]
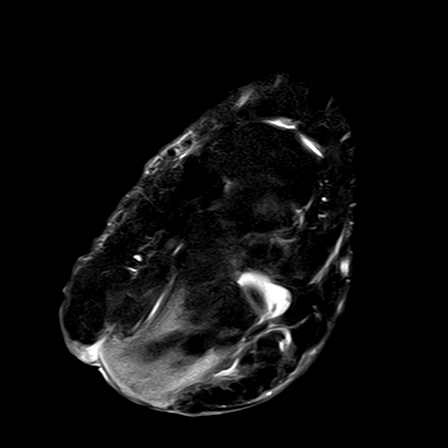
[im 19/28]
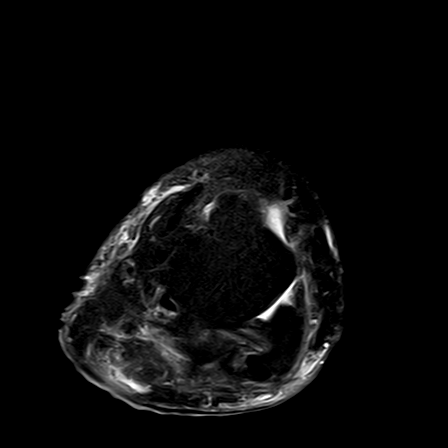
[im 23/28]
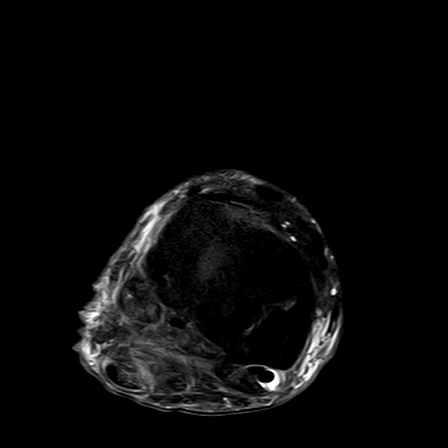
[im 28/28]
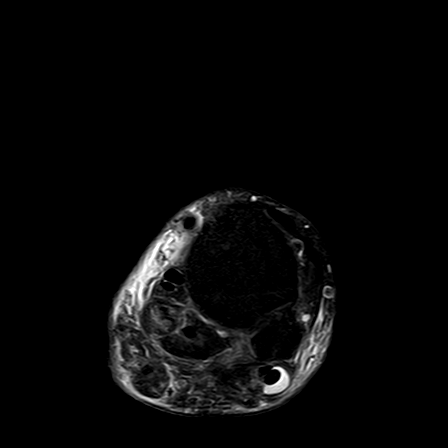

[Series 12: T1 · coronal · left · 4.0mm · 0.29mm/px · 3 of 26 slices shown (3 of 3)]
[im 5/26]
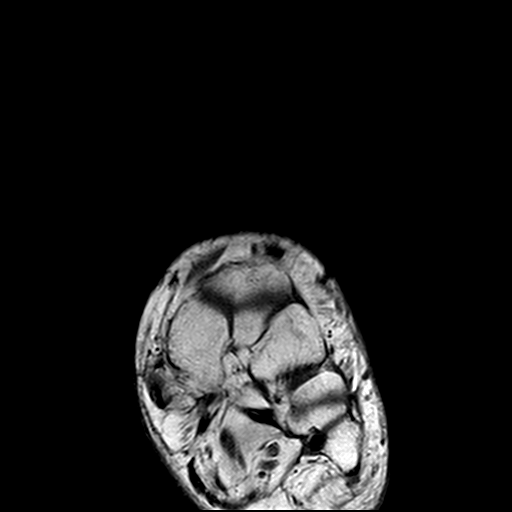
[im 13/26]
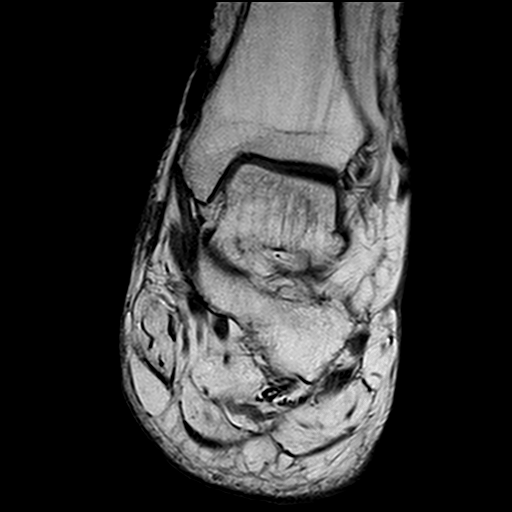
[im 21/26]
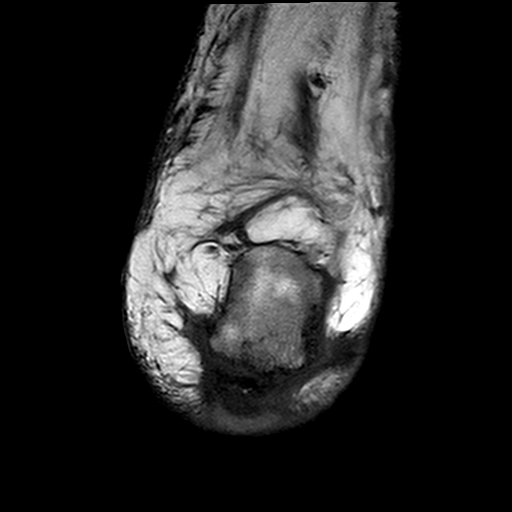

[23 of 40 positions shown; findings below may reference images not displayed]

FINDINGS: There is a large heel wound.  Confluent abnormal low T1 signal intensity is also seen within the posterior calcaneus consistent with osteomyelitis.  Achilles tendon is also completely torn and retracted.  There is mild peroneal tenosynovitis. Flexor and extensor tendons appear unremarkable.  Lisfranc ligament is normal. Normal T1 signal intensity is seen within the sinus tarsi. No significant arthritic changes are noted within the ankle and foot. There is marked fatty atrophy involving the intrinsic muscles of the forefoot.  The plantar aponeurosis is normal without fasciitis, fibromatosis or tear.
IMPRESSION: 1. Large heel wound with osteomyelitis of the posterior calcaneus. 

2. Marked fatty atrophy involving the intrinsic muscles of the forefoot.  

3. Full-thickness Achilles tendon tear.  

4. Mild peroneal tenosynovitis.

## 2021-10-12 IMAGING — MR MRI FOOT LT WO CONTRAST
4 of 6 series · 19 of 40 positions shown · IV contrast (gadolinium)
Comparison: None available.

﻿EXAM:  [DATE]   MRI ANKLE LT WO CONTAST,MRI FOOT LT WO CONTRAST
INDICATION: Heel infection.
TECHNIQUE: Multiplanar multisequential MRI of the left ankle and foot was performed without gadolinium contrast.

[Series 5: T1 · sagittal · left · 3.2mm · 0.49mm/px · 6 of 22 slices shown (1 of 3)]
[im 1/22]
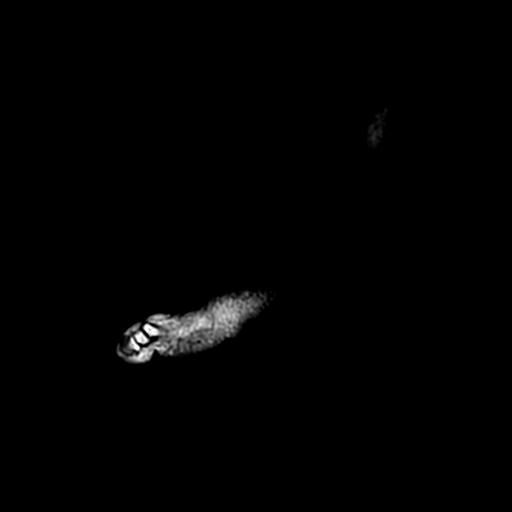
[im 5/22]
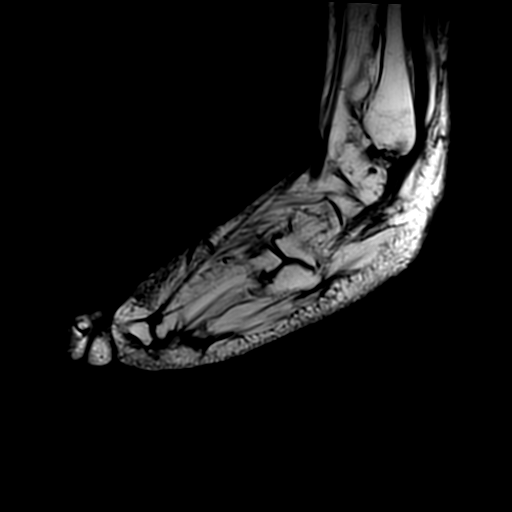
[im 9/22]
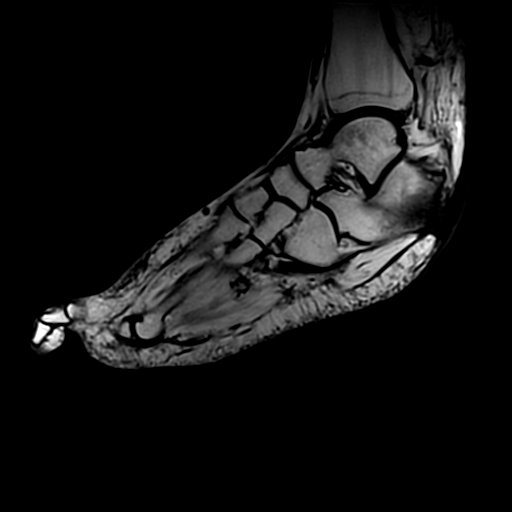
[im 13/22]
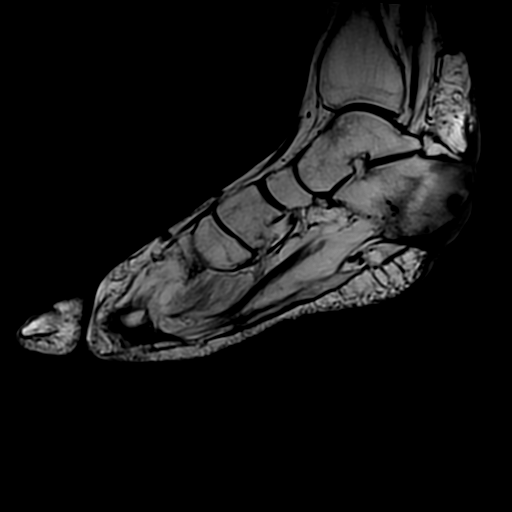
[im 17/22]
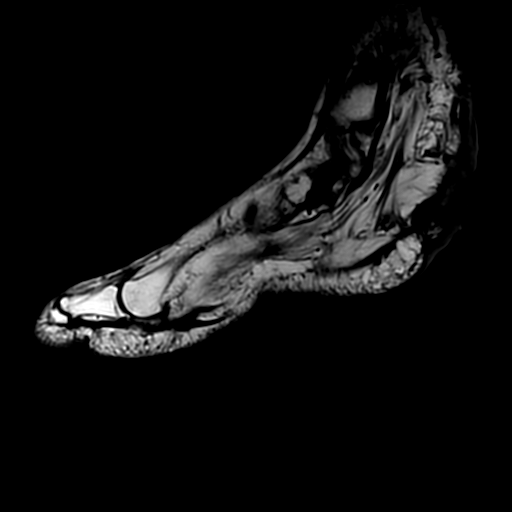
[im 22/22]
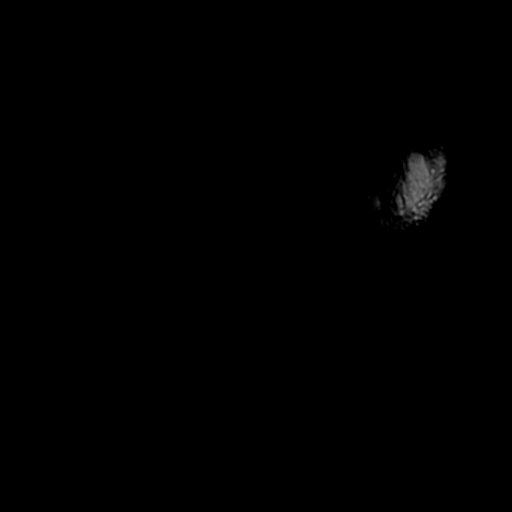

[Series 7: T1 · coronal · left · 5.2mm · 0.29mm/px · 4 of 30 slices shown (2 of 3)]
[im 1/30]
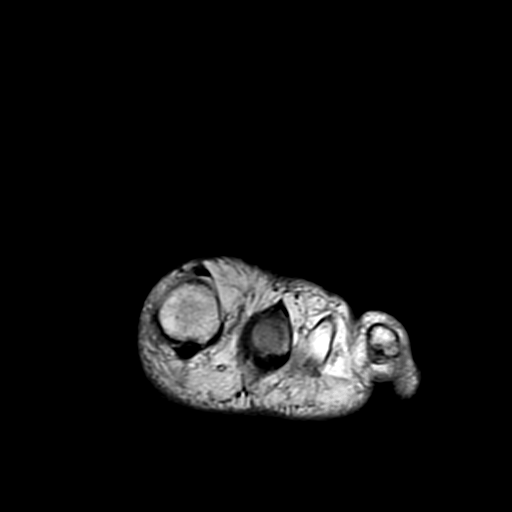
[im 5/30]
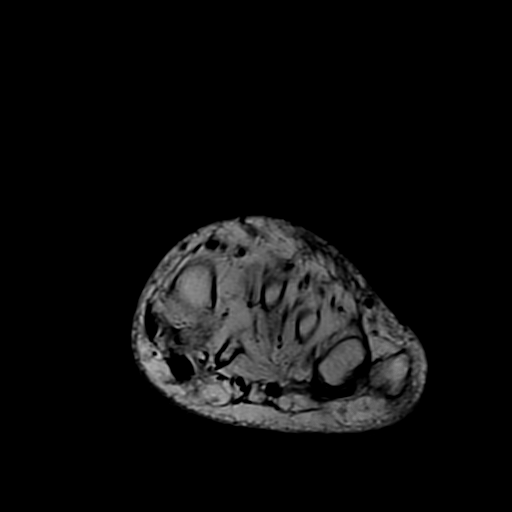
[im 17/30]
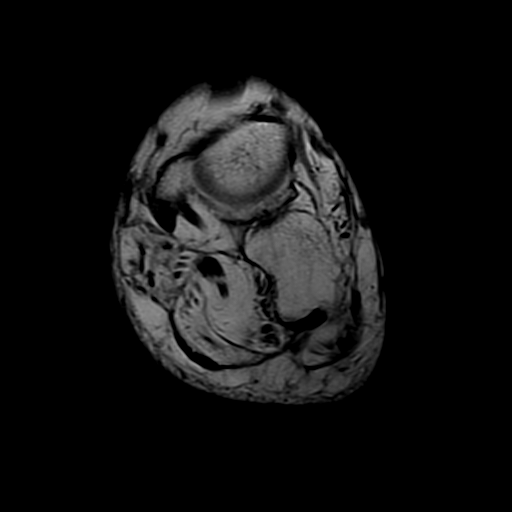
[im 25/30]
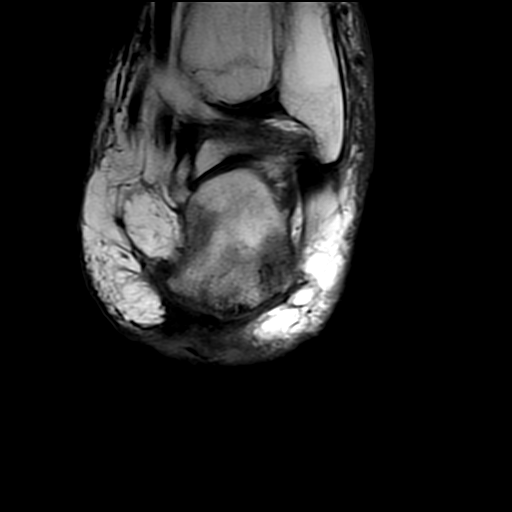

[Series 8: T1 · axial · left · 3.3mm · 0.49mm/px · z∈[+2,+76]mm · 3 of 24 slices shown (3 of 3)]
[im 5/24]
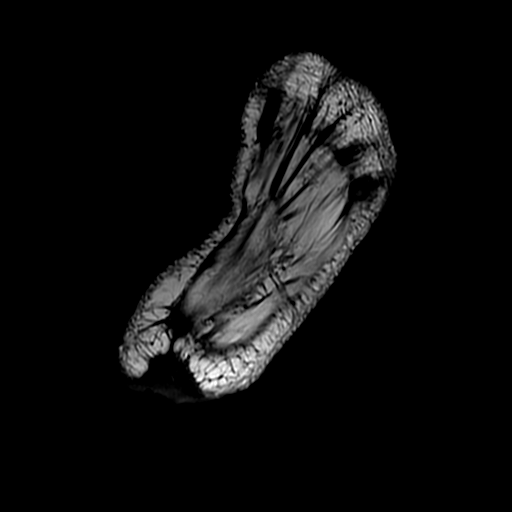
[im 14/24]
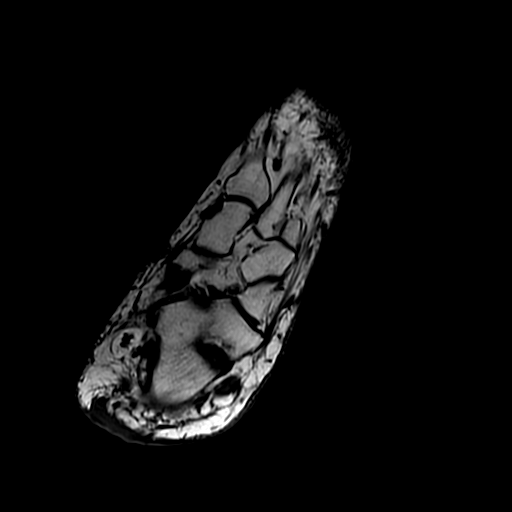
[im 24/24]
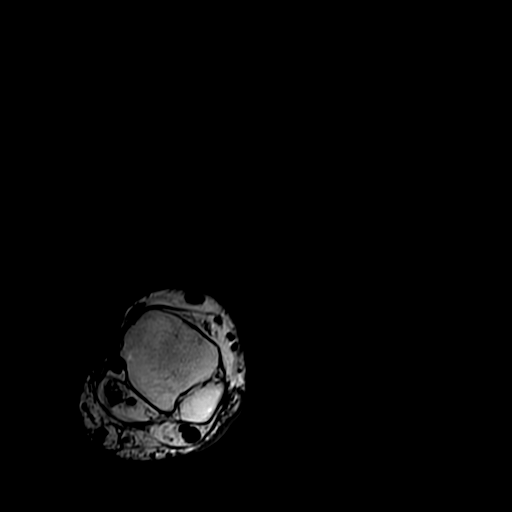

[Series 9: T2 fat-sat · axial · left · 3.3mm · 0.49mm/px · z∈[-14,+76]mm · 6 of 24 slices shown]
[im 1/24]
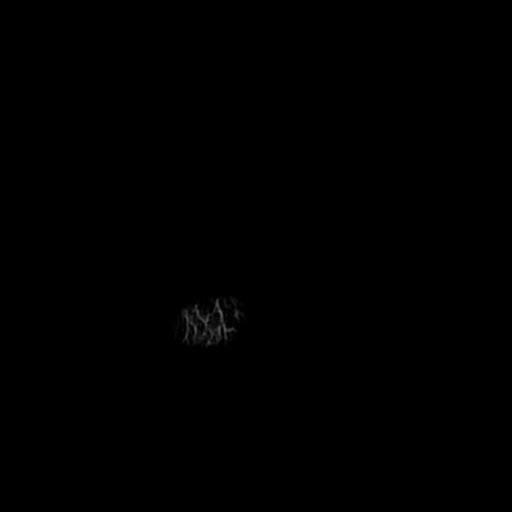
[im 5/24]
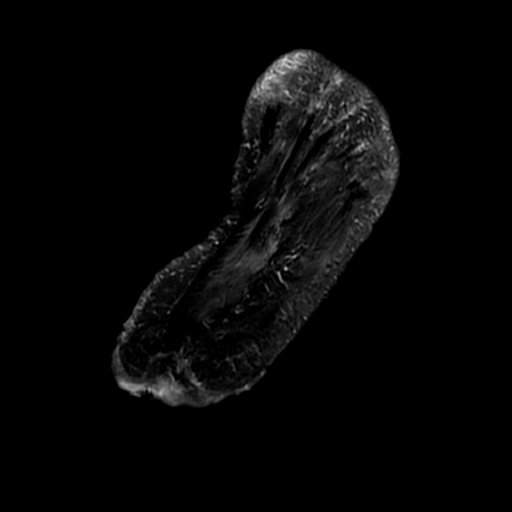
[im 10/24]
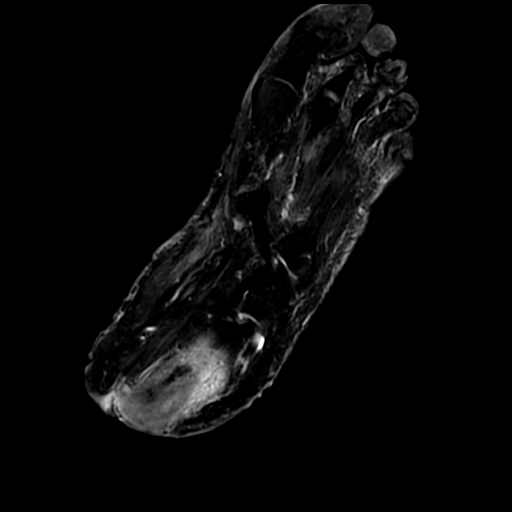
[im 14/24]
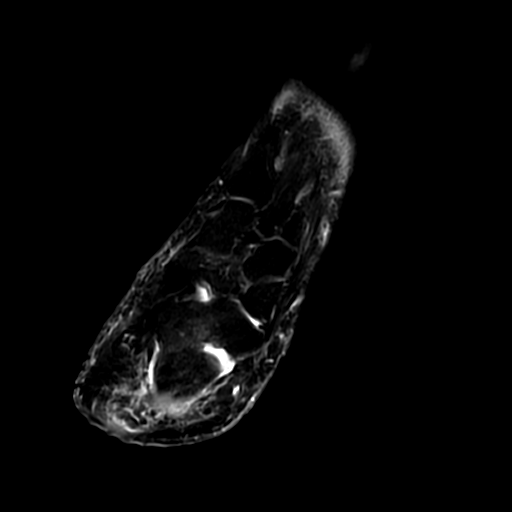
[im 19/24]
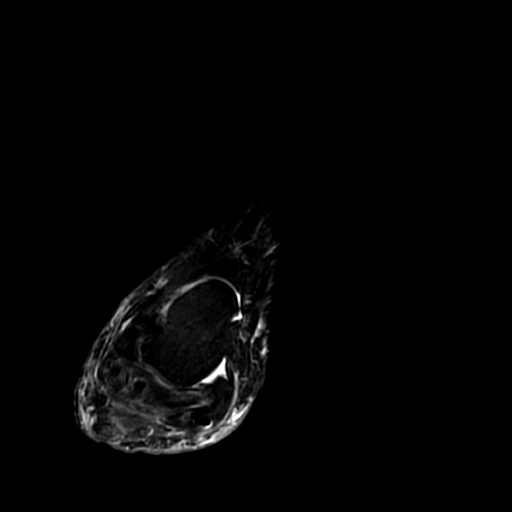
[im 24/24]
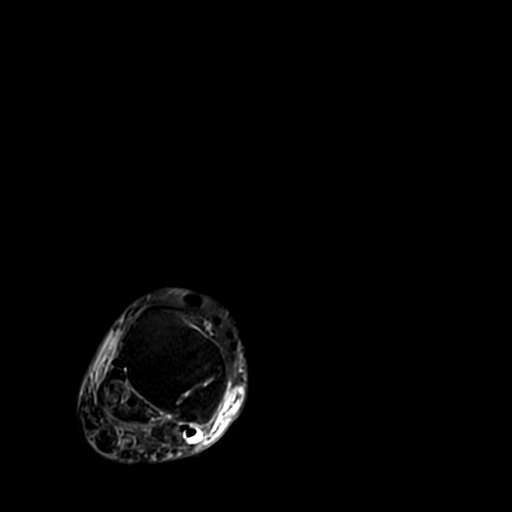

[19 of 40 positions shown; findings below may reference images not displayed]

FINDINGS: There is a large heel wound.  Confluent abnormal low T1 signal intensity is also seen within the posterior calcaneus consistent with osteomyelitis.  Achilles tendon is also completely torn and retracted.  There is mild peroneal tenosynovitis. Flexor and extensor tendons appear unremarkable.  Lisfranc ligament is normal. Normal T1 signal intensity is seen within the sinus tarsi. No significant arthritic changes are noted within the ankle and foot. There is marked fatty atrophy involving the intrinsic muscles of the forefoot.  The plantar aponeurosis is normal without fasciitis, fibromatosis or tear.
IMPRESSION: 1. Large heel wound with osteomyelitis of the posterior calcaneus. 

2. Marked fatty atrophy involving the intrinsic muscles of the forefoot.  

3. Full-thickness Achilles tendon tear.  

4. Mild peroneal tenosynovitis.

## 2021-11-05 ENCOUNTER — Encounter (INDEPENDENT_AMBULATORY_CARE_PROVIDER_SITE_OTHER): Payer: Self-pay | Admitting: Nephrology

## 2021-11-05 NOTE — Progress Notes (Signed)
61 year old gentleman with past medical history of diabetes type 2, on insulin, CKD stage IIIb, diabetic foot ulcer, here for follow-up CKD.  Patient reports that he still has edema in the left lower extremity.  He has been in good health.  He is due for cataract surgery and asking for clearance.  Patient denies nausea or vomiting.  No fevers or chills.  Recent lab work from November 20, 22nd indicates creatinine 2.2 GFR 33.  Patient denies dysuria.  Patient denies difficulty urinating.  Patient denies fevers or chills.  No nausea or vomiting.  Patient currently on Lasix.      Chronic kidney disease  -Stage IIIb  -Due to diabetes and hypertension  -Creatinine is stable 2.2  -Baseline creatinine 1.9-2.3  -We will check UA  -Total protein to creatinine ratio  -Albuminuria  -CBC and a basic metabolic panel  -Return to clinic in 71months  -Continue low-sodium diet  -Fluid restriction less than 40 ounces a day  -Avoid NSAIDs    CKD bone mineral disease  -Check PTH  -Vitamin D level    Cataract surgery clearance  -We will obtain lab work today  -Kidney function status we will optimize the patient for cataract surgery.    Diabetes type 2  -A1c goal is less than 7  -Continue current medications  -On insulin  -May need Farxiga  -Not on ACE inhibitor due to history of hyperkalemia.  -Diabetic diet  -Increase activity and exercise as possible    Diabetic foot ulcer  -Per primary team at the nursing home

## 2021-11-10 ENCOUNTER — Other Ambulatory Visit: Payer: Medicare PPO | Attending: INTERNAL MEDICINE | Admitting: INTERNAL MEDICINE

## 2021-11-10 DIAGNOSIS — N183 Chronic kidney disease, stage 3 unspecified: Secondary | ICD-10-CM | POA: Insufficient documentation

## 2021-11-10 DIAGNOSIS — R262 Difficulty in walking, not elsewhere classified: Secondary | ICD-10-CM

## 2021-11-10 DIAGNOSIS — Z20822 Contact with and (suspected) exposure to covid-19: Secondary | ICD-10-CM | POA: Insufficient documentation

## 2021-11-10 DIAGNOSIS — F1911 Other psychoactive substance abuse, in remission: Secondary | ICD-10-CM | POA: Insufficient documentation

## 2021-11-10 DIAGNOSIS — F1021 Alcohol dependence, in remission: Secondary | ICD-10-CM | POA: Insufficient documentation

## 2021-11-10 DIAGNOSIS — E1142 Type 2 diabetes mellitus with diabetic polyneuropathy: Secondary | ICD-10-CM | POA: Insufficient documentation

## 2021-11-10 DIAGNOSIS — I5020 Unspecified systolic (congestive) heart failure: Secondary | ICD-10-CM | POA: Insufficient documentation

## 2021-11-10 DIAGNOSIS — E11621 Type 2 diabetes mellitus with foot ulcer: Secondary | ICD-10-CM | POA: Insufficient documentation

## 2021-11-10 DIAGNOSIS — K2100 Gastro-esophageal reflux disease with esophagitis, without bleeding: Secondary | ICD-10-CM

## 2021-11-10 DIAGNOSIS — F339 Major depressive disorder, recurrent, unspecified: Secondary | ICD-10-CM

## 2021-11-10 DIAGNOSIS — R278 Other lack of coordination: Secondary | ICD-10-CM | POA: Insufficient documentation

## 2021-11-10 DIAGNOSIS — G609 Hereditary and idiopathic neuropathy, unspecified: Secondary | ICD-10-CM

## 2021-11-10 DIAGNOSIS — E1165 Type 2 diabetes mellitus with hyperglycemia: Secondary | ICD-10-CM | POA: Insufficient documentation

## 2021-11-11 LAB — CREATININE
CREATININE: 2.37 mg/dL — ABNORMAL HIGH (ref 0.60–1.30)
ESTIMATED GFR: 30 mL/min/{1.73_m2} — ABNORMAL LOW (ref >59)

## 2021-11-11 LAB — BUN: BUN: 40 mg/dL — ABNORMAL HIGH (ref 7–25)

## 2021-11-11 LAB — VANCOMYCIN, TROUGH: VANCOMYCIN TROUGH: 11.2 ug/mL — ABNORMAL HIGH (ref 5.0–10.0)

## 2021-11-12 ENCOUNTER — Other Ambulatory Visit (INDEPENDENT_AMBULATORY_CARE_PROVIDER_SITE_OTHER): Payer: Self-pay | Admitting: Nephrology

## 2021-11-12 ENCOUNTER — Encounter (INDEPENDENT_AMBULATORY_CARE_PROVIDER_SITE_OTHER): Payer: Self-pay | Admitting: Nephrology

## 2021-11-12 DIAGNOSIS — N1832 Chronic kidney disease, stage 3b (CMS HCC): Secondary | ICD-10-CM

## 2021-11-12 DIAGNOSIS — N189 Chronic kidney disease, unspecified: Secondary | ICD-10-CM

## 2021-11-12 DIAGNOSIS — E559 Vitamin D deficiency, unspecified: Secondary | ICD-10-CM

## 2021-11-16 ENCOUNTER — Other Ambulatory Visit: Payer: Medicare PPO | Attending: Internal Medicine | Admitting: Internal Medicine

## 2021-11-16 DIAGNOSIS — M868X7 Other osteomyelitis, ankle and foot: Secondary | ICD-10-CM | POA: Insufficient documentation

## 2021-11-16 DIAGNOSIS — Z792 Long term (current) use of antibiotics: Secondary | ICD-10-CM | POA: Insufficient documentation

## 2021-11-19 LAB — BUN/CREATININE RATIO
BUN/CREA RATIO: 13 (ref 6–22)
BUN: 30 mg/dL — ABNORMAL HIGH (ref 7–25)
CREATININE: 2.32 mg/dL — ABNORMAL HIGH (ref 0.60–1.30)
ESTIMATED GFR: 31 mL/min/1.73mˆ2 — ABNORMAL LOW (ref >59)

## 2021-11-19 LAB — VANCOMYCIN, TROUGH: VANCOMYCIN TROUGH: 8.3 ug/mL (ref 5.0–10.0)

## 2021-11-20 ENCOUNTER — Encounter (INDEPENDENT_AMBULATORY_CARE_PROVIDER_SITE_OTHER): Payer: Self-pay | Admitting: Nephrology

## 2021-11-22 ENCOUNTER — Other Ambulatory Visit: Payer: Medicare PPO | Attending: INTERNAL MEDICINE

## 2021-11-22 ENCOUNTER — Other Ambulatory Visit (HOSPITAL_COMMUNITY): Payer: Medicare PPO | Admitting: INTERNAL MEDICINE

## 2021-11-22 ENCOUNTER — Other Ambulatory Visit: Payer: Self-pay

## 2021-11-22 DIAGNOSIS — Z982 Presence of cerebrospinal fluid drainage device: Secondary | ICD-10-CM | POA: Insufficient documentation

## 2021-11-23 LAB — BUN: BUN: 33 mg/dL — ABNORMAL HIGH (ref 7–25)

## 2021-11-23 LAB — VANCOMYCIN, TROUGH: VANCOMYCIN TROUGH: 13.9 ug/mL — ABNORMAL HIGH (ref 5.0–10.0)

## 2021-11-23 LAB — CREATININE
CREATININE: 2.57 mg/dL — ABNORMAL HIGH (ref 0.60–1.30)
ESTIMATED GFR: 27 mL/min/{1.73_m2} — ABNORMAL LOW (ref >59)

## 2021-11-28 ENCOUNTER — Other Ambulatory Visit: Payer: Medicare PPO | Attending: INTERNAL MEDICINE

## 2021-11-28 ENCOUNTER — Other Ambulatory Visit: Payer: Self-pay

## 2021-11-28 ENCOUNTER — Other Ambulatory Visit (HOSPITAL_COMMUNITY): Payer: Medicare PPO | Admitting: INTERNAL MEDICINE

## 2021-11-28 DIAGNOSIS — Z79899 Other long term (current) drug therapy: Secondary | ICD-10-CM | POA: Insufficient documentation

## 2021-11-28 DIAGNOSIS — M869 Osteomyelitis, unspecified: Secondary | ICD-10-CM | POA: Insufficient documentation

## 2021-11-29 LAB — CREATININE
CREATININE: 2.6 mg/dL — ABNORMAL HIGH (ref 0.60–1.30)
ESTIMATED GFR: 27 mL/min/{1.73_m2} — ABNORMAL LOW (ref >59)

## 2021-11-29 LAB — VANCOMYCIN, TROUGH: VANCOMYCIN TROUGH: 14.1 ug/mL — ABNORMAL HIGH (ref 5.0–10.0)

## 2021-11-29 LAB — BUN: BUN: 38 mg/dL — ABNORMAL HIGH (ref 7–25)

## 2021-12-06 ENCOUNTER — Other Ambulatory Visit: Payer: Medicare PPO | Attending: INTERNAL MEDICINE

## 2021-12-06 ENCOUNTER — Other Ambulatory Visit: Payer: Self-pay

## 2021-12-06 DIAGNOSIS — L97509 Non-pressure chronic ulcer of other part of unspecified foot with unspecified severity: Secondary | ICD-10-CM | POA: Insufficient documentation

## 2021-12-06 DIAGNOSIS — E11621 Type 2 diabetes mellitus with foot ulcer: Secondary | ICD-10-CM | POA: Insufficient documentation

## 2021-12-06 LAB — BUN: BUN: 34 mg/dL — ABNORMAL HIGH (ref 7–18)

## 2021-12-06 LAB — CREATININE WITH ESTIMATED GFR
CREATININE: 2.5 mg/dL — ABNORMAL HIGH (ref 0.70–1.30)
ESTIMATED GFR: 28 mL/min/{1.73_m2} — ABNORMAL LOW (ref >59)

## 2021-12-07 LAB — VANCOMYCIN, TROUGH: VANCOMYCIN TROUGH: 9.9 ug/mL (ref 5.0–10.0)

## 2021-12-10 ENCOUNTER — Other Ambulatory Visit: Payer: Medicare PPO | Attending: INTERNAL MEDICINE

## 2021-12-10 ENCOUNTER — Other Ambulatory Visit (HOSPITAL_BASED_OUTPATIENT_CLINIC_OR_DEPARTMENT_OTHER): Payer: Medicare PPO

## 2021-12-10 ENCOUNTER — Other Ambulatory Visit: Payer: Self-pay

## 2021-12-10 DIAGNOSIS — M868X7 Other osteomyelitis, ankle and foot: Secondary | ICD-10-CM | POA: Insufficient documentation

## 2021-12-11 LAB — CREATININE WITH ESTIMATED GFR
CREATININE: 2.47 mg/dL — ABNORMAL HIGH (ref 0.60–1.30)
ESTIMATED GFR: 29 mL/min/{1.73_m2} — ABNORMAL LOW (ref >59)

## 2021-12-11 LAB — BUN: BUN: 31 mg/dL — ABNORMAL HIGH (ref 7–25)

## 2021-12-11 LAB — LAVENDER TOP TUBE

## 2021-12-11 LAB — VANCOMYCIN, TROUGH: VANCOMYCIN TROUGH: 13.2 ug/mL — ABNORMAL HIGH (ref 5.0–10.0)

## 2021-12-12 LAB — WOUND, SUPERFICIAL/NON-STERILE SITE, AEROBIC CULTURE AND GRAM STAIN: GRAM STAIN: NONE SEEN

## 2021-12-13 LAB — WOUND, SUPERFICIAL/NON-STERILE SITE, AEROBIC CULTURE AND GRAM STAIN

## 2021-12-16 ENCOUNTER — Other Ambulatory Visit: Payer: Medicare PPO | Attending: INTERNAL MEDICINE | Admitting: INTERNAL MEDICINE

## 2021-12-16 DIAGNOSIS — M868X7 Other osteomyelitis, ankle and foot: Secondary | ICD-10-CM | POA: Insufficient documentation

## 2021-12-17 LAB — VANCOMYCIN, TROUGH: VANCOMYCIN TROUGH: 15.6 ug/mL — ABNORMAL HIGH (ref 5.0–10.0)

## 2021-12-17 LAB — CREATININE WITH ESTIMATED GFR
CREATININE: 2.77 mg/dL — ABNORMAL HIGH (ref 0.60–1.30)
ESTIMATED GFR: 25 mL/min/{1.73_m2} — ABNORMAL LOW (ref >59)

## 2021-12-17 LAB — BUN: BUN: 36 mg/dL — ABNORMAL HIGH (ref 7–25)

## 2021-12-24 ENCOUNTER — Other Ambulatory Visit: Payer: Medicare PPO | Attending: INTERNAL MEDICINE | Admitting: INTERNAL MEDICINE

## 2021-12-24 DIAGNOSIS — M868X7 Other osteomyelitis, ankle and foot: Secondary | ICD-10-CM | POA: Insufficient documentation

## 2021-12-24 LAB — LIGHT GREEN TOP TUBE

## 2021-12-24 LAB — CREATININE
CREATININE: 2.63 mg/dL — ABNORMAL HIGH (ref 0.60–1.30)
ESTIMATED GFR: 27 mL/min/{1.73_m2} — ABNORMAL LOW (ref >59)

## 2021-12-24 LAB — BUN: BUN: 43 mg/dL — ABNORMAL HIGH (ref 7–25)

## 2022-01-04 ENCOUNTER — Other Ambulatory Visit: Payer: Medicare PPO | Attending: INTERNAL MEDICINE | Admitting: INTERNAL MEDICINE

## 2022-01-04 DIAGNOSIS — Z91148 Patient's other noncompliance with medication regimen for other reason: Secondary | ICD-10-CM

## 2022-01-05 LAB — CBC WITH DIFF
BASOPHIL #: 0.1 10*3/uL (ref 0.00–0.30)
BASOPHIL %: 1 % (ref 0–3)
EOSINOPHIL #: 0.4 10*3/uL (ref 0.00–0.80)
EOSINOPHIL %: 3 % (ref 0–7)
HCT: 33.1 % — ABNORMAL LOW (ref 42.0–51.0)
HGB: 11.5 g/dL — ABNORMAL LOW (ref 13.5–18.0)
LYMPHOCYTE #: 1.5 10*3/uL (ref 1.10–5.00)
LYMPHOCYTE %: 12 % — ABNORMAL LOW (ref 25–45)
MCH: 28.9 pg (ref 27.0–32.0)
MCHC: 34.7 g/dL (ref 32.0–36.0)
MCV: 83.4 fL (ref 78.0–99.0)
MONOCYTE #: 0.7 10*3/uL (ref 0.00–1.30)
MONOCYTE %: 5 % (ref 0–12)
MPV: 9.9 fL (ref 7.4–10.4)
NEUTROPHIL #: 10.1 10*3/uL — ABNORMAL HIGH (ref 1.80–8.40)
NEUTROPHIL %: 79 % — ABNORMAL HIGH (ref 40–76)
PLATELETS: 173 10*3/uL (ref 140–440)
RBC: 3.97 10*6/uL — ABNORMAL LOW (ref 4.20–6.00)
RDW: 14.5 % (ref 11.6–14.8)
WBC: 12.7 10*3/uL — ABNORMAL HIGH (ref 4.0–10.5)
WBCS UNCORRECTED: 12.7 10*3/uL

## 2022-01-05 LAB — COMPREHENSIVE METABOLIC PANEL, NON-FASTING
ALBUMIN/GLOBULIN RATIO: 1 (ref 0.8–1.4)
ALBUMIN: 3.5 g/dL (ref 3.5–5.7)
ALKALINE PHOSPHATASE: 118 U/L — ABNORMAL HIGH (ref 34–104)
ALT (SGPT): <7 U/L — ABNORMAL LOW (ref 7–52)
ANION GAP: 14 mmol/L (ref 10–20)
AST (SGOT): 9 U/L — ABNORMAL LOW (ref 13–39)
BILIRUBIN TOTAL: 0.4 mg/dL (ref 0.3–1.2)
BUN/CREA RATIO: 16 (ref 6–22)
BUN: 46 mg/dL — ABNORMAL HIGH (ref 7–25)
CALCIUM, CORRECTED: 9.1 mg/dL (ref 8.9–10.8)
CALCIUM: 8.6 mg/dL (ref 8.6–10.3)
CHLORIDE: 106 mmol/L (ref 98–107)
CO2 TOTAL: 18 mmol/L — ABNORMAL LOW (ref 21–31)
CREATININE: 2.9 mg/dL — ABNORMAL HIGH (ref 0.60–1.30)
ESTIMATED GFR: 24 mL/min/{1.73_m2} — ABNORMAL LOW (ref >59)
GLOBULIN: 3.4 (ref 2.9–5.4)
GLUCOSE: 230 mg/dL — ABNORMAL HIGH (ref 74–109)
OSMOLALITY, CALCULATED: 295 mOsm/kg — ABNORMAL HIGH (ref 270–290)
POTASSIUM: 4.3 mmol/L (ref 3.5–5.1)
PROTEIN TOTAL: 6.9 g/dL (ref 6.4–8.9)
SODIUM: 138 mmol/L (ref 136–145)

## 2022-01-05 LAB — HGA1C (HEMOGLOBIN A1C WITH EST AVG GLUCOSE): HEMOGLOBIN A1C: 8.6 % — ABNORMAL HIGH (ref 4.0–6.0)

## 2022-01-08 ENCOUNTER — Other Ambulatory Visit: Payer: Medicare PPO | Attending: INTERNAL MEDICINE | Admitting: INTERNAL MEDICINE

## 2022-01-08 DIAGNOSIS — Z79899 Other long term (current) drug therapy: Secondary | ICD-10-CM

## 2022-01-08 LAB — C-REACTIVE PROTEIN (CRP): C-REACTIVE PROTEIN (CRP): 3 mg/dL — ABNORMAL HIGH (ref 0.1–0.5)

## 2022-01-08 LAB — CBC WITH DIFF
BASOPHIL #: 0.1 10*3/uL (ref 0.00–0.30)
BASOPHIL %: 1 % (ref 0–3)
EOSINOPHIL #: 0.4 10*3/uL (ref 0.00–0.80)
EOSINOPHIL %: 4 % (ref 0–7)
HCT: 33.8 % — ABNORMAL LOW (ref 42.0–51.0)
HGB: 11.7 g/dL — ABNORMAL LOW (ref 13.5–18.0)
LYMPHOCYTE #: 1.4 10*3/uL (ref 1.10–5.00)
LYMPHOCYTE %: 13 % — ABNORMAL LOW (ref 25–45)
MCH: 29.1 pg (ref 27.0–32.0)
MCHC: 34.7 g/dL (ref 32.0–36.0)
MCV: 84 fL (ref 78.0–99.0)
MONOCYTE #: 0.6 10*3/uL (ref 0.00–1.30)
MONOCYTE %: 6 % (ref 0–12)
MPV: 10.3 fL (ref 7.4–10.4)
NEUTROPHIL #: 7.8 10*3/uL (ref 1.80–8.40)
NEUTROPHIL %: 76 % (ref 40–76)
PLATELETS: 184 10*3/uL (ref 140–440)
RBC: 4.02 10*6/uL — ABNORMAL LOW (ref 4.20–6.00)
RDW: 14.2 % (ref 11.6–14.8)
WBC: 10.2 10*3/uL (ref 4.0–10.5)
WBCS UNCORRECTED: 10.2 10*3/uL

## 2022-01-08 LAB — SEDIMENTATION RATE: ERYTHROCYTE SEDIMENTATION RATE (ESR): 68 mm/hr — ABNORMAL HIGH (ref <=20)

## 2022-01-08 LAB — CREATININE
CREATININE: 2.59 mg/dL — ABNORMAL HIGH (ref 0.60–1.30)
ESTIMATED GFR: 27 mL/min/{1.73_m2} — ABNORMAL LOW (ref >59)

## 2022-01-08 LAB — BUN: BUN: 42 mg/dL — ABNORMAL HIGH (ref 7–25)

## 2022-01-16 ENCOUNTER — Other Ambulatory Visit (HOSPITAL_COMMUNITY): Payer: Self-pay

## 2022-01-16 ENCOUNTER — Other Ambulatory Visit: Payer: Medicare PPO | Attending: INTERNAL MEDICINE | Admitting: INTERNAL MEDICINE

## 2022-01-16 DIAGNOSIS — M869 Osteomyelitis, unspecified: Secondary | ICD-10-CM

## 2022-01-16 DIAGNOSIS — Z5181 Encounter for therapeutic drug level monitoring: Secondary | ICD-10-CM | POA: Insufficient documentation

## 2022-01-16 LAB — CREATININE
CREATININE: 2.83 mg/dL — ABNORMAL HIGH (ref 0.60–1.30)
ESTIMATED GFR: 24 mL/min/{1.73_m2} — ABNORMAL LOW (ref >59)

## 2022-01-16 LAB — BUN: BUN: 33 mg/dL — ABNORMAL HIGH (ref 7–25)

## 2022-01-16 LAB — VANCOMYCIN, TROUGH: VANCOMYCIN TROUGH: 9.2 ug/mL (ref 5.0–10.0)

## 2022-01-17 ENCOUNTER — Inpatient Hospital Stay
Admission: RE | Admit: 2022-01-17 | Discharge: 2022-01-17 | Disposition: A | Discharge status: Home / Self Care | Payer: Medicare PPO | Source: Ambulatory Visit | Attending: Vascular & Interventional Radiology | Admitting: Vascular & Interventional Radiology

## 2022-01-17 ENCOUNTER — Other Ambulatory Visit: Payer: Self-pay

## 2022-01-17 DIAGNOSIS — M869 Osteomyelitis, unspecified: Secondary | ICD-10-CM | POA: Insufficient documentation

## 2022-01-17 DIAGNOSIS — Z452 Encounter for adjustment and management of vascular access device: Secondary | ICD-10-CM | POA: Insufficient documentation

## 2022-01-21 ENCOUNTER — Ambulatory Visit (HOSPITAL_COMMUNITY): Payer: Self-pay

## 2022-01-22 ENCOUNTER — Ambulatory Visit (HOSPITAL_COMMUNITY): Payer: Self-pay

## 2022-01-24 ENCOUNTER — Other Ambulatory Visit (HOSPITAL_COMMUNITY): Payer: Self-pay

## 2022-01-24 ENCOUNTER — Other Ambulatory Visit: Payer: Medicare PPO

## 2022-01-24 DIAGNOSIS — H05029 Osteomyelitis of unspecified orbit: Secondary | ICD-10-CM

## 2022-01-24 DIAGNOSIS — Z79899 Other long term (current) drug therapy: Secondary | ICD-10-CM

## 2022-01-24 LAB — BUN: BUN: 37 mg/dL — ABNORMAL HIGH (ref 7–25)

## 2022-01-24 LAB — CREATININE WITH EGFR
CREATININE: 2.7 mg/dL — ABNORMAL HIGH (ref 0.60–1.30)
ESTIMATED GFR: 26 mL/min/{1.73_m2} — ABNORMAL LOW (ref >59)

## 2022-01-24 LAB — VANCOMYCIN, TROUGH: VANCOMYCIN TROUGH: 17.6 ug/mL — ABNORMAL HIGH (ref 5.0–10.0)

## 2022-02-06 ENCOUNTER — Ambulatory Visit (INDEPENDENT_AMBULATORY_CARE_PROVIDER_SITE_OTHER): Payer: Medicare PPO | Admitting: Nephrology

## 2022-02-06 ENCOUNTER — Encounter (INDEPENDENT_AMBULATORY_CARE_PROVIDER_SITE_OTHER): Payer: Self-pay | Admitting: Nephrology

## 2022-02-06 ENCOUNTER — Other Ambulatory Visit: Payer: Self-pay

## 2022-02-06 VITALS — BP 123/80 | HR 85 | Ht 71.0 in | Wt 228.0 lb

## 2022-02-06 DIAGNOSIS — L97529 Non-pressure chronic ulcer of other part of left foot with unspecified severity: Secondary | ICD-10-CM

## 2022-02-06 DIAGNOSIS — I10 Essential (primary) hypertension: Secondary | ICD-10-CM

## 2022-02-06 DIAGNOSIS — E11621 Type 2 diabetes mellitus with foot ulcer: Secondary | ICD-10-CM

## 2022-02-06 DIAGNOSIS — E1121 Type 2 diabetes mellitus with diabetic nephropathy: Secondary | ICD-10-CM

## 2022-02-06 DIAGNOSIS — E1122 Type 2 diabetes mellitus with diabetic chronic kidney disease: Secondary | ICD-10-CM

## 2022-02-06 DIAGNOSIS — I129 Hypertensive chronic kidney disease with stage 1 through stage 4 chronic kidney disease, or unspecified chronic kidney disease: Secondary | ICD-10-CM

## 2022-02-06 DIAGNOSIS — N184 Chronic kidney disease, stage 4 (severe): Secondary | ICD-10-CM

## 2022-02-06 DIAGNOSIS — Z794 Long term (current) use of insulin: Secondary | ICD-10-CM

## 2022-02-06 MED ORDER — DAPAGLIFLOZIN PROPANEDIOL 10 MG TABLET
10.0000 mg | ORAL_TABLET | Freq: Every day | ORAL | 3 refills | Status: AC
Start: 2022-02-06 — End: 2023-02-06

## 2022-02-06 NOTE — Progress Notes (Signed)
[place], [facility]    Progress Note    Name: [name] [name] MRN:  [id number]   Date: 02/06/2022 Age: 61 y.o.          Nephrology Follow Up Visit          HPI: 61 y.o. gentleman with past medical history of diabetes type 2, on insulin, CKD stage 4, diabetic foot ulcer, here for follow-up CKD.      Patient has left diabetic foot ulcer currently finished IV antibiotic course, he was on vancomycin, patient has right arm PICC line.  Patient reports improvement in edema.  No nausea no vomiting.  No abdominal pain.  No diarrhea.    Discussed with the patient the importance of starting [place] discussed with him the risks and benefits he is agreeable to start it.        01/24/2022 creatinine down to 2.7 GFR up to 26.  lab work from November 20, 22nd indicates creatinine 2.2 GFR 33.    Patient denies dysuria.  Patient denies difficulty urinating.      ROS:         Systematic review of 12 organ systems was negative except what mentioned in in the HPI.      OBJECTIVE:   BP 123/80 (Site: Upper Extremity, Patient Position: Sitting, Cuff Size: Adult)   Pulse 85   Ht 1.803 m ('5\' 11"'$ )   Wt 103 kg (228 lb)   BMI 31.80 kg/m       General:  NAD, AAOx3  HEENT:  EOMI, MMM, no pallor, no icterus  NECK: No increased JVD.    HEART: Normal S1 and S2. Regular rhythm. No murmurs or rubs.   LUNGS: Clear to auscultation bilateral. No wheezes, rales, or rhonchi.   ABDOMEN: +BS, Soft, nontender and nondistended. No rebound or guarding present.   EXTREMITIES: No edema. No asterixis  .  Left foot wrapped.  NEURO : moving all extremities. emoi  SKIN: No obvious skin rashes.    LABORATORY DATA:   Lab Results   Component Value Date    BUN 37 (H) 01/24/2022    BUN 33 (H) 01/16/2022    BUN 42 (H) 01/08/2022    CREATININE 2.70 (H) 01/24/2022    CREATININE 2.83 (H) 01/16/2022    CREATININE 2.59 (H) 01/08/2022    BUNCRRATIO 16 01/04/2022    BUNCRRATIO 13 11/16/2021    BUNCRRATIO 10 04/10/2020    GFR 26 (L) 01/24/2022     GFR 24 (L) 01/16/2022    GFR 27 (L) 01/08/2022    SODIUM 138 01/04/2022    SODIUM 138 04/10/2020    SODIUM 139 04/09/2020    POTASSIUM 4.3 01/04/2022    POTASSIUM 4.4 04/10/2020    POTASSIUM 4.3 04/09/2020    CHLORIDE 106 01/04/2022    CHLORIDE 112 (H) 04/10/2020    CHLORIDE 111 04/09/2020    CO2 18 (L) 01/04/2022    CO2 22 (L) 04/10/2020    CO2 23 04/09/2020    ANIONGAP 14 01/04/2022    ANIONGAP 4 04/10/2020    ANIONGAP 5 04/09/2020    CALCIUM 8.6 01/04/2022    CALCIUM 8.6 (L) 04/10/2020    CALCIUM 8.8 04/09/2020    PHOSPHORUS 3.5 04/10/2020    PHOSPHORUS 3.4 04/09/2020    PHOSPHORUS 3.4 04/08/2020    ALBUMIN 3.5 01/04/2022    ALBUMIN 2.5 (L) 04/24/2020    ALBUMIN 1.8 (L) 04/01/2020    HGB 11.7 (L) 01/08/2022    HGB  11.5 (L) 01/04/2022    HGB 9.8 05/10/2020    HCT 33.8 (L) 01/08/2022    HCT 33.1 (L) 01/04/2022    HCT 30 05/10/2020    IRON 46 (L) 08/14/2019    IRONBINDCAP 160 (L) 08/14/2019    IRONSAT 29 08/14/2019    FERRITIN 314 (H) 08/14/2019       Lab Results   Component Value Date    HA1C 8.6 (H) 01/04/2022    HA1C 8.5 (H) 04/01/2020    HA1C 8.8 (H) 08/11/2019    URICACID 6.6 04/01/2020            MEDICATIONS:  Outpatient Medications Marked as Taking for the 02/06/22 encounter (Office Visit) with Beather Arbour, MD   Medication Sig   . dapagliflozin (FARXIGA) 10 mg Oral Tablet Take 1 Tablet (10 mg total) by mouth Once a day     Current Outpatient Medications   Medication Instructions   . dapagliflozin (FARXIGA) 10 mg, Oral, DAILY   . famotidine (PEPCID) 40 mg, Oral, EVERY EVENING   . insulin glargine 25 Units, Subcutaneous, NIGHTLY   . insulin lispro (HUMALOG) 5 Units, Subcutaneous, 3 TIMES DAILY BEFORE MEALS   . oxyCODONE (ROXICODONE) 15 mg, Oral, EVERY 4 HOURS PRN   . pantoprazole (PROTONIX) 40 mg, Oral, DAILY   . pregabalin (LYRICA) 100 mg, Oral, 3 TIMES DAILY   . Saccharomyces boulardii (FLORASTOR) 250 mg, Oral, 2 TIMES DAILY   . sennosides-docusate sodium (SENOKOT-S) 8.6-50 mg Oral Tablet 2 Tablets, Oral, 2  TIMES DAILY         ASSESSMENT / PLAN:   ENCOUNTER DIAGNOSES     ICD-10-CM   1. CKD (chronic kidney disease) stage 4, GFR 15-29 ml/min (CMS HCC)  N18.4                     Chronic kidney disease  -Stage IV  -Due to diabetes and hypertension  -Creatinine is 2.7  -Baseline creatinine 2.4  -Total protein to creatinine ratio  -UACR  -CBC and a basic metabolic panel  -Return to clinic in 2 months  -Continue low-sodium diet  -Fluid restriction less than 40 ounces a day  -Avoid NSAIDs  -Wilder Glade will be started 10 daily.  Risks and benefits were discussed with the patient.    CKD bone mineral disease  -Check PTH  -Vitamin D level    Cataract surgery clearance  -patient is considered low risk from Nephrology standpoint for cataract surgery.    Diabetes type 2  -A1c goal is less than 7  -Continue current medications  -On insulin  -add Wilder Glade  -Not on ACE inhibitor due to history of hyperkalemia.  -Diabetic diet  -Increase activity and exercise as possible    Diabetic foot ulcer  -Per primary team at the nursing home  -finish the antibiotics.  -follow up with Dr. Mallie Mussel      Medication list was reviewed with the patient the best of his knowledge, requested nursing home to fax [place] the current medication list.

## 2022-02-06 NOTE — Addendum Note (Signed)
Addended byBeather Arbour on: 02/06/2022 09:44 AM     Modules accepted: Orders

## 2022-02-21 ENCOUNTER — Other Ambulatory Visit: Payer: Medicare PPO

## 2022-02-21 DIAGNOSIS — H0510 Unspecified chronic inflammatory disorders of orbit: Secondary | ICD-10-CM | POA: Insufficient documentation

## 2022-02-21 LAB — C-REACTIVE PROTEIN (CRP): C-REACTIVE PROTEIN (CRP): 6.9 mg/dL — ABNORMAL HIGH (ref 0.1–0.5)

## 2022-02-21 LAB — SEDIMENTATION RATE: ERYTHROCYTE SEDIMENTATION RATE (ESR): 44 mm/hr — ABNORMAL HIGH (ref <20)

## 2022-02-26 ENCOUNTER — Other Ambulatory Visit (HOSPITAL_COMMUNITY): Payer: Self-pay | Admitting: NURSE PRACTITIONER

## 2022-02-26 DIAGNOSIS — M869 Osteomyelitis, unspecified: Secondary | ICD-10-CM

## 2022-03-07 ENCOUNTER — Ambulatory Visit (HOSPITAL_COMMUNITY): Payer: Self-pay

## 2022-03-07 ENCOUNTER — Other Ambulatory Visit (HOSPITAL_COMMUNITY): Payer: Medicare PPO

## 2022-03-14 ENCOUNTER — Ambulatory Visit (HOSPITAL_COMMUNITY): Payer: Self-pay

## 2022-03-14 ENCOUNTER — Other Ambulatory Visit (HOSPITAL_COMMUNITY): Payer: Medicare PPO

## 2022-04-04 ENCOUNTER — Other Ambulatory Visit (HOSPITAL_COMMUNITY): Payer: Self-pay

## 2022-04-04 DIAGNOSIS — M25511 Pain in right shoulder: Secondary | ICD-10-CM

## 2022-04-10 ENCOUNTER — Other Ambulatory Visit: Payer: Self-pay

## 2022-04-10 ENCOUNTER — Other Ambulatory Visit: Payer: Medicare PPO | Attending: Nephrology

## 2022-04-10 ENCOUNTER — Other Ambulatory Visit (INDEPENDENT_AMBULATORY_CARE_PROVIDER_SITE_OTHER): Payer: Medicare PPO

## 2022-04-10 DIAGNOSIS — E1121 Type 2 diabetes mellitus with diabetic nephropathy: Secondary | ICD-10-CM | POA: Insufficient documentation

## 2022-04-10 DIAGNOSIS — N1832 Chronic kidney disease, stage 3b: Secondary | ICD-10-CM | POA: Insufficient documentation

## 2022-04-10 DIAGNOSIS — I10 Essential (primary) hypertension: Secondary | ICD-10-CM | POA: Insufficient documentation

## 2022-04-10 DIAGNOSIS — N184 Chronic kidney disease, stage 4 (severe): Secondary | ICD-10-CM | POA: Insufficient documentation

## 2022-04-10 DIAGNOSIS — N189 Chronic kidney disease, unspecified: Secondary | ICD-10-CM | POA: Insufficient documentation

## 2022-04-10 DIAGNOSIS — Z794 Long term (current) use of insulin: Secondary | ICD-10-CM | POA: Insufficient documentation

## 2022-04-10 DIAGNOSIS — E559 Vitamin D deficiency, unspecified: Secondary | ICD-10-CM

## 2022-04-10 LAB — CBC WITH DIFF
BASOPHIL #: 0.1 10*3/uL (ref 0.00–0.30)
BASOPHIL %: 1 % (ref 0–3)
EOSINOPHIL #: 0.2 10*3/uL (ref 0.00–0.80)
EOSINOPHIL %: 2 % (ref 0–7)
HCT: 37.8 % — ABNORMAL LOW (ref 42.0–51.0)
HGB: 12.7 g/dL — ABNORMAL LOW (ref 13.5–18.0)
LYMPHOCYTE #: 1.1 10*3/uL (ref 1.10–5.00)
LYMPHOCYTE %: 11 % — ABNORMAL LOW (ref 25–45)
MCH: 28.4 pg (ref 27.0–32.0)
MCHC: 33.5 g/dL (ref 32.0–36.0)
MCV: 84.8 fL (ref 78.0–99.0)
MONOCYTE #: 0.5 10*3/uL (ref 0.00–1.30)
MONOCYTE %: 5 % (ref 0–12)
MPV: 9.5 fL (ref 7.4–10.4)
NEUTROPHIL #: 8.3 10*3/uL (ref 1.80–8.40)
NEUTROPHIL %: 81 % — ABNORMAL HIGH (ref 40–76)
PLATELETS: 223 10*3/uL (ref 140–440)
RBC: 4.46 10*6/uL (ref 4.20–6.00)
RDW: 14.8 % (ref 11.6–14.8)
WBC: 10.2 10*3/uL (ref 4.0–10.5)
WBCS UNCORRECTED: 10.2 10*3/uL

## 2022-04-10 LAB — MAGNESIUM: MAGNESIUM: 2.3 mg/dL (ref 1.9–2.7)

## 2022-04-10 LAB — VITAMIN D 25 TOTAL: VITAMIN D: 11 ng/mL — ABNORMAL LOW (ref 30–100)

## 2022-04-10 LAB — BASIC METABOLIC PANEL
ANION GAP: 10 mmol/L (ref 10–20)
BUN/CREA RATIO: 13 (ref 6–22)
BUN: 36 mg/dL — ABNORMAL HIGH (ref 7–25)
CALCIUM: 9.3 mg/dL (ref 8.6–10.3)
CHLORIDE: 105 mmol/L (ref 98–107)
CO2 TOTAL: 25 mmol/L (ref 21–31)
CREATININE: 2.87 mg/dL — ABNORMAL HIGH (ref 0.60–1.30)
ESTIMATED GFR: 24 mL/min/{1.73_m2} — ABNORMAL LOW (ref >59)
GLUCOSE: 172 mg/dL — ABNORMAL HIGH (ref 74–109)
OSMOLALITY, CALCULATED: 292 mOsm/kg — ABNORMAL HIGH (ref 270–290)
POTASSIUM: 4 mmol/L (ref 3.5–5.1)
SODIUM: 140 mmol/L (ref 136–145)

## 2022-04-10 LAB — PROTEIN/CREATININE RATIO, URINE, RANDOM
CREATININE RANDOM URINE: 67 mg/dL — ABNORMAL HIGH (ref 11–26)
PROTEIN RANDOM URINE: 343 mg/dL — ABNORMAL HIGH (ref 50–80)
PROTEIN/CREATININE RATIO RANDOM URINE: 5.119 mg/mg (ref 0.000–200.000)

## 2022-04-10 LAB — MICROALBUMIN/CREATININE RATIO, URINE, RANDOM
CREATININE RANDOM URINE: 67 mg/dL — ABNORMAL HIGH (ref 11–26)
MICROALBUMIN RANDOM URINE: 93.3 mg/dL
MICROALBUMIN/CREATININE RATIO RANDOM URINE: 1392.5 mg/g

## 2022-04-10 LAB — HGA1C (HEMOGLOBIN A1C WITH EST AVG GLUCOSE): HEMOGLOBIN A1C: 8.8 % — ABNORMAL HIGH (ref 4.0–6.0)

## 2022-04-10 LAB — MICROALBUMIN URINE, RANDOM: MICROALBUMIN RANDOM URINE: 93.3 mg/dL

## 2022-04-10 LAB — PARATHYROID HORMONE (PTH): PTH: 373.7 pg/mL — ABNORMAL HIGH (ref 12.0–88.0)

## 2022-04-13 LAB — VITAMIN D CALCITROL, 1,25 HYDROXYVITAMIN D
VITAMIN D,1,25 (OH)2,TOTAL: <8 pg/mL — ABNORMAL LOW (ref 18–72)
VITAMIN D2, 1,25 (OH)2: <8 pg/mL
VITAMIN D3, 1,25 (OH)2: <8 pg/mL

## 2022-04-15 ENCOUNTER — Other Ambulatory Visit: Payer: Self-pay

## 2022-04-15 ENCOUNTER — Inpatient Hospital Stay
Admission: RE | Admit: 2022-04-15 | Discharge: 2022-04-15 | Disposition: A | Discharge status: Home / Self Care | Payer: Medicare PPO | Source: Ambulatory Visit

## 2022-04-15 DIAGNOSIS — M25511 Pain in right shoulder: Secondary | ICD-10-CM

## 2022-04-17 ENCOUNTER — Encounter (INDEPENDENT_AMBULATORY_CARE_PROVIDER_SITE_OTHER): Payer: Self-pay | Admitting: Nephrology

## 2022-05-06 ENCOUNTER — Other Ambulatory Visit (HOSPITAL_BASED_OUTPATIENT_CLINIC_OR_DEPARTMENT_OTHER): Payer: Medicare PPO | Admitting: NURSE PRACTITIONER

## 2022-05-06 ENCOUNTER — Other Ambulatory Visit: Payer: Medicare PPO | Attending: NURSE PRACTITIONER

## 2022-05-06 DIAGNOSIS — R410 Disorientation, unspecified: Secondary | ICD-10-CM | POA: Insufficient documentation

## 2022-05-07 ENCOUNTER — Other Ambulatory Visit: Payer: Self-pay

## 2022-05-07 ENCOUNTER — Other Ambulatory Visit: Payer: Medicare PPO | Attending: NURSE PRACTITIONER

## 2022-05-07 DIAGNOSIS — R410 Disorientation, unspecified: Secondary | ICD-10-CM | POA: Insufficient documentation

## 2022-05-07 LAB — URINE DRUG SCREEN
AMPHETAMINES URINE: NEGATIVE
BARBITURATES URINE: NEGATIVE
BENZODIAZEPINES URINE: NEGATIVE
CANNABINOIDS URINE: NEGATIVE
COCAINE METABOLITES URINE: NEGATIVE
METHADONE URINE: NEGATIVE
OPIATES URINE: NEGATIVE
PCP URINE: NEGATIVE

## 2022-05-08 ENCOUNTER — Other Ambulatory Visit: Payer: Medicare PPO

## 2022-05-08 DIAGNOSIS — R531 Weakness: Secondary | ICD-10-CM | POA: Insufficient documentation

## 2022-05-08 DIAGNOSIS — R5383 Other fatigue: Secondary | ICD-10-CM | POA: Insufficient documentation

## 2022-05-08 DIAGNOSIS — R410 Disorientation, unspecified: Secondary | ICD-10-CM | POA: Insufficient documentation

## 2022-05-08 LAB — AMMONIA: AMMONIA: 37 umol/L (ref 10–54)

## 2022-05-14 ENCOUNTER — Other Ambulatory Visit: Payer: Self-pay

## 2022-05-14 ENCOUNTER — Other Ambulatory Visit: Payer: Medicare PPO | Attending: NURSE PRACTITIONER

## 2022-05-20 ENCOUNTER — Other Ambulatory Visit: Payer: Self-pay

## 2022-09-22 ENCOUNTER — Other Ambulatory Visit (HOSPITAL_COMMUNITY): Payer: Medicare PPO

## 2022-09-22 ENCOUNTER — Other Ambulatory Visit: Payer: Medicare PPO

## 2022-09-22 ENCOUNTER — Other Ambulatory Visit: Payer: Self-pay

## 2022-09-22 DIAGNOSIS — M868X7 Other osteomyelitis, ankle and foot: Secondary | ICD-10-CM | POA: Insufficient documentation

## 2022-09-22 DIAGNOSIS — S91301A Unspecified open wound, right foot, initial encounter: Secondary | ICD-10-CM

## 2022-09-24 LAB — WOUND, SUPERFICIAL/NON-STERILE SITE, AEROBIC CULTURE AND GRAM STAIN: GRAM STAIN: NONE SEEN

## 2022-09-27 ENCOUNTER — Inpatient Hospital Stay
Admission: RE | Admit: 2022-09-27 | Discharge: 2022-09-27 | Disposition: A | Discharge status: Home / Self Care | Payer: Medicare PPO | Source: Ambulatory Visit | Attending: NURSE PRACTITIONER | Admitting: NURSE PRACTITIONER

## 2022-09-27 ENCOUNTER — Other Ambulatory Visit (HOSPITAL_COMMUNITY): Payer: Self-pay

## 2022-09-27 ENCOUNTER — Other Ambulatory Visit: Payer: Self-pay

## 2022-09-27 ENCOUNTER — Other Ambulatory Visit (HOSPITAL_COMMUNITY): Payer: Self-pay | Admitting: NURSE PRACTITIONER

## 2022-09-27 DIAGNOSIS — M869 Osteomyelitis, unspecified: Secondary | ICD-10-CM | POA: Insufficient documentation

## 2022-10-01 ENCOUNTER — Other Ambulatory Visit: Payer: Self-pay

## 2022-10-01 ENCOUNTER — Emergency Department (HOSPITAL_BASED_OUTPATIENT_CLINIC_OR_DEPARTMENT_OTHER): Payer: Medicare PPO

## 2022-10-01 ENCOUNTER — Encounter (HOSPITAL_BASED_OUTPATIENT_CLINIC_OR_DEPARTMENT_OTHER): Payer: Self-pay

## 2022-10-01 ENCOUNTER — Emergency Department
Admission: EM | Admit: 2022-10-01 | Discharge: 2022-10-01 | Payer: Medicare PPO | Source: Skilled Nursing Facility | Attending: Emergency Medicine | Admitting: Emergency Medicine

## 2022-10-01 DIAGNOSIS — D649 Anemia, unspecified: Secondary | ICD-10-CM | POA: Insufficient documentation

## 2022-10-01 DIAGNOSIS — R9431 Abnormal electrocardiogram [ECG] [EKG]: Secondary | ICD-10-CM | POA: Insufficient documentation

## 2022-10-01 DIAGNOSIS — Z452 Encounter for adjustment and management of vascular access device: Secondary | ICD-10-CM | POA: Insufficient documentation

## 2022-10-01 DIAGNOSIS — J984 Other disorders of lung: Secondary | ICD-10-CM | POA: Insufficient documentation

## 2022-10-01 DIAGNOSIS — K5641 Fecal impaction: Secondary | ICD-10-CM | POA: Insufficient documentation

## 2022-10-01 LAB — CBC WITH DIFF
BASOPHIL #: 0.03 10*3/uL (ref 0.00–0.30)
BASOPHIL %: 0 % (ref 0–3)
EOSINOPHIL #: 0.25 10*3/uL (ref 0.00–0.80)
EOSINOPHIL %: 2 % (ref 0–7)
HCT: 33.7 % — ABNORMAL LOW (ref 42.0–51.0)
HGB: 10.9 g/dL — ABNORMAL LOW (ref 13.5–18.0)
LYMPHOCYTE #: 1.37 10*3/uL (ref 1.10–5.00)
LYMPHOCYTE %: 12 % — ABNORMAL LOW (ref 25–45)
MCH: 28.3 pg (ref 27.0–32.0)
MCHC: 32.4 g/dL (ref 32.0–36.0)
MCV: 87.2 fL (ref 78.0–99.0)
MONOCYTE #: 0.7 10*3/uL (ref 0.00–1.30)
MONOCYTE %: 6 % (ref 0–12)
MPV: 8.8 fL (ref 7.4–10.4)
NEUTROPHIL #: 9.41 10*3/uL — ABNORMAL HIGH (ref 1.80–8.40)
NEUTROPHIL %: 80 % — ABNORMAL HIGH (ref 40–76)
PLATELETS: 267 10*3/uL (ref 140–440)
RBC: 3.86 10*6/uL — ABNORMAL LOW (ref 4.20–6.00)
RDW: 18 % — ABNORMAL HIGH (ref 11.6–14.8)
WBC: 11.8 10*3/uL — ABNORMAL HIGH (ref 4.0–10.5)

## 2022-10-01 LAB — PT/INR
INR: 1.04 (ref 0.88–1.10)
PROTHROMBIN TIME: 12.1 seconds (ref 9.8–12.7)

## 2022-10-01 LAB — COMPREHENSIVE METABOLIC PANEL, NON-FASTING
ALBUMIN/GLOBULIN RATIO: 0.6 — ABNORMAL LOW (ref 0.8–1.4)
ALBUMIN: 2.7 g/dL — ABNORMAL LOW (ref 3.4–5.0)
ALKALINE PHOSPHATASE: 107 U/L (ref 46–116)
ALT (SGPT): 9 U/L (ref <=78)
ANION GAP: 10 mmol/L (ref 4–13)
AST (SGOT): 12 U/L — ABNORMAL LOW (ref 15–37)
BILIRUBIN TOTAL: 0.5 mg/dL (ref 0.2–1.0)
BUN/CREA RATIO: 15
BUN: 48 mg/dL — ABNORMAL HIGH (ref 7–18)
CALCIUM, CORRECTED: 9.5 mg/dL
CALCIUM: 8.5 mg/dL (ref 8.5–10.1)
CHLORIDE: 102 mmol/L (ref 98–107)
CO2 TOTAL: 25 mmol/L (ref 21–32)
CREATININE: 3.24 mg/dL — ABNORMAL HIGH (ref 0.70–1.30)
ESTIMATED GFR: 21 mL/min/{1.73_m2} — ABNORMAL LOW (ref >59)
GLOBULIN: 4.7
GLUCOSE: 243 mg/dL — ABNORMAL HIGH (ref 74–106)
OSMOLALITY, CALCULATED: 295 mOsm/kg — ABNORMAL HIGH (ref 270–290)
POTASSIUM: 3.5 mmol/L (ref 3.5–5.1)
PROTEIN TOTAL: 7.4 g/dL (ref 6.4–8.2)
SODIUM: 137 mmol/L (ref 136–145)

## 2022-10-01 LAB — PTT (PARTIAL THROMBOPLASTIN TIME): APTT: 36.6 seconds — ABNORMAL HIGH (ref 22.4–31.7)

## 2022-10-01 LAB — ECG 12 LEAD
Atrial Rate: 70 {beats}/min
Calculated P Axis: 88 degrees
Calculated R Axis: -51 degrees
Calculated T Axis: 37 degrees
PR Interval: 188 ms
QRS Duration: 112 ms
QT Interval: 462 ms
QTC Calculation: 498 ms
Ventricular rate: 70 {beats}/min

## 2022-10-01 LAB — TYPE AND SCREEN
ABO/RH(D): O POS
ANTIBODY SCREEN: NEGATIVE

## 2022-10-01 LAB — OCCULT BLOOD (PRN ED USE ONLY): OCCULT BLOOD: NEGATIVE

## 2022-10-01 LAB — LIPASE: LIPASE: 28 U/L (ref 11–82)

## 2022-10-01 MED ORDER — SODIUM CHLORIDE 0.9 % IV BOLUS
1000.0000 mL | INJECTION | Status: AC
Start: 2022-10-01 — End: 2022-10-01
  Administered 2022-10-01: 0 mL via INTRAVENOUS
  Administered 2022-10-01: 1000 mL via INTRAVENOUS

## 2022-10-01 NOTE — ED Triage Notes (Addendum)
Hemoglobin 6.5 yesterday but was 12 09/29/22. Patient does not have any chief complaint. From [facility] home. No obvious bleeding per EMS. Had PICC line placed a few days ago right upper arm.

## 2022-10-01 NOTE — ED Provider Notes (Signed)
[facility], [facility] Emergency Department  ED Primary Provider Note  History of Present Illness   Chief Complaint   Patient presents with    Abnormal Lab Result     [name] [name] is a 62 y.o. male who had concerns including Abnormal Lab Result.  Arrival: The patient arrived by Ambulance complaining of why he is here.  Patient states that they did blood work and it was low so they sent him here.  Patient states not feeling any weakness or fatigue.  He denies any shortness of breath or chest pain.  Patient denies any fever chills.  No abdominal pain.  Patient stated that he was told his hemoglobin was 6.5.  He had a PICC line inserted the other day and stated some blood leaked from the PICC line.  He denies any dysuria or increased frequency.    HPI  Review of Systems   Review of Systems   Constitutional:  Negative for chills and fever.   HENT:  Negative for ear pain and sore throat.    Eyes:  Negative for pain and visual disturbance.   Respiratory:  Negative for cough and shortness of breath.    Cardiovascular:  Negative for chest pain and palpitations.   Gastrointestinal:  Negative for abdominal pain and vomiting.   Genitourinary:  Negative for dysuria and hematuria.   Musculoskeletal:  Negative for arthralgias and back pain.   Skin:  Negative for color change and rash.   Neurological:  Negative for seizures and syncope.   All other systems reviewed and are negative.     Historical Data   History Reviewed This Encounter:     Physical Exam   ED Triage Vitals [10/01/22 1027]   BP (Non-Invasive) (!) 145/90   Heart Rate 72   Respiratory Rate 19   Temperature 36.2 C (97.2 F)   SpO2 98 %   Weight    Height      Physical Exam  Vitals and nursing note reviewed.   Constitutional:       General: He is not in acute distress.     Appearance: He is well-developed. He is obese.   HENT:      Head: Normocephalic and atraumatic.      Right Ear: External ear normal.      Left Ear: External ear  normal.      Nose: Nose normal.      Mouth/Throat:      Mouth: Mucous membranes are moist.      Pharynx: Oropharynx is clear.   Eyes:      Extraocular Movements: Extraocular movements intact.      Conjunctiva/sclera: Conjunctivae normal.      Pupils: Pupils are equal, round, and reactive to light.      Comments: No pallor noted in the conjunctiva   Cardiovascular:      Rate and Rhythm: Normal rate and regular rhythm.      Pulses: Normal pulses.      Heart sounds: Normal heart sounds. No murmur heard.  Pulmonary:      Effort: Pulmonary effort is normal. No respiratory distress.      Breath sounds: Normal breath sounds.   Abdominal:      General: Bowel sounds are normal.      Palpations: Abdomen is soft.      Tenderness: There is no abdominal tenderness.   Musculoskeletal:         General: No swelling. Normal range of motion.  Cervical back: Normal range of motion and neck supple.   Skin:     General: Skin is warm and dry.      Capillary Refill: Capillary refill takes less than 2 seconds.   Neurological:      General: No focal deficit present.      Mental Status: He is alert and oriented to person, place, and time.   Psychiatric:         Mood and Affect: Mood normal.         Behavior: Behavior normal.         Thought Content: Thought content normal.       Patient Data     Labs Ordered/Reviewed   COMPREHENSIVE METABOLIC PANEL, NON-FASTING - Abnormal; Notable for the following components:       Result Value    BUN 48 (*)     CREATININE 3.24 (*)     ESTIMATED GFR 21 (*)     ALBUMIN 2.7 (*)     GLUCOSE 243 (*)     AST (SGOT) 12 (*)     ALBUMIN/GLOBULIN RATIO 0.6 (*)     OSMOLALITY, CALCULATED 295 (*)     All other components within normal limits    Narrative:     Estimated Glomerular Filtration Rate (eGFR) is calculated using the CKD-EPI (2021) equation, intended for patients 52 years of age and older. If gender is not documented or "unknown", there will be no eGFR calculation.   PTT (PARTIAL THROMBOPLASTIN TIME) -  Abnormal; Notable for the following components:    APTT 36.6 (*)     All other components within normal limits   CBC WITH DIFF - Abnormal; Notable for the following components:    WBC 11.8 (*)     RBC 3.86 (*)     HGB 10.9 (*)     HCT 33.7 (*)     RDW 18.0 (*)     NEUTROPHIL % 80 (*)     LYMPHOCYTE % 12 (*)     NEUTROPHIL # 9.41 (*)     All other components within normal limits   OCCULT BLOOD (PRN ED USE ONLY) - Normal   LIPASE - Normal   PT/INR - Normal    Narrative:     INR OF 2.0-3.0  RECOMMENDED FOR: PROPHYLAXIS/TREATMENT OF VENEOUS THROMBOSIS, PULMONARY EMBOLISM, PREVENTION OF SYSTEMIC EMBOLISM FROM ATRIAL FIBRILATION, MYOCARDIAL INFARCTION.    INR OF 2.5-3.5  RECOMMENDED FOR MECHANICAL PROSTHETIC HEART VALVES, RECURRENT SYSTEMIC EMBOLISM, RECURRENT MYOCARDIAL INFARCTION.     CBC/DIFF    Narrative:     The following orders were created for panel order CBC/DIFF.  Procedure                               Abnormality         Status                     ---------                               -----------         ------                     CBC WITH PQZR[[id number]]                Abnormal  Final result                 Please view results for these tests on the individual orders.   TYPE AND SCREEN     CT ABDOMEN PELVIS WO IV CONTRAST   Final Result by Edi, Radresults In (01/23 1221)   Probable constipation with rectal fecal impaction.      Otherwise unremarkable CT abdomen and pelvis without contrast.          One or more dose reduction techniques were used (e.g., Automated exposure control, adjustment of the mA and/or kV according to patient size, use of iterative reconstruction technique).         Radiologist location ID: [id number] Decision Making        Medical Decision Making  Patient is 62 year old white male who was sent for a hemoglobin of 6.5,2 days ago..  Patient denies any generalized weakness or fatigue.  No chest pain or shortness of breath.  No nausea vomiting or diarrhea.   Patient will have repeat H&H drawn.  Patient denies any bleeding other than when they put the PICC line in.  Patient denied any black or bloody stools.  No abdominal pain.  Repeat H&H here in the ER is 10.9.  Patient does not appear to have any pallor at this time.  Patient wants to go back to the nursing home.  Patient will be discharged back to [facility] and followed by his family physician there.    Amount and/or Complexity of Data Reviewed  Labs: ordered.  Radiology: ordered.  ECG/medicine tests: ordered.     Details: Normal sinus rhythm 70, PR interval 188 MS, QT interval 462 MS, left axis deviation, prolonged QT interval, flat T-waves 3, AVF, V4 through V6.  Poor R-wave progression through the precordium.             Medications Administered in the ED   NS bolus infusion 1,000 mL (0 mL Intravenous Stopped 10/01/22 1325)     Clinical Impression   Anemia, unspecified type (Primary)       Disposition: Discharged               Clinical Impression   Anemia, unspecified type (Primary)       Discharge Medication List as of 10/01/2022  1:21 PM

## 2022-10-01 NOTE — ED Notes (Signed)
[facility] contacted to transport patient back to [facility] at this time

## 2022-10-01 NOTE — ED Nurses Note (Signed)
Report called Elsa @ [facility].  Patient transported via [employer].

## 2022-10-05 ENCOUNTER — Other Ambulatory Visit: Payer: Medicare PPO

## 2022-10-05 ENCOUNTER — Other Ambulatory Visit: Payer: Self-pay

## 2022-10-05 DIAGNOSIS — S91301A Unspecified open wound, right foot, initial encounter: Secondary | ICD-10-CM | POA: Insufficient documentation

## 2022-10-05 DIAGNOSIS — M868X7 Other osteomyelitis, ankle and foot: Secondary | ICD-10-CM | POA: Insufficient documentation

## 2022-10-05 DIAGNOSIS — Z792 Long term (current) use of antibiotics: Secondary | ICD-10-CM | POA: Insufficient documentation

## 2022-10-05 LAB — VANCOMYCIN, TROUGH: VANCOMYCIN TROUGH: 12.1 ug/mL — ABNORMAL HIGH (ref 5.0–10.0)

## 2022-10-15 ENCOUNTER — Other Ambulatory Visit: Payer: Self-pay

## 2022-10-15 ENCOUNTER — Other Ambulatory Visit: Payer: Medicare PPO

## 2022-10-15 ENCOUNTER — Other Ambulatory Visit (HOSPITAL_COMMUNITY): Payer: Medicare PPO

## 2022-10-15 DIAGNOSIS — M869 Osteomyelitis, unspecified: Secondary | ICD-10-CM

## 2022-10-15 DIAGNOSIS — M659 Synovitis and tenosynovitis, unspecified: Secondary | ICD-10-CM | POA: Insufficient documentation

## 2022-10-15 DIAGNOSIS — N183 Chronic kidney disease, stage 3 unspecified (CMS HCC): Secondary | ICD-10-CM

## 2022-10-15 DIAGNOSIS — M8580 Other specified disorders of bone density and structure, unspecified site: Secondary | ICD-10-CM | POA: Insufficient documentation

## 2022-10-15 DIAGNOSIS — L403 Pustulosis palmaris et plantaris: Secondary | ICD-10-CM

## 2022-10-15 DIAGNOSIS — L709 Acne, unspecified: Secondary | ICD-10-CM | POA: Insufficient documentation

## 2022-10-15 LAB — BUN: BUN: 48 mg/dL — ABNORMAL HIGH (ref 7–25)

## 2022-10-15 LAB — CREATININE WITH EGFR
CREATININE: 3.46 mg/dL — ABNORMAL HIGH (ref 0.60–1.30)
ESTIMATED GFR: 19 mL/min/{1.73_m2} — ABNORMAL LOW (ref >59)

## 2022-10-15 LAB — VANCOMYCIN, TROUGH: VANCOMYCIN TROUGH: 21.6 ug/mL (ref 5.0–10.0)

## 2022-10-15 NOTE — ED Nurses Note (Signed)
Vancomycin trough critical received from larry at [facility] lab, faxed to [facility]

## 2022-10-21 ENCOUNTER — Other Ambulatory Visit: Payer: Self-pay

## 2022-10-21 ENCOUNTER — Other Ambulatory Visit: Payer: Medicare PPO

## 2022-10-21 ENCOUNTER — Other Ambulatory Visit (HOSPITAL_COMMUNITY): Payer: Medicare PPO

## 2022-10-21 DIAGNOSIS — H05029 Osteomyelitis of unspecified orbit: Secondary | ICD-10-CM | POA: Insufficient documentation

## 2022-10-21 DIAGNOSIS — Z79899 Other long term (current) drug therapy: Secondary | ICD-10-CM

## 2022-10-22 LAB — CREATININE WITH EGFR
CREATININE: 3.53 mg/dL — ABNORMAL HIGH (ref 0.60–1.30)
ESTIMATED GFR: 19 mL/min/{1.73_m2} — ABNORMAL LOW (ref >59)

## 2022-10-22 LAB — VANCOMYCIN, TROUGH: VANCOMYCIN TROUGH: 16.4 ug/mL — ABNORMAL HIGH (ref 5.0–10.0)

## 2022-10-22 LAB — BUN: BUN: 47 mg/dL — ABNORMAL HIGH (ref 7–25)

## 2022-11-07 ENCOUNTER — Other Ambulatory Visit (HOSPITAL_BASED_OUTPATIENT_CLINIC_OR_DEPARTMENT_OTHER): Payer: Medicare PPO

## 2022-11-07 ENCOUNTER — Other Ambulatory Visit: Payer: Medicare PPO

## 2022-11-07 ENCOUNTER — Other Ambulatory Visit: Payer: Self-pay

## 2022-11-07 ENCOUNTER — Encounter (HOSPITAL_BASED_OUTPATIENT_CLINIC_OR_DEPARTMENT_OTHER): Payer: Self-pay

## 2022-11-07 DIAGNOSIS — N183 Chronic kidney disease, stage 3 unspecified: Secondary | ICD-10-CM | POA: Insufficient documentation

## 2022-11-07 DIAGNOSIS — Z79899 Other long term (current) drug therapy: Secondary | ICD-10-CM | POA: Insufficient documentation

## 2022-11-07 DIAGNOSIS — Z5181 Encounter for therapeutic drug level monitoring: Secondary | ICD-10-CM | POA: Insufficient documentation

## 2022-11-07 LAB — DRUG SCREEN, WITH CONFIRMATION, URINE
AMPHET QL: NEGATIVE
BARB QL: NEGATIVE
BENZO QL: NEGATIVE
BUP QL: POSITIVE — AB
CANNAQL: NEGATIVE
COCQL: NEGATIVE
FENTANYL, RANDOM URINE: NEGATIVE
METHQL: NEGATIVE
OPIATE: NEGATIVE
OXYCODONE URINE: NEGATIVE
PCP QL: NEGATIVE

## 2022-11-10 LAB — NOTES AND COMMENTS

## 2022-11-10 LAB — DRUG MONITORING, GABAPENTIN, QUANTITATIVE, URINE: GABAPENTIN: NEGATIVE ng/mL (ref <1000)

## 2022-11-11 LAB — SUBOXONE CONFIRMATORY/DEFINITIVE, URINE, BY LC-MS/MS (PERFORMABLE)
BUPRENORPHINE: 26 ng/mL — ABNORMAL HIGH (ref <10)
NALOXONE: 131 ng/mL — ABNORMAL HIGH (ref <5)
NORBUPRENORPHINE: 589 ng/mL — ABNORMAL HIGH (ref <10)

## 2022-11-13 ENCOUNTER — Other Ambulatory Visit: Payer: Medicare PPO

## 2022-11-13 ENCOUNTER — Other Ambulatory Visit: Payer: Self-pay

## 2022-11-13 DIAGNOSIS — Z0289 Encounter for other administrative examinations: Secondary | ICD-10-CM | POA: Insufficient documentation

## 2022-11-13 DIAGNOSIS — F1021 Alcohol dependence, in remission: Secondary | ICD-10-CM

## 2022-11-13 LAB — DRUG SCREEN, WITH CONFIRMATION, URINE
AMPHET QL: NEGATIVE
BARB QL: NEGATIVE
BENZO QL: NEGATIVE
BUP QL: POSITIVE — AB
CANNAQL: NEGATIVE
COCQL: NEGATIVE
FENTANYL, RANDOM URINE: NEGATIVE
METHQL: NEGATIVE
OPIATE: NEGATIVE
OXYCODONE URINE: NEGATIVE
PCP QL: NEGATIVE

## 2022-11-18 LAB — SUBOXONE CONFIRMATORY/DEFINITIVE, URINE, BY LC-MS/MS (PERFORMABLE)
BUPRENORPHINE: 24 ng/mL — ABNORMAL HIGH (ref <10)
NALOXONE: 96 ng/mL — ABNORMAL HIGH (ref <5)
NORBUPRENORPHINE: 336 ng/mL — ABNORMAL HIGH (ref <10)

## 2022-12-17 ENCOUNTER — Other Ambulatory Visit (INDEPENDENT_AMBULATORY_CARE_PROVIDER_SITE_OTHER): Payer: MEDICARE

## 2022-12-18 ENCOUNTER — Other Ambulatory Visit: Payer: Self-pay

## 2022-12-18 ENCOUNTER — Other Ambulatory Visit: Payer: Medicare PPO | Attending: Nephrology

## 2022-12-18 ENCOUNTER — Other Ambulatory Visit (INDEPENDENT_AMBULATORY_CARE_PROVIDER_SITE_OTHER): Payer: Self-pay | Admitting: Nephrology

## 2022-12-18 ENCOUNTER — Other Ambulatory Visit (INDEPENDENT_AMBULATORY_CARE_PROVIDER_SITE_OTHER): Payer: MEDICARE

## 2022-12-18 DIAGNOSIS — N184 Chronic kidney disease, stage 4 (severe): Secondary | ICD-10-CM | POA: Insufficient documentation

## 2022-12-18 DIAGNOSIS — E559 Vitamin D deficiency, unspecified: Secondary | ICD-10-CM

## 2022-12-18 LAB — MICROALBUMIN/CREATININE RATIO, URINE, RANDOM
CREATININE RANDOM URINE: 94 mg/dL (ref 30–125)
MICROALBUMIN RANDOM URINE: 205 mg/dL
MICROALBUMIN/CREATININE RATIO RANDOM URINE: 2180.9 mg/g

## 2022-12-18 LAB — URIC ACID: URIC ACID: 6 mg/dL (ref 2.3–7.6)

## 2022-12-20 ENCOUNTER — Other Ambulatory Visit (INDEPENDENT_AMBULATORY_CARE_PROVIDER_SITE_OTHER): Payer: Self-pay

## 2022-12-24 ENCOUNTER — Encounter (INDEPENDENT_AMBULATORY_CARE_PROVIDER_SITE_OTHER): Payer: MEDICARE | Admitting: Nephrology

## 2022-12-25 ENCOUNTER — Encounter (INDEPENDENT_AMBULATORY_CARE_PROVIDER_SITE_OTHER): Payer: MEDICARE | Admitting: Nephrology

## 2022-12-27 ENCOUNTER — Encounter (INDEPENDENT_AMBULATORY_CARE_PROVIDER_SITE_OTHER): Payer: Self-pay | Admitting: Nephrology

## 2023-01-03 ENCOUNTER — Other Ambulatory Visit: Payer: Medicare PPO

## 2023-01-03 ENCOUNTER — Other Ambulatory Visit: Payer: Self-pay

## 2023-01-03 ENCOUNTER — Other Ambulatory Visit (HOSPITAL_COMMUNITY): Payer: Medicare PPO

## 2023-01-03 DIAGNOSIS — G894 Chronic pain syndrome: Secondary | ICD-10-CM

## 2023-01-04 LAB — BUPRENORPHINE (SUBOXONE), RANDOM URINE: BUPRENORPHINE URINE: POSITIVE — AB

## 2023-01-24 ENCOUNTER — Encounter (INDEPENDENT_AMBULATORY_CARE_PROVIDER_SITE_OTHER): Payer: MEDICARE | Admitting: Nephrology

## 2023-01-24 ENCOUNTER — Encounter (INDEPENDENT_AMBULATORY_CARE_PROVIDER_SITE_OTHER): Payer: Self-pay

## 2023-03-03 ENCOUNTER — Ambulatory Visit: Payer: Medicare PPO | Admitting: ORTHOPEDIC, SPORTS MEDICINE

## 2023-03-12 ENCOUNTER — Emergency Department (HOSPITAL_BASED_OUTPATIENT_CLINIC_OR_DEPARTMENT_OTHER): Payer: Medicare PPO

## 2023-03-12 ENCOUNTER — Inpatient Hospital Stay
Admission: EM | Admit: 2023-03-12 | Discharge: 2023-03-21 | DRG: 682 | Disposition: A | Discharge status: Skilled Nursing Facility | Payer: Medicare PPO | Attending: Internal Medicine | Admitting: Internal Medicine

## 2023-03-12 ENCOUNTER — Encounter (HOSPITAL_BASED_OUTPATIENT_CLINIC_OR_DEPARTMENT_OTHER): Payer: Self-pay

## 2023-03-12 ENCOUNTER — Other Ambulatory Visit: Payer: Self-pay

## 2023-03-12 DIAGNOSIS — W19XXXA Unspecified fall, initial encounter: Secondary | ICD-10-CM | POA: Diagnosis present

## 2023-03-12 DIAGNOSIS — Z7984 Long term (current) use of oral hypoglycemic drugs: Secondary | ICD-10-CM

## 2023-03-12 DIAGNOSIS — I639 Cerebral infarction, unspecified: Secondary | ICD-10-CM

## 2023-03-12 DIAGNOSIS — N179 Acute kidney failure, unspecified: Principal | ICD-10-CM | POA: Diagnosis present

## 2023-03-12 DIAGNOSIS — Z7409 Other reduced mobility: Secondary | ICD-10-CM | POA: Diagnosis present

## 2023-03-12 DIAGNOSIS — R4182 Altered mental status, unspecified: Principal | ICD-10-CM

## 2023-03-12 DIAGNOSIS — A0472 Enterocolitis due to Clostridium difficile, not specified as recurrent: Secondary | ICD-10-CM | POA: Diagnosis present

## 2023-03-12 DIAGNOSIS — W06XXXA Fall from bed, initial encounter: Secondary | ICD-10-CM

## 2023-03-12 DIAGNOSIS — Z7982 Long term (current) use of aspirin: Secondary | ICD-10-CM

## 2023-03-12 DIAGNOSIS — E11649 Type 2 diabetes mellitus with hypoglycemia without coma: Secondary | ICD-10-CM | POA: Diagnosis not present

## 2023-03-12 DIAGNOSIS — R440 Auditory hallucinations: Secondary | ICD-10-CM | POA: Diagnosis present

## 2023-03-12 DIAGNOSIS — G9341 Metabolic encephalopathy: Secondary | ICD-10-CM | POA: Diagnosis present

## 2023-03-12 DIAGNOSIS — Z515 Encounter for palliative care: Secondary | ICD-10-CM

## 2023-03-12 DIAGNOSIS — N2581 Secondary hyperparathyroidism of renal origin: Secondary | ICD-10-CM | POA: Diagnosis present

## 2023-03-12 DIAGNOSIS — F1120 Opioid dependence, uncomplicated: Secondary | ICD-10-CM | POA: Diagnosis present

## 2023-03-12 DIAGNOSIS — Y92129 Unspecified place in nursing home as the place of occurrence of the external cause: Secondary | ICD-10-CM

## 2023-03-12 DIAGNOSIS — R4789 Other speech disturbances: Secondary | ICD-10-CM

## 2023-03-12 DIAGNOSIS — N184 Chronic kidney disease, stage 4 (severe): Secondary | ICD-10-CM | POA: Diagnosis present

## 2023-03-12 DIAGNOSIS — E1165 Type 2 diabetes mellitus with hyperglycemia: Secondary | ICD-10-CM | POA: Diagnosis not present

## 2023-03-12 DIAGNOSIS — Z87891 Personal history of nicotine dependence: Secondary | ICD-10-CM

## 2023-03-12 DIAGNOSIS — Z794 Long term (current) use of insulin: Secondary | ICD-10-CM

## 2023-03-12 DIAGNOSIS — K219 Gastro-esophageal reflux disease without esophagitis: Secondary | ICD-10-CM | POA: Diagnosis present

## 2023-03-12 DIAGNOSIS — E1142 Type 2 diabetes mellitus with diabetic polyneuropathy: Secondary | ICD-10-CM | POA: Diagnosis present

## 2023-03-12 DIAGNOSIS — T783XXA Angioneurotic edema, initial encounter: Secondary | ICD-10-CM | POA: Diagnosis present

## 2023-03-12 DIAGNOSIS — D72829 Elevated white blood cell count, unspecified: Secondary | ICD-10-CM

## 2023-03-12 DIAGNOSIS — E872 Acidosis, unspecified: Secondary | ICD-10-CM | POA: Diagnosis present

## 2023-03-12 DIAGNOSIS — G8929 Other chronic pain: Secondary | ICD-10-CM | POA: Diagnosis present

## 2023-03-12 DIAGNOSIS — T380X5A Adverse effect of glucocorticoids and synthetic analogues, initial encounter: Secondary | ICD-10-CM | POA: Diagnosis present

## 2023-03-12 DIAGNOSIS — E1122 Type 2 diabetes mellitus with diabetic chronic kidney disease: Secondary | ICD-10-CM | POA: Diagnosis present

## 2023-03-12 DIAGNOSIS — R441 Visual hallucinations: Secondary | ICD-10-CM | POA: Diagnosis present

## 2023-03-12 DIAGNOSIS — S80212A Abrasion, left knee, initial encounter: Secondary | ICD-10-CM | POA: Diagnosis present

## 2023-03-12 DIAGNOSIS — N401 Enlarged prostate with lower urinary tract symptoms: Secondary | ICD-10-CM | POA: Diagnosis present

## 2023-03-12 DIAGNOSIS — S91302A Unspecified open wound, left foot, initial encounter: Secondary | ICD-10-CM

## 2023-03-12 DIAGNOSIS — Z79899 Other long term (current) drug therapy: Secondary | ICD-10-CM

## 2023-03-12 DIAGNOSIS — N138 Other obstructive and reflux uropathy: Secondary | ICD-10-CM | POA: Diagnosis present

## 2023-03-12 LAB — CBC WITH DIFF
BASOPHIL #: 0.02 10*3/uL (ref 0.00–0.30)
BASOPHIL %: 0 % (ref 0–3)
EOSINOPHIL #: 0.13 10*3/uL (ref 0.00–0.80)
EOSINOPHIL %: 1 % (ref 0–7)
HCT: 37.2 % — ABNORMAL LOW (ref 42.0–51.0)
HGB: 11.8 g/dL — ABNORMAL LOW (ref 13.5–18.0)
LYMPHOCYTE #: 1.08 10*3/uL — ABNORMAL LOW (ref 1.10–5.00)
LYMPHOCYTE %: 6 % — ABNORMAL LOW (ref 25–45)
MCH: 27.5 pg (ref 27.0–32.0)
MCHC: 31.8 g/dL — ABNORMAL LOW (ref 32.0–36.0)
MCV: 86.2 fL (ref 78.0–99.0)
MONOCYTE #: 0.91 10*3/uL (ref 0.00–1.30)
MONOCYTE %: 5 % (ref 0–12)
MPV: 8.1 fL (ref 7.4–10.4)
NEUTROPHIL #: 16.56 10*3/uL — ABNORMAL HIGH (ref 1.80–8.40)
NEUTROPHIL %: 89 % — ABNORMAL HIGH (ref 40–76)
PLATELETS: 349 10*3/uL (ref 140–440)
RBC: 4.31 10*6/uL (ref 4.20–6.00)
RDW: 18.1 % — ABNORMAL HIGH (ref 11.6–14.8)
WBC: 18.7 10*3/uL — ABNORMAL HIGH (ref 4.0–10.5)

## 2023-03-12 LAB — COMPREHENSIVE METABOLIC PANEL, NON-FASTING
ALBUMIN/GLOBULIN RATIO: 0.4 — ABNORMAL LOW (ref 0.8–1.4)
ALBUMIN: 2.3 g/dL — ABNORMAL LOW (ref 3.4–5.0)
ALKALINE PHOSPHATASE: 86 U/L (ref 46–116)
ALT (SGPT): 12 U/L (ref <=78)
ANION GAP: 14 mmol/L — ABNORMAL HIGH (ref 4–13)
AST (SGOT): 13 U/L — ABNORMAL LOW (ref 15–37)
BILIRUBIN TOTAL: 0.5 mg/dL (ref 0.2–1.0)
BUN/CREA RATIO: 9
BUN: 41 mg/dL — ABNORMAL HIGH (ref 7–18)
CALCIUM, CORRECTED: 9.8 mg/dL
CALCIUM: 8.4 mg/dL — ABNORMAL LOW (ref 8.5–10.1)
CHLORIDE: 103 mmol/L (ref 98–107)
CO2 TOTAL: 22 mmol/L (ref 21–32)
CREATININE: 4.78 mg/dL — ABNORMAL HIGH (ref 0.70–1.30)
ESTIMATED GFR: 13 mL/min/{1.73_m2} — ABNORMAL LOW (ref >59)
GLOBULIN: 5.3
GLUCOSE: 90 mg/dL (ref 74–106)
OSMOLALITY, CALCULATED: 287 mOsm/kg (ref 270–290)
POTASSIUM: 3.2 mmol/L — ABNORMAL LOW (ref 3.5–5.1)
PROTEIN TOTAL: 7.6 g/dL (ref 6.4–8.2)
SODIUM: 139 mmol/L (ref 136–145)

## 2023-03-12 LAB — URINALYSIS, MACRO/MICRO
BILIRUBIN: NEGATIVE mg/dL
GLUCOSE: 500 mg/dL — AB
LEUKOCYTES: NEGATIVE WBCs/uL
NITRITE: NEGATIVE
PH: 6 (ref 4.6–8.0)
PROTEIN: >=300 mg/dL — AB
SPECIFIC GRAVITY: 1.025 (ref 1.003–1.035)
UROBILINOGEN: 0.2 mg/dL (ref 0.2–1.0)

## 2023-03-12 LAB — DRUG SCREEN, NO CONFIRMATION, URINE
AMPHETAMINES URINE: NEGATIVE
BARBITURATES URINE: NEGATIVE
BENZODIAZEPINES URINE: NEGATIVE
CANNABINOIDS URINE: NEGATIVE
COCAINE METABOLITES URINE: NEGATIVE
METHADONE URINE: NEGATIVE
OPIATES URINE: NEGATIVE
PCP URINE: NEGATIVE

## 2023-03-12 LAB — URINALYSIS, MICROSCOPIC

## 2023-03-12 LAB — CREATINE KINASE (CK), TOTAL, SERUM OR PLASMA: CREATINE KINASE: 92 U/L (ref 39–308)

## 2023-03-12 LAB — AMMONIA: AMMONIA: <10 umol/L — ABNORMAL LOW (ref 11–32)

## 2023-03-12 LAB — GOLD TOP TUBE

## 2023-03-12 LAB — GRAY TOP TUBE

## 2023-03-12 MED ORDER — SODIUM CHLORIDE 0.9 % IV BOLUS
1000.0000 mL | INJECTION | Status: AC
Start: 2023-03-12 — End: 2023-03-12
  Administered 2023-03-12: 0 mL via INTRAVENOUS
  Administered 2023-03-12: 1000 mL via INTRAVENOUS

## 2023-03-12 NOTE — ED Nurses Note (Signed)
Sandwich tray given per provider, pt tolerating well, no difficulty swallowing noted

## 2023-03-12 NOTE — ED Nurses Note (Signed)
Report called to [facility] @ [facility]

## 2023-03-12 NOTE — ED Nurses Note (Signed)
Pt son  Madelaine Bhat  [phone] notified of pt transport to [facility]  at this time

## 2023-03-12 NOTE — ED Triage Notes (Signed)
Ems reports pt from [facility] nursing [facility] with reports of fall at 0400. Pt c/o pain left knee, unable to bear weight and rates pain 6/10. Pt also c/o bilat hip pain. Pt told ems pt rolled out of bed and hit knee on the air conditioner. Nursing home called report that pt also has altered mental status and they found "3 strips of Suboxone in his bed that was not his"

## 2023-03-12 NOTE — ED Nurses Note (Signed)
Patient's son, Madelaine Bhat, told this RN that he believes the patient has had a stroke, that he has facial droop and is slurring his speech. Upon assessment, no facial droop, slurred speech, or dysphasia observed. Dr. Ermalinda Memos made aware at this time.

## 2023-03-12 NOTE — ED Nurses Note (Signed)
[facility] here for transport to [facility]

## 2023-03-12 NOTE — ED Provider Notes (Signed)
[facility], [facility] Emergency Department  ED Primary Provider Note  History of Present Illness   Chief Complaint   Patient presents with    [name] [name] is a 62 y.o. male who had concerns including Fall.  Arrival: The patient arrived by Ambulance complaining of falling at a bed last night around 4:00 a.m. hitting his left knee on air conditioning unit.  He denies hitting his head.  He denies any neck or lower back pain.  Patient was found altered by the nursing staff with 3 Suboxone strips in his bed.  Patient is on Suboxone for chronic pain.  Patient denies any hip pain.  He denies any hitting of the head.  He denies any loss of consciousness.  Patient fell year and a half ago at [facility] nursing home and fractured his hip.  He states now he is at [facility] and fell last night.  He denies any numbness or tingling going down the legs.  He denies any severe headache.    HPI  Review of Systems   Review of Systems   Constitutional:  Positive for activity change and appetite change. Negative for chills and fever.   HENT:  Negative for ear pain and sore throat.    Eyes:  Negative for pain and visual disturbance.   Respiratory:  Negative for cough and shortness of breath.    Cardiovascular:  Negative for chest pain and palpitations.   Gastrointestinal:  Negative for abdominal pain and vomiting.   Genitourinary:  Negative for dysuria and hematuria.   Musculoskeletal:  Positive for arthralgias, gait problem and myalgias. Negative for back pain.   Skin:  Negative for color change and rash.   Neurological:  Negative for seizures and syncope.   All other systems reviewed and are negative.     Historical Data   History Reviewed This Encounter: Medical History  Surgical History  Family History  Social History    Physical Exam   ED Triage Vitals   BP (Non-Invasive) 03/12/23 1244 (!) 140/87   Heart Rate 03/12/23 1244 95   Respiratory Rate 03/12/23 1244 20   Temperature 03/12/23 1244 37.4  C (99.3 F)   SpO2 03/12/23 1244 97 %   Weight 03/13/23 0235 95.7 kg (211 lb)   Height 03/13/23 0235 1.803 m (5\' 11" )     Physical Exam  Vitals and nursing note reviewed.   Constitutional:       General: He is not in acute distress.     Appearance: He is well-developed. He is obese.   HENT:      Head: Normocephalic and atraumatic.      Right Ear: External ear normal.      Left Ear: External ear normal.      Nose: Nose normal.      Mouth/Throat:      Mouth: Mucous membranes are dry.   Eyes:      Extraocular Movements: Extraocular movements intact.      Conjunctiva/sclera: Conjunctivae normal.      Pupils: Pupils are equal, round, and reactive to light.   Cardiovascular:      Rate and Rhythm: Normal rate and regular rhythm.      Pulses: Normal pulses.      Heart sounds: Normal heart sounds. No murmur heard.  Pulmonary:      Effort: Pulmonary effort is normal. No respiratory distress.      Breath sounds: Normal breath sounds.   Abdominal:  General: Bowel sounds are normal.      Palpations: Abdomen is soft.      Tenderness: There is no abdominal tenderness.   Musculoskeletal:         General: Swelling and tenderness present.      Cervical back: Normal range of motion and neck supple.      Comments: Positive tenderness over the anterior aspect of the left knee with abrasions to the anterior surface.  Minimal swelling.  No ecchymosis.  No crepitus or deformity.   Skin:     General: Skin is warm and dry.      Capillary Refill: Capillary refill takes less than 2 seconds.   Neurological:      General: No focal deficit present.      Mental Status: He is alert and oriented to person, place, and time.   Psychiatric:         Mood and Affect: Mood normal.         Behavior: Behavior normal.         Thought Content: Thought content normal.       Patient Data     Labs Ordered/Reviewed   COMPREHENSIVE METABOLIC PANEL, NON-FASTING - Abnormal; Notable for the following components:       Result Value    POTASSIUM 3.2 (*)     ANION  GAP 14 (*)     BUN 41 (*)     CREATININE 4.78 (*)     ESTIMATED GFR 13 (*)     ALBUMIN 2.3 (*)     CALCIUM 8.4 (*)     AST (SGOT) 13 (*)     ALBUMIN/GLOBULIN RATIO 0.4 (*)     All other components within normal limits    Narrative:     Estimated Glomerular Filtration Rate (eGFR) is calculated using the CKD-EPI (2021) equation, intended for patients 53 years of age and older. If gender is not documented or "unknown", there will be no eGFR calculation.   CBC WITH DIFF - Abnormal; Notable for the following components:    WBC 18.7 (*)     HGB 11.8 (*)     HCT 37.2 (*)     MCHC 31.8 (*)     RDW 18.1 (*)     NEUTROPHIL % 89 (*)     LYMPHOCYTE % 6 (*)     NEUTROPHIL # 16.56 (*)     LYMPHOCYTE # 1.08 (*)     All other components within normal limits   URINALYSIS, MACRO/MICRO - Abnormal; Notable for the following components:    COLOR Light Yellow (*)     APPEARANCE Slightly Hazy (*)     PROTEIN >= 300 (*)     GLUCOSE 500 (*)     KETONES Trace (*)     BLOOD Moderate (*)     All other components within normal limits   URINALYSIS, MICROSCOPIC - Abnormal; Notable for the following components:    RBCS 11-15 (*)     BACTERIA Few (*)     MUCOUS Few (*)     All other components within normal limits   AMMONIA - Abnormal; Notable for the following components:    AMMONIA <10 (*)     All other components within normal limits    Narrative:     Above range, check for dilution.   DRUG SCREEN, NO CONFIRMATION, URINE - Normal   CREATINE KINASE (CK), TOTAL, SERUM OR PLASMA - Normal   CBC/DIFF    Narrative:  The following orders were created for panel order CBC/DIFF.  Procedure                               Abnormality         Status                     ---------                               -----------         ------                     CBC WITH DIFF[[id number]]                Abnormal            Final result                 Please view results for these tests on the individual orders.   URINALYSIS WITH REFLEX MICROSCOPIC AND CULTURE IF  POSITIVE    Narrative:     The following orders were created for panel order URINALYSIS WITH REFLEX MICROSCOPIC AND CULTURE IF POSITIVE.  Procedure                               Abnormality         Status                     ---------                               -----------         ------                     URINALYSIS, MACRO/MICRO[[id number]]      Abnormal            Final result               URINALYSIS, MICROSCOPIC[[id number]]      Abnormal            Final result                 Please view results for these tests on the individual orders.   EXTRA TUBES    Narrative:     The following orders were created for panel order EXTRA TUBES.  Procedure                               Abnormality         Status                     ---------                               -----------         ------                     GOLD TOP UJWJ[[id number]]  Final result               GRAY TOP [id number]                                    Final result                 Please view results for these tests on the individual orders.   GOLD TOP TUBE   GRAY TOP TUBE     XR CHEST PA AND LATERAL   Final Result by Edi, Radresults In (07/03 1511)   NO ACUTE FINDINGS.           Radiologist location ID: [id number]         XR KNEE LEFT 4 OR MORE VIEWS   Final Result by Edi, Radresults In (07/03 1349)   DEGENERATIVE OSTEOARTHROSIS. NO ACUTE FINDINGS.                Radiologist location ID: [id number]         CT BRAIN WO IV CONTRAST   Final Result by Edi, Radresults In (07/03 1333)   CHRONIC CHANGES.  NO ACUTE FINDINGS.          One or more dose reduction techniques were used (e.g., Automated exposure control, adjustment of the mA and/or kV according to patient size, use of iterative reconstruction technique).         Radiologist location ID: [id number]           Medical Decision Making        Medical Decision Making  Patient is 62 year old white male complaining falling out of bed around 4:00 a.m. last night at  [facility] nursing hitting his left knee on air conditioning unit.  He states having a lot of pain in his knee presently in his unable to weight bear.  Patient does have an infection in his left foot heel in his presently on antibiotics.  Patient states his hips or sore from getting IV injections with antibiotic.  He denies any pain in the hips from falling.  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  He has not moved his right shoulder in several months.  He is complaining of left knee pain only.  Patient will have a knee x-ray and then be treated for results.  He will more than likely go back to the nursing home after blood work and a UDS.  Patient will also have a CT scan because staff said that he was altered and that is unclear whether he truly hit his head or not.  Once everything comes back will decide whether he can go back to the nursing home.      I spoke to Dr. Leonette Most through text and he stated he would have a bed later this evening.  Angie, supervisor stated that a bed will become available for him later this evening.      The son came to see the patient and states that his mumbling speech and his altered mental status is new.  He states normally he can talk normal and he has just been in the room for about an hour and he is already talking about the past history and things that he has not had to deal with in years.  He was talking about his wife who he hated and according to the son would never  talk about.  He is saying bizarre things in his reverted back to wanting drugs constantly.  The son says this is all noon.  The nursing home told him that this has been going on for this past week.  Patient denies any weakness on 1 side.  Patient denies any severe headache.  The son is concerned that he may have had a possible stroke.  Patient's drug screen is negative.  Patient is able to move all extremities.    Amount and/or Complexity of Data Reviewed  Labs:  ordered.  Radiology: ordered.  ECG/medicine tests: ordered.     Details: Normal sinus rhythm 87, PR interval 160 MS, QT interval 398 MS, left axis deviation, flat T-waves 2, 3, AVF, V4 through V6, poor R-wave progression through the precordium.    Risk  Parenteral controlled substances.  Decision regarding hospitalization.             Medications Administered in the ED   NS bolus infusion 1,000 mL (0 mL Intravenous Stopped 03/12/23 1625)     Clinical Impression   Altered mental status, unspecified altered mental status type (Primary)   Garbled speech   Fall   Leukocytosis, unspecified type   Wound of left foot   Cerebrovascular accident (CVA), unspecified mechanism (CMS HCC)       Disposition: Admitted               Clinical Impression   Altered mental status, unspecified altered mental status type (Primary)   Garbled speech   Fall   Leukocytosis, unspecified type   Wound of left foot   Cerebrovascular accident (CVA), unspecified mechanism (CMS HCC)       Current Discharge Medication List

## 2023-03-13 ENCOUNTER — Encounter (HOSPITAL_COMMUNITY): Payer: Self-pay | Admitting: Emergency Medicine

## 2023-03-13 ENCOUNTER — Observation Stay (HOSPITAL_COMMUNITY): Payer: Medicare PPO

## 2023-03-13 DIAGNOSIS — R4182 Altered mental status, unspecified: Secondary | ICD-10-CM

## 2023-03-13 DIAGNOSIS — F419 Anxiety disorder, unspecified: Secondary | ICD-10-CM

## 2023-03-13 DIAGNOSIS — W19XXXA Unspecified fall, initial encounter: Secondary | ICD-10-CM

## 2023-03-13 DIAGNOSIS — S80212A Abrasion, left knee, initial encounter: Secondary | ICD-10-CM

## 2023-03-13 DIAGNOSIS — E119 Type 2 diabetes mellitus without complications: Secondary | ICD-10-CM

## 2023-03-13 DIAGNOSIS — Z87891 Personal history of nicotine dependence: Secondary | ICD-10-CM

## 2023-03-13 DIAGNOSIS — Z794 Long term (current) use of insulin: Secondary | ICD-10-CM

## 2023-03-13 DIAGNOSIS — G9341 Metabolic encephalopathy: Secondary | ICD-10-CM

## 2023-03-13 LAB — RETICULOCYTE COUNT
IMMATURE RETIC FRACTION: 0.32 (ref 0.29–0.53)
RETICULOCYTE % AUTOMATED: 1.07 % (ref 0.5–2.17)
RETICULOCYTES COUNT # AUTOMATED: 0.0443 10*6/uL (ref 0.0221–0.0963)

## 2023-03-13 LAB — VITAMIN B12: VITAMIN B 12: 221 pg/mL (ref 180–914)

## 2023-03-13 LAB — BASIC METABOLIC PANEL
ANION GAP: 13 mmol/L (ref 4–13)
BUN/CREA RATIO: 10 (ref 6–22)
BUN: 40 mg/dL — ABNORMAL HIGH (ref 7–25)
CALCIUM: 8.2 mg/dL — ABNORMAL LOW (ref 8.6–10.3)
CHLORIDE: 106 mmol/L (ref 98–107)
CO2 TOTAL: 17 mmol/L — ABNORMAL LOW (ref 21–31)
CREATININE: 4.21 mg/dL — ABNORMAL HIGH (ref 0.60–1.30)
ESTIMATED GFR: 15 mL/min/{1.73_m2} — ABNORMAL LOW (ref >59)
GLUCOSE: 63 mg/dL — ABNORMAL LOW (ref 74–109)
OSMOLALITY, CALCULATED: 280 mOsm/kg (ref 270–290)
POTASSIUM: 3.1 mmol/L — ABNORMAL LOW (ref 3.5–5.1)
SODIUM: 136 mmol/L (ref 136–145)

## 2023-03-13 LAB — POC BLOOD GLUCOSE (RESULTS)
GLUCOSE, POC: 98 mg/dl (ref 70–100)
GLUCOSE, POC: 96 mg/dl (ref 70–100)
GLUCOSE, POC: 90 mg/dl (ref 70–100)

## 2023-03-13 LAB — IRON TRANSFERRIN AND TIBC
IRON (TRANSFERRIN) SATURATION: 13 % — ABNORMAL LOW (ref 20–50)
IRON: 25 ug/dL — ABNORMAL LOW (ref 50–212)
TOTAL IRON BINDING CAPACITY: 190 ug/dL — ABNORMAL LOW (ref 250–450)
TRANSFERRIN: 136 mg/dL — ABNORMAL LOW (ref 203–362)
UIBC: 165 ug/dL (ref 130–375)

## 2023-03-13 LAB — CBC WITH DIFF
BASOPHIL #: 0.1 10*3/uL (ref 0.00–0.10)
BASOPHIL %: 1 % (ref 0–1)
EOSINOPHIL #: 0.3 10*3/uL (ref 0.00–0.50)
EOSINOPHIL %: 2 %
HCT: 34.7 % — ABNORMAL LOW (ref 36.7–47.1)
HGB: 11.1 g/dL — ABNORMAL LOW (ref 12.5–16.3)
LYMPHOCYTE #: 1.1 10*3/uL (ref 1.00–3.00)
LYMPHOCYTE %: 7 % — ABNORMAL LOW (ref 16–44)
MCH: 26.8 pg (ref 23.8–33.4)
MCHC: 31.9 g/dL — ABNORMAL LOW (ref 32.5–36.3)
MCV: 83.9 fL (ref 73.0–96.2)
MONOCYTE #: 0.9 10*3/uL (ref 0.30–1.00)
MONOCYTE %: 5 % (ref 5–13)
MPV: 8.8 fL (ref 7.4–11.4)
NEUTROPHIL #: 14.3 10*3/uL — ABNORMAL HIGH (ref 1.85–7.80)
NEUTROPHIL %: 86 % — ABNORMAL HIGH (ref 43–77)
PLATELETS: 289 10*3/uL (ref 140–440)
RBC: 4.14 10*6/uL (ref 4.06–5.63)
RDW: 16.9 % — ABNORMAL HIGH (ref 12.1–16.2)
WBC: 16.7 10*3/uL — ABNORMAL HIGH (ref 3.6–10.2)

## 2023-03-13 LAB — FOLATE: FOLATE: 18.2 ng/mL (ref 5.9–24.4)

## 2023-03-13 LAB — MAGNESIUM: MAGNESIUM: 1.9 mg/dL (ref 1.9–2.7)

## 2023-03-13 LAB — FERRITIN: FERRITIN: 86 ng/mL (ref 11–336)

## 2023-03-13 MED ORDER — SODIUM BICARBONATE 1 MEQ/ML (8.4 %) INTRAVENOUS SOLUTION
INTRAVENOUS | Status: DC
Start: 2023-03-13 — End: 2023-03-14
  Filled 2023-03-13 (×7): qty 150

## 2023-03-13 MED ORDER — DEXTROSE 40 % ORAL GEL
15.0000 g | ORAL | Status: DC | PRN
Start: 2023-03-13 — End: 2023-03-21

## 2023-03-13 MED ORDER — DOCUSATE SODIUM 100 MG CAPSULE
100.0000 mg | ORAL_CAPSULE | Freq: Two times a day (BID) | ORAL | Status: DC
Start: 2023-03-13 — End: 2023-03-21
  Administered 2023-03-13 – 2023-03-14 (×3): 100 mg via ORAL
  Administered 2023-03-14: 0 mg via ORAL
  Administered 2023-03-15: 100 mg via ORAL
  Administered 2023-03-15: 0 mg via ORAL
  Administered 2023-03-16 – 2023-03-17 (×4): 100 mg via ORAL
  Administered 2023-03-18: 0 mg via ORAL
  Administered 2023-03-18 – 2023-03-21 (×6): 100 mg via ORAL
  Filled 2023-03-13 (×17): qty 1

## 2023-03-13 MED ORDER — ERTAPENEM 1 GRAM SOLUTION FOR INJECTION
1.0000 g | INTRAMUSCULAR | Status: DC
Start: 2023-03-13 — End: 2023-03-14
  Administered 2023-03-13 – 2023-03-14 (×2): 1 g via INTRAMUSCULAR
  Filled 2023-03-13 (×2): qty 10

## 2023-03-13 MED ORDER — GLUCAGON 1 MG/ML SOLUTION FOR INJECTION
1.0000 mg | Freq: Once | INTRAMUSCULAR | Status: DC | PRN
Start: 2023-03-13 — End: 2023-03-21

## 2023-03-13 MED ORDER — POTASSIUM CHLORIDE ER 20 MEQ TABLET,EXTENDED RELEASE(PART/CRYST)
20.0000 meq | ORAL_TABLET | Freq: Two times a day (BID) | ORAL | Status: AC
Start: 2023-03-13 — End: 2023-03-15
  Administered 2023-03-13 – 2023-03-14 (×2): 20 meq via ORAL
  Administered 2023-03-14: 0 meq via ORAL
  Filled 2023-03-13 (×3): qty 1

## 2023-03-13 MED ORDER — POTASSIUM CHLORIDE 20 MEQ/100ML IN STERILE WATER INTRAVENOUS PIGGYBACK
20.0000 meq | INJECTION | Freq: Once | INTRAVENOUS | Status: AC
Start: 2023-03-13 — End: 2023-03-13
  Administered 2023-03-13: 0 meq via INTRAVENOUS
  Administered 2023-03-13: 20 meq via INTRAVENOUS
  Filled 2023-03-13: qty 100

## 2023-03-13 MED ORDER — BACLOFEN 10 MG TABLET
10.0000 mg | ORAL_TABLET | Freq: Two times a day (BID) | ORAL | Status: DC
Start: 2023-03-13 — End: 2023-03-14
  Administered 2023-03-14: 0 mg via ORAL

## 2023-03-13 MED ORDER — ASPIRIN 325 MG TABLET
325.0000 mg | ORAL_TABLET | Freq: Every day | ORAL | Status: DC
Start: 2023-03-13 — End: 2023-03-21
  Administered 2023-03-13 – 2023-03-21 (×9): 325 mg via ORAL
  Filled 2023-03-13 (×9): qty 1

## 2023-03-13 MED ORDER — DEXTROSE 50 % IN WATER (D50W) INTRAVENOUS SYRINGE
12.5000 g | INJECTION | INTRAVENOUS | Status: DC | PRN
Start: 2023-03-13 — End: 2023-03-21
  Administered 2023-03-14: 25 mL via INTRAVENOUS
  Filled 2023-03-13: qty 50

## 2023-03-13 MED ORDER — HYDROXYZINE PAMOATE 25 MG CAPSULE
25.0000 mg | ORAL_CAPSULE | Freq: Once | ORAL | Status: AC
Start: 2023-03-13 — End: 2023-03-13
  Administered 2023-03-13: 25 mg via ORAL
  Filled 2023-03-13: qty 1

## 2023-03-13 MED ORDER — BUPRENORPHINE 2 MG-NALOXONE 0.5 MG SUBLINGUAL TABLET
1.0000 | SUBLINGUAL_TABLET | Freq: Three times a day (TID) | SUBLINGUAL | Status: DC
Start: 2023-03-13 — End: 2023-03-14
  Administered 2023-03-14: 0 via SUBLINGUAL

## 2023-03-13 MED ORDER — LEVOFLOXACIN 500 MG TABLET
500.0000 mg | ORAL_TABLET | ORAL | Status: DC
Start: 2023-03-13 — End: 2023-03-14
  Administered 2023-03-14: 0 mg via ORAL

## 2023-03-13 MED ORDER — ACETAMINOPHEN 1,000 MG/100 ML (10 MG/ML) INTRAVENOUS SOLUTION
1000.0000 mg | Freq: Four times a day (QID) | INTRAVENOUS | Status: AC | PRN
Start: 2023-03-13 — End: 2023-03-14
  Administered 2023-03-13: 0 mg via INTRAVENOUS
  Administered 2023-03-13: 1000 mg via INTRAVENOUS
  Filled 2023-03-13: qty 100

## 2023-03-13 MED ORDER — INSULIN LISPRO 100 UNIT/ML SUB-Q SSIP VIAL
0.0000 [IU] | INJECTION | Freq: Four times a day (QID) | SUBCUTANEOUS | Status: DC
Start: 2023-03-13 — End: 2023-03-21
  Administered 2023-03-13 – 2023-03-15 (×12): 0 [IU] via SUBCUTANEOUS
  Administered 2023-03-16: 2 [IU] via SUBCUTANEOUS
  Administered 2023-03-16: 0 [IU] via SUBCUTANEOUS
  Administered 2023-03-16: 2 [IU] via SUBCUTANEOUS
  Administered 2023-03-16: 0 [IU] via SUBCUTANEOUS
  Administered 2023-03-17 (×3): 3 [IU] via SUBCUTANEOUS
  Administered 2023-03-17: 4 [IU] via SUBCUTANEOUS
  Administered 2023-03-18 (×2): 3 [IU] via SUBCUTANEOUS
  Administered 2023-03-18: 2 [IU] via SUBCUTANEOUS
  Administered 2023-03-18: 3 [IU] via SUBCUTANEOUS
  Administered 2023-03-19: 2 [IU] via SUBCUTANEOUS
  Administered 2023-03-19: 0 [IU] via SUBCUTANEOUS
  Administered 2023-03-20 (×3): 2 [IU] via SUBCUTANEOUS
  Administered 2023-03-20: 0 [IU] via SUBCUTANEOUS
  Administered 2023-03-21 (×2): 1 [IU] via SUBCUTANEOUS
  Administered 2023-03-21: 2 [IU] via SUBCUTANEOUS
  Filled 2023-03-13: qty 2
  Filled 2023-03-13: qty 1
  Filled 2023-03-13: qty 2
  Filled 2023-03-13: qty 4
  Filled 2023-03-13 (×5): qty 2
  Filled 2023-03-13 (×3): qty 3
  Filled 2023-03-13: qty 2
  Filled 2023-03-13 (×2): qty 3
  Filled 2023-03-13: qty 1
  Filled 2023-03-13: qty 3

## 2023-03-13 MED ORDER — HEPARIN (PORCINE) 5,000 UNIT/ML INJECTION SOLUTION
5000.0000 [IU] | Freq: Two times a day (BID) | INTRAMUSCULAR | Status: DC
Start: 2023-03-13 — End: 2023-03-21
  Administered 2023-03-13 – 2023-03-17 (×10): 5000 [IU] via SUBCUTANEOUS
  Administered 2023-03-18: 0 [IU] via SUBCUTANEOUS
  Administered 2023-03-18 – 2023-03-21 (×6): 5000 [IU] via SUBCUTANEOUS
  Filled 2023-03-13 (×17): qty 1

## 2023-03-13 MED ORDER — DAPAGLIFLOZIN PROPANEDIOL 10 MG TABLET
10.0000 mg | ORAL_TABLET | Freq: Every day | ORAL | Status: DC
Start: 2023-03-13 — End: 2023-03-14
  Administered 2023-03-14: 0 mg via ORAL

## 2023-03-13 NOTE — Telemedicine Consult (Signed)
E-Consult Note      [name] [name]  28-Jul-1961  62 y.o., male  Date of Service: 03/13/2023    Provider that requested consult: Dr. Leonette Most        Reason for Consult: AKI on CKD         Relevant Components of Epic chart/labs/radiology reviewed: yes        Recommendations:       AKI on CKD 4  -prerenal   -baseline Cr 2.7  -Scr 4.21* (07/04 0431)  -IV bicarb drip  -labs in am   -UA was reviewed  -U/S reviewed  -no need for RRT    Electrolytes  -replace K.    ACID-BASE:  -acidosis/ bicarb         Time Spent:  5 MIN       [name], MD

## 2023-03-13 NOTE — Progress Notes (Signed)
[facility]     HOSPITALIST PROGRESS NOTE    Assessment/Plan:    [name] [name] is a 62 y.o. male who presented to [facility] with AMS (altered mental status).      Acute metabolic encephalopathy   Auditory and visual hallucination   Multifactorial:  Uremia, narcotics  CT Head WNL  Frequent neuro checks   Low threshold for utilization of chemical/physical restraints   Treat underlying issues     Acute renal failure   Probably baseline CKD Stage 3  Metabolic acidemia  Multifactorial:  Prerenal Azotemia, adverse effects of meds   Start bicarb drip   Renal ultrasound   Avoid all nephrotoxins  Nephrology consult    Opiate use disorder   On Suboxone 3 times daily  Dose appears to be too high   Will hold Suboxone for now until mentation improves  Check UDS      Code Status: Full Code    VTE prophylaxis: HSQ    Disposition: TBD       LOS: 0 days     Subjective     Patient seen and examined at bedside.   Patient was very confused   He was experiencing visual and auditory hallucinations   He appears to be comfortable   Will need to get medical records from nursing facility since it appears that he might be on multiple antibiotics for unknown reasons   Will get Nephrology input   Continue current lines of care    Objective     Vital signs in last 24 hours:  Filed Vitals:    03/13/23 0051 03/13/23 0125 03/13/23 0445 03/13/23 0812   BP:   138/77 (!) 152/75   Pulse: 79  82 82   Resp:    20   Temp:   (!) 35.9 C (96.6 F) 36.3 C (97.4 F)   SpO2:  94% 91% 92%       Intake/Output last 3 shifts:    Intake/Output Summary (Last 24 hours) at 03/13/2023 1029  Last data filed at 03/13/2023 0247  Gross per 24 hour   Intake 1100 ml   Output --   Net 1100 ml        Physical Exam:    General: Well-developed, well-nourished.  Pleasantly confused  Cardiovascular:  Regular rate, normal rhythm, S1 and S2 present, no murmur, no gallop, no rub, normal peripheral perfusion  Respiratory: Clear to auscultation  bilaterally, bilateral breath sounds, normal effort  Gastrointestinal: Soft, benign, nondistended, nontender, no mass  Extremities: No gross deformity, no cyanosis or edema  Skin: Warm, dry; no rashes or lesions    In-Hospital Medications:    Current Facility-Administered Medications   Medication Dose Route Frequency    acetaminophen (OFIRMEV) 1,000 mg (10 mg/mL) IV 100 mL (tot vol)  1,000 mg Intravenous Q6H PRN    aspirin tablet 325 mg  325 mg Oral Daily    [Held by provider] baclofen (LIORESAL) tablet  10 mg Oral 2x/day    [Held by provider] buprenorphine-naloxone (SUBOXONE) 2-0.5mg  per sublingual tablet  1 Tablet Sublingual 3x/day    Correction/SSIP insulin lispro (HumaLOG) 100 units/mL injection  0-18 Units Subcutaneous 4x/day AC    D5W 1,000 mL with sodium bicarbonate 150 mEq infusion   Intravenous Continuous    [Held by provider] dapagliflozin (FARXIGA) tablet  10 mg Oral Daily    dextrose (GLUTOSE) 40% oral gel  15 g Oral Q15 Min PRN    dextrose 50% (0.5 g/mL) injection -  syringe  12.5 g Intravenous Q15 Min PRN    docusate sodium (COLACE) capsule  100 mg Oral 2x/day    ertapenem (INVanz) injection  1 g IntraMUSCULAR Q24H    glucagon (GLUCAGEN DIAGNOSTIC KIT) injection 1 mg  1 mg IntraMUSCULAR Once PRN    heparin 5,000 unit/mL injection  5,000 Units Subcutaneous Q12H    [Held by provider] levoFLOXacin (LEVAQUIN) tablet  500 mg Oral Q24H          Labs:    Results for orders placed or performed during the hospital encounter of 03/12/23 (from the past 24 hour(s))   URINE DRUG SCREEN    Collection Time: 03/12/23  1:22 PM   Result Value Ref Range    AMPHETAMINES URINE Negative Negative    BARBITURATES URINE Negative Negative    BENZODIAZEPINES URINE Negative Negative    METHADONE URINE Negative Negative    COCAINE METABOLITES URINE Negative Negative    OPIATES URINE Negative Negative    PCP URINE Negative Negative    CANNABINOIDS URINE Negative Negative   URINALYSIS, MACRO/MICRO    Collection Time: 03/12/23  1:22  PM   Result Value Ref Range    COLOR Light Yellow (A) Yellow    APPEARANCE Slightly Hazy (A) Clear    SPECIFIC GRAVITY 1.025 1.003 - 1.035    PH 6.0 4.6 - 8.0    LEUKOCYTES Negative Negative WBCs/uL    NITRITE Negative Negative    PROTEIN >= 300 (A) Negative mg/dL    GLUCOSE 161 (A) Negative mg/dL    KETONES Trace (A) Negative mg/dL    BILIRUBIN Negative Negative mg/dL    BLOOD Moderate (A) Negative mg/dL    UROBILINOGEN 0.2 0.2 - 1.0 mg/dL   URINALYSIS, MICROSCOPIC    Collection Time: 03/12/23  1:22 PM   Result Value Ref Range    RBCS 11-15 (A) 0-3, Not Present /hpf    BACTERIA Few (A) Negative /hpf    MUCOUS Few (A) (none) /hpf    WBCS 0-5 Not Present, Occasional, 0-5 /hpf    SQUAMOUS EPITHELIAL Few Not Present, Few /hpf   COMPREHENSIVE METABOLIC PANEL, NON-FASTING    Collection Time: 03/12/23  1:43 PM   Result Value Ref Range    SODIUM 139 136 - 145 mmol/L    POTASSIUM 3.2 (L) 3.5 - 5.1 mmol/L    CHLORIDE 103 98 - 107 mmol/L    CO2 TOTAL 22 21 - 32 mmol/L    ANION GAP 14 (H) 4 - 13 mmol/L    BUN 41 (H) 7 - 18 mg/dL    CREATININE 0.96 (H) 0.70 - 1.30 mg/dL    BUN/CREA RATIO 9     ESTIMATED GFR 13 (L) >59 mL/min/1.47m^2    ALBUMIN 2.3 (L) 3.4 - 5.0 g/dL    CALCIUM 8.4 (L) 8.5 - 10.1 mg/dL    GLUCOSE 90 74 - 045 mg/dL    ALKALINE PHOSPHATASE 86 46 - 116 U/L    ALT (SGPT) 12 <=78 U/L    AST (SGOT) 13 (L) 15 - 37 U/L    BILIRUBIN TOTAL 0.5 0.2 - 1.0 mg/dL    PROTEIN TOTAL 7.6 6.4 - 8.2 g/dL    ALBUMIN/GLOBULIN RATIO 0.4 (L) 0.8 - 1.4    OSMOLALITY, CALCULATED 287 270 - 290 mOsm/kg    CALCIUM, CORRECTED 9.8 mg/dL    GLOBULIN 5.3    CBC WITH DIFF    Collection Time: 03/12/23  1:43 PM   Result Value Ref Range  WBC 18.7 (H) 4.0 - 10.5 x10^3/uL    RBC 4.31 4.20 - 6.00 x10^6/uL    HGB 11.8 (L) 13.5 - 18.0 g/dL    HCT 91.4 (L) 78.2 - 51.0 %    MCV 86.2 78.0 - 99.0 fL    MCH 27.5 27.0 - 32.0 pg    MCHC 31.8 (L) 32.0 - 36.0 g/dL    RDW 95.6 (H) 21.3 - 14.8 %    PLATELETS 349 140 - 440 x10^3/uL    MPV 8.1 7.4 - 10.4 fL     NEUTROPHIL % 89 (H) 40 - 76 %    LYMPHOCYTE % 6 (L) 25 - 45 %    MONOCYTE % 5 0 - 12 %    EOSINOPHIL % 1 0 - 7 %    BASOPHIL % 0 0 - 3 %    NEUTROPHIL # 16.56 (H) 1.80 - 8.40 x10^3/uL    LYMPHOCYTE # 1.08 (L) 1.10 - 5.00 x10^3/uL    MONOCYTE # 0.91 0.00 - 1.30 x10^3/uL    EOSINOPHIL # 0.13 0.00 - 0.80 x10^3/uL    BASOPHIL # 0.02 0.00 - 0.30 x10^3/uL   CREATINE KINASE (CK), TOTAL, SERUM OR PLASMA    Collection Time: 03/12/23  2:22 PM   Result Value Ref Range    CREATINE KINASE 92 39 - 308 U/L   AMMONIA    Collection Time: 03/12/23  2:31 PM   Result Value Ref Range    AMMONIA <10 (L) 11 - 32 umol/L   GOLD TOP TUBE    Collection Time: 03/12/23  2:41 PM   Result Value Ref Range    RAINBOW/EXTRA TUBE AUTO RESULT Yes    GRAY TOP TUBE    Collection Time: 03/12/23  2:41 PM   Result Value Ref Range    RAINBOW/EXTRA TUBE AUTO RESULT Yes    ECG 12 LEAD    Collection Time: 03/12/23  3:09 PM   Result Value Ref Range    Ventricular rate 87 BPM    Atrial Rate 87 BPM    PR Interval 160 ms    QRS Duration 108 ms    QT Interval 398 ms    QTC Calculation 478 ms    Calculated P Axis 5 degrees    Calculated R Axis -65 degrees    Calculated T Axis 24 degrees   BASIC METABOLIC PANEL, NON-FASTING    Collection Time: 03/13/23  4:31 AM   Result Value Ref Range    SODIUM 136 136 - 145 mmol/L    POTASSIUM 3.1 (L) 3.5 - 5.1 mmol/L    CHLORIDE 106 98 - 107 mmol/L    CO2 TOTAL 17 (L) 21 - 31 mmol/L    ANION GAP 13 4 - 13 mmol/L    CALCIUM 8.2 (L) 8.6 - 10.3 mg/dL    GLUCOSE 63 (L) 74 - 109 mg/dL    BUN 40 (H) 7 - 25 mg/dL    CREATININE 0.86 (H) 0.60 - 1.30 mg/dL    BUN/CREA RATIO 10 6 - 22    ESTIMATED GFR 15 (L) >59 mL/min/1.27m^2    OSMOLALITY, CALCULATED 280 270 - 290 mOsm/kg   MAGNESIUM    Collection Time: 03/13/23  4:31 AM   Result Value Ref Range    MAGNESIUM 1.9 1.9 - 2.7 mg/dL   CBC WITH DIFF    Collection Time: 03/13/23  4:31 AM   Result Value Ref Range    WBC 16.7 (H) 3.6 - 10.2 x10^3/uL  RBC 4.14 4.06 - 5.63 x10^6/uL    HGB 11.1 (L)  12.5 - 16.3 g/dL    HCT 19.1 (L) 47.8 - 47.1 %    MCV 83.9 73.0 - 96.2 fL    MCH 26.8 23.8 - 33.4 pg    MCHC 31.9 (L) 32.5 - 36.3 g/dL    RDW 29.5 (H) 62.1 - 16.2 %    PLATELETS 289 140 - 440 x10^3/uL    MPV 8.8 7.4 - 11.4 fL    NEUTROPHIL % 86 (H) 43 - 77 %    LYMPHOCYTE % 7 (L) 16 - 44 %    MONOCYTE % 5 5 - 13 %    EOSINOPHIL % 2 %    BASOPHIL % 1 0 - 1 %    NEUTROPHIL # 14.30 (H) 1.85 - 7.80 x10^3/uL    LYMPHOCYTE # 1.10 1.00 - 3.00 x10^3/uL    MONOCYTE # 0.90 0.30 - 1.00 x10^3/uL    EOSINOPHIL # 0.30 0.00 - 0.50 x10^3/uL    BASOPHIL # 0.10 0.00 - 0.10 x10^3/uL   RETICULOCYTE COUNT    Collection Time: 03/13/23  4:31 AM   Result Value Ref Range    RETICULOCYTE % AUTOMATED 1.07 0.5 - 2.17 %    IMMATURE RETIC FRACTION 0.32 0.29 - 0.53    RETICULOCYTES COUNT # AUTOMATED 0.0443 0.0221 - 0.0963 10(6)/uL   FERRITIN    Collection Time: 03/13/23  4:31 AM   Result Value Ref Range    FERRITIN 86 11 - 336 ng/mL   VITAMIN B12    Collection Time: 03/13/23  4:31 AM   Result Value Ref Range    VITAMIN B 12 221 180 - 914 pg/mL   FOLATE    Collection Time: 03/13/23  4:31 AM   Result Value Ref Range    FOLATE 18.2 5.9 - 24.4 ng/mL   IRON TRANSFERRIN AND TIBC    Collection Time: 03/13/23  4:31 AM   Result Value Ref Range    TOTAL IRON BINDING CAPACITY 190 (L) 250 - 450 ug/dL    IRON (TRANSFERRIN) SATURATION 13 (L) 20 - 50 %    IRON 25 (L) 50 - 212 ug/dL    TRANSFERRIN 308 (L) 203 - 362 mg/dL    UIBC 657 846 - 962 ug/dL   POC BLOOD GLUCOSE (RESULTS)    Collection Time: 03/13/23  5:55 AM   Result Value Ref Range    GLUCOSE, POC 98 70 - 100 mg/dl       Micro:    No results found for any visits on 03/12/23 (from the past 24 hour(s)).     Imaging:    XR CHEST PA AND LATERAL  Narrative: [name]    RADIOLOGIST: Alvester Chou, MD    XR CHEST PA AND LATERAL performed on 03/12/2023 2:47 PM    CLINICAL HISTORY: Altered mental status.  fell today. altered mental status    TECHNIQUE: Frontal and lateral views of the chest.    COMPARISON:   None    FINDINGS:    The heart size is normal.  The mediastinal contour is unremarkable.  The lungs are clear.   Degenerative changes are identified within the thoracic spine.    Impression: NO ACUTE FINDINGS.      Radiologist location ID: [id number]  ECG 12 LEAD  Normal sinus rhythm  Left axis deviation  Inferior infarct , age undetermined  Cannot rule out Anterior infarct , age undetermined  Abnormal ECG  When compared  with ECG of 01-Oct-2022 10:56,  Inferior infarct is now present  XR KNEE LEFT 4 OR MORE VIEWS  Narrative: [name]    RADIOLOGIST: Christopher Nicely    XR KNEE LEFT 4 OR MORE VIEWS performed on 03/12/2023 1:37 PM    CLINICAL HISTORY: Traumatic fall.  Fall today with lacerations to anterior surface of left knee.    TECHNIQUE:  4 view(s) of the left knee    COMPARISON:  None.    FINDINGS:   No fracture.  No suspicious bone lesion.  Normal alignment.  Mild degenerative changes.  No effusion.  Soft tissues are unremarkable.  Osteopenia.    Extensive vascular calcifications.  Impression: DEGENERATIVE OSTEOARTHROSIS. NO ACUTE FINDINGS.     Radiologist location ID: [id number]  CT BRAIN WO IV CONTRAST  Narrative: [name]    RADIOLOGIST: Mallie Mussel    CT BRAIN WO IV CONTRAST performed on 03/12/2023 1:22 PM    CLINICAL HISTORY: Traumatic fall.  Patient was found in the floor after a fall by nursing home staff. Nursing home called report that pt also has altered mental status and they found "3 strips of Suboxone in his bed that was not his"    TECHNIQUE:  Head CT without intravenous contrast.    COMPARISON: 06/22/2017    FINDINGS:  There is no acute intracranial hemorrhage, mass effect, or evidence of large acute infarct.    Brain: Low density in the periventricular white matter suggests mild chronic small vessel ischemic changes.    CSF Spaces: Mild generalized cerebral atrophy     Sinuses/Mastoids:  Mild mucosal thickening at the paranasal sinuses     Bones: Unremarkable  Impression: CHRONIC  CHANGES.  NO ACUTE FINDINGS.     One or more dose reduction techniques were used (e.g., Automated exposure control, adjustment of the mA and/or kV according to patient size, use of iterative reconstruction technique).    Radiologist location ID: [id number]         Audrea Muscat, MD  March 13, 2023    I personally spent 50 minutes face-to-face and non-face-to-face in the care of this patient, which includes all pre, intra, and post visit time on the date of service.  All documented time was specific to the E/M visit and does not include any procedures that may have been performed.

## 2023-03-13 NOTE — Nurses Notes (Signed)
Several attempts to reach [facility] to clarify medication list. No answer or placed on hold until call is ended. Fax sent with request. Will continue to reach out to facility to clarify home med list.

## 2023-03-13 NOTE — H&P (Signed)
[name]  History and Physical    Date of Service:  03/13/2023  [name] [name], 62 y.o. male  Encounter Start Date:  03/12/2023  Inpatient Admission Date:   Date of Birth:  07-Dec-1960  PCP: [name] Don, MD       Chief Complaint:  AMS    HPI: [name] [name] is a 62 y.o., White male who presents to [facility] from [facility] with complaints of altered mental status.  Patient was seen and examined at bedside.  Patient is a nursing home patient, and is a resident at [facility] nursing home.  Apparently patient had a fall around 4:00 a.m. yesterday morning was unable to bear weight on his left knee.  Patient does have an abrasion to left knee.  Patient had imaging studies in the ED that were all negative for anything acute.  Apparently patient had some altered mental status and they found 3 Suboxone strips in his bed that were not his.  Patient is now back to his normal mentation, requesting pain medication and something for anxiety.  Patient denies any other complaints at this time.  Patient will be placed in observation for further evaluation.    Past Medical History:    Past Medical History:   Diagnosis Date    Anxiety     Depression     Diabetes (CMS HCC)     Diabetes mellitus, type 2 (CMS HCC)     GERD (gastroesophageal reflux disease)     MRSA (methicillin resistant staph aureus) culture positive 08/06/2019    L eye cellulitis    Osteomyelitis (CMS HCC)     Peripheral neuropathy              Medications Prior to Admission       Prescriptions    acetaminophen (TYLENOL) 325 mg Oral Tablet    Take 2 Tablets (650 mg total) by mouth Every 6 hours as needed for Pain fever    aspirin 325 mg Oral Tablet    Take 1 Tablet (325 mg total) by mouth Once a day    baclofen (LIORESAL) 10 mg Oral Tablet    Take 1 Tablet (10 mg total) by mouth Twice daily    buprenorphine-naloxone (SUBOXONE) 2-0.5 mg Sublingual Film    Place 1 Film under the tongue Three times a day    calcium carbonate (TUMS ORAL)    Take by mouth     cholecalciferol, Vitamin D3, (VITAMIN D-3) 125 mcg (5,000 unit) Oral Tablet    Take 1 Tablet (5,000 Units total) by mouth Once a day    Cimetidine (TAGAMET) 300 mg Oral Tablet    Take 1 Tablet (300 mg total) by mouth Twice daily    dapagliflozin propanediol (FARXIGA) 10 mg Oral Tablet    Take 1 Tablet (10 mg total) by mouth Once a day    diclofenac sodium (VOLTAREN ARTHRITIS PAIN) 1 % Gel    Apply topically Twice daily    docusate sodium (COLACE) 100 mg Oral Capsule    Take 1 Capsule (100 mg total) by mouth Twice daily    ertapenem (INVANZ) 1 gram Injection Recon Soln infusion    Infuse into a venous catheter Every 24 hours IM Q 24 hours for wound infection started 03/03/23    insulin glargine,hum.rec.anlog (LANTUS SOLOSTAR U-100 INSULIN SUBQ)    Inject under the skin 65 units subcutaneously at bedtime    insulin lispro (HUMALOG) 100 unit/mL Subcutaneous Solution    Inject 15 Units under the skin  Three times daily before meals    levoFLOXacin (LEVAQUIN) 500 mg Oral Tablet    Take 1 Tablet (500 mg total) by mouth Every 24 hours    protein supplement (PROMOD) Oral Liquid    Take 30 mL by mouth    Saccharomyces boulardii (FLORASTOR) 250 mg Oral Capsule    Take 250 mg by mouth Twice daily            Allergies   Allergen Reactions    Penicillins     Tramadol        Past Surgical History:  Past Surgical History:   Procedure Laterality Date    MANDIBLE SURGERY      SHOULDER SURGERY             Family History:  Family Medical History:       Problem Relation (Age of Onset)    Diabetes Mother, Father               Social History:  Social History     Tobacco Use    Smoking status: Former     Types: Cigars    Smokeless tobacco: Never   Vaping Use    Vaping status: Former   Substance Use Topics    Alcohol use: Yes     Comment: Drinks 3-4 beers daily.    Drug use: Never        Review of Systems:  All systems are reviewed and are negative except those mentioned in the HPI portion    Examination:  BP (!) 155/72   Pulse 79   Temp  36.2 C (97.1 F)   Resp 18   SpO2 97%         General: Patient is alert and oriented to person, place and time.    HEENT: Pupils are of round shape, equal in size, and reactive to light bilaterally. Oral mucous membranes are moist.    Heart: S1 and S2 are present. No appreciable murmur.    Lungs: Breath sounds are appreciated at all posterior lung fields, no appreciable crackles, wheezes, or rhonchi.    Gastrointestinal: Bowel sounds are appreciated at all 4 quadrants. Abdomen is soft, not appreciably distended, non-tender to palpation at all quadrants.    Extremities: Radial pulses are 3/4 bilaterally, dorsalis pedis pulses are 3/4 bilaterally. Capillary refill is less than 3 seconds at distal digits bilaterally. No appreciable edema of the lower extremities.    Genitourinary: No appreciable suprapubic tenderness.    Neurologic: Follows commands appropriately. No appreciable facial droop. No appreciable focal weakness of the bilateral upper or lower extremities.    Skin: Grossly intact at observable areas.    Labs:    Lab Results Today:    Results for orders placed or performed during the hospital encounter of 03/12/23 (from the past 24 hour(s))   URINE DRUG SCREEN   Result Value Ref Range    AMPHETAMINES URINE Negative Negative    BARBITURATES URINE Negative Negative    BENZODIAZEPINES URINE Negative Negative    METHADONE URINE Negative Negative    COCAINE METABOLITES URINE Negative Negative    OPIATES URINE Negative Negative    PCP URINE Negative Negative    CANNABINOIDS URINE Negative Negative   URINALYSIS, MACRO/MICRO   Result Value Ref Range    COLOR Light Yellow (A) Yellow    APPEARANCE Slightly Hazy (A) Clear    SPECIFIC GRAVITY 1.025 1.003 - 1.035    PH 6.0 4.6 - 8.0    LEUKOCYTES  Negative Negative WBCs/uL    NITRITE Negative Negative    PROTEIN >= 300 (A) Negative mg/dL    GLUCOSE 962 (A) Negative mg/dL    KETONES Trace (A) Negative mg/dL    BILIRUBIN Negative Negative mg/dL    BLOOD Moderate (A)  Negative mg/dL    UROBILINOGEN 0.2 0.2 - 1.0 mg/dL   URINALYSIS, MICROSCOPIC   Result Value Ref Range    RBCS 11-15 (A) 0-3, Not Present /hpf    BACTERIA Few (A) Negative /hpf    MUCOUS Few (A) (none) /hpf    WBCS 0-5 Not Present, Occasional, 0-5 /hpf    SQUAMOUS EPITHELIAL Few Not Present, Few /hpf   COMPREHENSIVE METABOLIC PANEL, NON-FASTING   Result Value Ref Range    SODIUM 139 136 - 145 mmol/L    POTASSIUM 3.2 (L) 3.5 - 5.1 mmol/L    CHLORIDE 103 98 - 107 mmol/L    CO2 TOTAL 22 21 - 32 mmol/L    ANION GAP 14 (H) 4 - 13 mmol/L    BUN 41 (H) 7 - 18 mg/dL    CREATININE 9.52 (H) 0.70 - 1.30 mg/dL    BUN/CREA RATIO 9     ESTIMATED GFR 13 (L) >59 mL/min/1.6m^2    ALBUMIN 2.3 (L) 3.4 - 5.0 g/dL    CALCIUM 8.4 (L) 8.5 - 10.1 mg/dL    GLUCOSE 90 74 - 841 mg/dL    ALKALINE PHOSPHATASE 86 46 - 116 U/L    ALT (SGPT) 12 <=78 U/L    AST (SGOT) 13 (L) 15 - 37 U/L    BILIRUBIN TOTAL 0.5 0.2 - 1.0 mg/dL    PROTEIN TOTAL 7.6 6.4 - 8.2 g/dL    ALBUMIN/GLOBULIN RATIO 0.4 (L) 0.8 - 1.4    OSMOLALITY, CALCULATED 287 270 - 290 mOsm/kg    CALCIUM, CORRECTED 9.8 mg/dL    GLOBULIN 5.3    CBC WITH DIFF   Result Value Ref Range    WBC 18.7 (H) 4.0 - 10.5 x10^3/uL    RBC 4.31 4.20 - 6.00 x10^6/uL    HGB 11.8 (L) 13.5 - 18.0 g/dL    HCT 32.4 (L) 40.1 - 51.0 %    MCV 86.2 78.0 - 99.0 fL    MCH 27.5 27.0 - 32.0 pg    MCHC 31.8 (L) 32.0 - 36.0 g/dL    RDW 02.7 (H) 25.3 - 14.8 %    PLATELETS 349 140 - 440 x10^3/uL    MPV 8.1 7.4 - 10.4 fL    NEUTROPHIL % 89 (H) 40 - 76 %    LYMPHOCYTE % 6 (L) 25 - 45 %    MONOCYTE % 5 0 - 12 %    EOSINOPHIL % 1 0 - 7 %    BASOPHIL % 0 0 - 3 %    NEUTROPHIL # 16.56 (H) 1.80 - 8.40 x10^3/uL    LYMPHOCYTE # 1.08 (L) 1.10 - 5.00 x10^3/uL    MONOCYTE # 0.91 0.00 - 1.30 x10^3/uL    EOSINOPHIL # 0.13 0.00 - 0.80 x10^3/uL    BASOPHIL # 0.02 0.00 - 0.30 x10^3/uL   CREATINE KINASE (CK), TOTAL, SERUM OR PLASMA   Result Value Ref Range    CREATINE KINASE 92 39 - 308 U/L   AMMONIA   Result Value Ref Range    AMMONIA <10  (L) 11 - 32 umol/L   GOLD TOP TUBE   Result Value Ref Range    RAINBOW/EXTRA TUBE AUTO RESULT Yes  GRAY TOP TUBE   Result Value Ref Range    RAINBOW/EXTRA TUBE AUTO RESULT Yes    ECG 12 LEAD   Result Value Ref Range    Ventricular rate 87 BPM    Atrial Rate 87 BPM    PR Interval 160 ms    QRS Duration 108 ms    QT Interval 398 ms    QTC Calculation 478 ms    Calculated P Axis 5 degrees    Calculated R Axis -65 degrees    Calculated T Axis 24 degrees       Imaging Studies:  Results for orders placed or performed during the hospital encounter of 03/12/23 (from the past 24 hour(s))   CT BRAIN WO IV CONTRAST     Status: None    Narrative    [name] [name]    RADIOLOGIST: Christopher Nicely    CT BRAIN WO IV CONTRAST performed on 03/12/2023 1:22 PM    CLINICAL HISTORY: Traumatic fall.  Patient was found in the floor after a fall by nursing home staff. Nursing home called report that pt also has altered mental status and they found "3 strips of Suboxone in his bed that was not his"    TECHNIQUE:  Head CT without intravenous contrast.    COMPARISON: 06/22/2017    FINDINGS:  There is no acute intracranial hemorrhage, mass effect, or evidence of large acute infarct.    Brain: Low density in the periventricular white matter suggests mild chronic small vessel ischemic changes.    CSF Spaces: Mild generalized cerebral atrophy     Sinuses/Mastoids:  Mild mucosal thickening at the paranasal sinuses     Bones: Unremarkable        Impression    CHRONIC CHANGES.  NO ACUTE FINDINGS.       One or more dose reduction techniques were used (e.g., Automated exposure control, adjustment of the mA and/or kV according to patient size, use of iterative reconstruction technique).      Radiologist location ID: [id number]     XR KNEE LEFT 4 OR MORE VIEWS     Status: None    Narrative    [name] [name]    RADIOLOGIST: Christopher Nicely    XR KNEE LEFT 4 OR MORE VIEWS performed on 03/12/2023 1:37 PM    CLINICAL HISTORY: Traumatic fall.  Fall today with  lacerations to anterior surface of left knee.    TECHNIQUE:  4 view(s) of the left knee    COMPARISON:  None.    FINDINGS:   No fracture.  No suspicious bone lesion.  Normal alignment.  Mild degenerative changes.  No effusion.  Soft tissues are unremarkable.  Osteopenia.    Extensive vascular calcifications.      Impression    DEGENERATIVE OSTEOARTHROSIS. NO ACUTE FINDINGS.           Radiologist location ID: [id number]     XR CHEST PA AND LATERAL     Status: None    Narrative    [name] [name]    RADIOLOGIST: Alvester Chou, MD    XR CHEST PA AND LATERAL performed on 03/12/2023 2:47 PM    CLINICAL HISTORY: Altered mental status.  fell today. altered mental status    TECHNIQUE: Frontal and lateral views of the chest.    COMPARISON:  None    FINDINGS:    The heart size is normal.  The mediastinal contour is unremarkable.  The lungs are clear.   Degenerative changes are identified within  the thoracic spine.          Impression    NO ACUTE FINDINGS.        Radiologist location ID: [id number]          Assessment/Plan:   Active Hospital Problems    Diagnosis    Primary Problem: AMS (altered mental status)     AMS  -neuro checks q.4h   -IV Tylenol q.6h will be ordered for pain; p.o. Vistaril 25 mg x 1 will be also given to patient for anxiety   -will hold all other sedating medications for now.  -fall risk and bed alarm will be ordered  -Plan to consult PT/OT to see patient.     Plan to place patient in observation.  Telemetry, continuous pulse ox, and supplemental oxygen titration p.r.n. will be ordered.  Will verify patient's home medications.  Accu-Cheks AC and HS with sliding scale coverage will be ordered for patient.  Patient will be order diabetic diet.  Plan repeat labs in a.m.Marland Kitchen  See attending addendum and orders for further information.    DVT/PE Prophylaxis: SCDs/ Venodynes/Impulse boots and Heparin    Laurita Quint, FNP-BC    Contents of the document, in whole or in part, are completed utilizing M*Modal dictation  technology, please forgive any typographical errors that may exist.

## 2023-03-14 DIAGNOSIS — E1165 Type 2 diabetes mellitus with hyperglycemia: Secondary | ICD-10-CM

## 2023-03-14 DIAGNOSIS — R9431 Abnormal electrocardiogram [ECG] [EKG]: Secondary | ICD-10-CM

## 2023-03-14 DIAGNOSIS — R220 Localized swelling, mass and lump, head: Secondary | ICD-10-CM

## 2023-03-14 DIAGNOSIS — E11649 Type 2 diabetes mellitus with hypoglycemia without coma: Secondary | ICD-10-CM

## 2023-03-14 DIAGNOSIS — E872 Acidosis, unspecified: Secondary | ICD-10-CM

## 2023-03-14 DIAGNOSIS — F1124 Opioid dependence with opioid-induced mood disorder: Secondary | ICD-10-CM

## 2023-03-14 DIAGNOSIS — I252 Old myocardial infarction: Secondary | ICD-10-CM

## 2023-03-14 LAB — CBC WITH DIFF
BASOPHIL #: 0.2 10*3/uL — ABNORMAL HIGH (ref 0.00–0.10)
BASOPHIL %: 1 % (ref 0–1)
EOSINOPHIL #: 0.2 10*3/uL (ref 0.00–0.50)
EOSINOPHIL %: 2 %
HCT: 35.5 % — ABNORMAL LOW (ref 36.7–47.1)
HGB: 11.3 g/dL — ABNORMAL LOW (ref 12.5–16.3)
LYMPHOCYTE #: 1.3 10*3/uL (ref 1.00–3.00)
LYMPHOCYTE %: 9 % — ABNORMAL LOW (ref 16–44)
MCH: 26.3 pg (ref 23.8–33.4)
MCHC: 31.7 g/dL — ABNORMAL LOW (ref 32.5–36.3)
MCV: 82.9 fL (ref 73.0–96.2)
MONOCYTE #: 0.8 10*3/uL (ref 0.30–1.00)
MONOCYTE %: 6 % (ref 5–13)
MPV: 8.5 fL (ref 7.4–11.4)
NEUTROPHIL #: 11.4 10*3/uL — ABNORMAL HIGH (ref 1.85–7.80)
NEUTROPHIL %: 82 % — ABNORMAL HIGH (ref 43–77)
PLATELETS: 273 10*3/uL (ref 140–440)
RBC: 4.29 10*6/uL (ref 4.06–5.63)
RDW: 17.1 % — ABNORMAL HIGH (ref 12.1–16.2)
WBC: 13.9 10*3/uL — ABNORMAL HIGH (ref 3.6–10.2)

## 2023-03-14 LAB — ECG 12 LEAD
Atrial Rate: 87 {beats}/min
Calculated P Axis: 5 degrees
Calculated R Axis: -65 degrees
Calculated T Axis: 24 degrees
PR Interval: 160 ms
QRS Duration: 108 ms
QT Interval: 398 ms
QTC Calculation: 478 ms
Ventricular rate: 87 {beats}/min

## 2023-03-14 LAB — COMPREHENSIVE METABOLIC PANEL, NON-FASTING
ALBUMIN/GLOBULIN RATIO: 0.8 (ref 0.8–1.4)
ALBUMIN: 3 g/dL — ABNORMAL LOW (ref 3.5–5.7)
ALKALINE PHOSPHATASE: 66 U/L (ref 34–104)
ALT (SGPT): <7 U/L — ABNORMAL LOW (ref 7–52)
ANION GAP: 11 mmol/L (ref 4–13)
AST (SGOT): 13 U/L (ref 13–39)
BILIRUBIN TOTAL: 0.6 mg/dL (ref 0.3–1.2)
BUN/CREA RATIO: 9 (ref 6–22)
BUN: 38 mg/dL — ABNORMAL HIGH (ref 7–25)
CALCIUM, CORRECTED: 8.8 mg/dL — ABNORMAL LOW (ref 8.9–10.8)
CALCIUM: 8 mg/dL — ABNORMAL LOW (ref 8.6–10.3)
CHLORIDE: 102 mmol/L (ref 98–107)
CO2 TOTAL: 25 mmol/L (ref 21–31)
CREATININE: 4.23 mg/dL — ABNORMAL HIGH (ref 0.60–1.30)
ESTIMATED GFR: 15 mL/min/{1.73_m2} — ABNORMAL LOW (ref >59)
GLOBULIN: 4 (ref 2.9–5.4)
GLUCOSE: 119 mg/dL — ABNORMAL HIGH (ref 74–109)
OSMOLALITY, CALCULATED: 286 mOsm/kg (ref 270–290)
POTASSIUM: 2.9 mmol/L — ABNORMAL LOW (ref 3.5–5.1)
PROTEIN TOTAL: 7 g/dL (ref 6.4–8.9)
SODIUM: 138 mmol/L (ref 136–145)

## 2023-03-14 LAB — POC BLOOD GLUCOSE (RESULTS)
GLUCOSE, POC: 50 mg/dl — ABNORMAL LOW (ref 70–100)
GLUCOSE, POC: 56 mg/dl — ABNORMAL LOW (ref 70–100)
GLUCOSE, POC: 128 mg/dl — ABNORMAL HIGH (ref 70–100)
GLUCOSE, POC: 141 mg/dl — ABNORMAL HIGH (ref 70–100)
GLUCOSE, POC: 249 mg/dl — ABNORMAL HIGH (ref 70–100)
GLUCOSE, POC: 222 mg/dl — ABNORMAL HIGH (ref 70–100)

## 2023-03-14 LAB — MICROALBUMIN/CREATININE RATIO, URINE, RANDOM
CREATININE RANDOM URINE: 74 mg/dL (ref 30–125)
MICROALBUMIN RANDOM URINE: 226.1 mg/dL
MICROALBUMIN/CREATININE RATIO RANDOM URINE: 3055.4 mg/g

## 2023-03-14 LAB — MAGNESIUM: MAGNESIUM: 1.9 mg/dL (ref 1.9–2.7)

## 2023-03-14 LAB — PROTEIN/CREATININE RATIO, URINE, RANDOM
CREATININE RANDOM URINE: 74 mg/dL (ref 30–125)
PROTEIN RANDOM URINE: 486 mg/dL — ABNORMAL HIGH (ref 50–80)
PROTEIN/CREATININE RATIO RANDOM URINE: 6.568 mg/mg (ref 0.000–200.000)

## 2023-03-14 LAB — OCCULT BLOOD, STOOL (IFOB): IFOB: POSITIVE — AB

## 2023-03-14 LAB — HGA1C (HEMOGLOBIN A1C WITH EST AVG GLUCOSE): HEMOGLOBIN A1C: 6.1 % — ABNORMAL HIGH (ref 4.0–6.0)

## 2023-03-14 MED ORDER — DEXAMETHASONE SODIUM PHOSPHATE (PF) 10 MG/ML INJECTION SOLUTION
10.0000 mg | Freq: Once | INTRAMUSCULAR | Status: AC
Start: 2023-03-14 — End: 2023-03-14
  Administered 2023-03-14: 10 mg via INTRAVENOUS
  Filled 2023-03-14: qty 1

## 2023-03-14 MED ORDER — HALOPERIDOL LACTATE 5 MG/ML INJECTION SOLUTION
5.0000 mg | Freq: Four times a day (QID) | INTRAMUSCULAR | Status: DC | PRN
Start: 2023-03-14 — End: 2023-03-21
  Administered 2023-03-14 – 2023-03-17 (×3): 5 mg via INTRAVENOUS
  Filled 2023-03-14 (×3): qty 1

## 2023-03-14 MED ORDER — OXYCODONE 5 MG TABLET
5.0000 mg | ORAL_TABLET | ORAL | Status: DC | PRN
Start: 2023-03-14 — End: 2023-03-14
  Administered 2023-03-14 (×2): 5 mg via ORAL
  Filled 2023-03-14: qty 1

## 2023-03-14 MED ORDER — LIDOCAINE HCL 10 MG/ML (1 %) INJECTION SOLUTION
1000.0000 mg | INTRAMUSCULAR | Status: DC
Start: 2023-03-15 — End: 2023-03-14

## 2023-03-14 MED ORDER — COLLAGENASE CLOSTRIDIUM HISTOLYTICUM 250 UNIT/GRAM TOPICAL OINTMENT
TOPICAL_OINTMENT | Freq: Every day | CUTANEOUS | Status: DC
Start: 2023-03-14 — End: 2023-03-21
  Administered 2023-03-14: 0 g via TOPICAL
  Filled 2023-03-14 (×2): qty 30

## 2023-03-14 MED ORDER — FAMOTIDINE (PF) 20 MG/2 ML INTRAVENOUS SOLUTION
10.0000 mg | Freq: Two times a day (BID) | INTRAVENOUS | Status: DC
Start: 2023-03-14 — End: 2023-03-20
  Administered 2023-03-14 – 2023-03-20 (×13): 10 mg via INTRAVENOUS
  Filled 2023-03-14 (×12): qty 2

## 2023-03-14 MED ORDER — BUPRENORPHINE 2 MG-NALOXONE 0.5 MG SUBLINGUAL TABLET
1.0000 | SUBLINGUAL_TABLET | Freq: Once | SUBLINGUAL | Status: AC
Start: 2023-03-14 — End: 2023-03-14
  Administered 2023-03-14: 1 via SUBLINGUAL
  Filled 2023-03-14: qty 1

## 2023-03-14 MED ORDER — ACETAMINOPHEN 325 MG TABLET
650.0000 mg | ORAL_TABLET | ORAL | Status: DC | PRN
Start: 2023-03-14 — End: 2023-03-21

## 2023-03-14 MED ORDER — ETHYL ALCOHOL 62 % (NOZIN NASAL SANITIZER) NASAL SOLUTION - BULK BOTTLE
1.0000 | Freq: Two times a day (BID) | NASAL | Status: DC
Start: 2023-03-14 — End: 2023-03-14

## 2023-03-14 MED ORDER — ALUMINUM-MAG HYDROXIDE-SIMETHICONE 200 MG-200 MG-20 MG/5 ML ORAL SUSP
15.0000 mL | Freq: Four times a day (QID) | ORAL | Status: DC | PRN
Start: 2023-03-14 — End: 2023-03-21

## 2023-03-14 MED ORDER — DIPHENHYDRAMINE 50 MG/ML INJECTION SOLUTION
25.0000 mg | Freq: Four times a day (QID) | INTRAMUSCULAR | Status: DC | PRN
Start: 2023-03-14 — End: 2023-03-21
  Administered 2023-03-14 – 2023-03-15 (×4): 25 mg via INTRAVENOUS
  Filled 2023-03-14 (×4): qty 1

## 2023-03-14 MED ORDER — DEXTROSE 5 % IN WATER (D5W) INTRAVENOUS SOLUTION
INTRAVENOUS | Status: DC
Start: 2023-03-14 — End: 2023-03-15
  Filled 2023-03-14 (×4): qty 150

## 2023-03-14 MED ORDER — ONDANSETRON HCL (PF) 4 MG/2 ML INJECTION SOLUTION
4.0000 mg | Freq: Three times a day (TID) | INTRAMUSCULAR | Status: DC | PRN
Start: 2023-03-14 — End: 2023-03-21

## 2023-03-14 MED ORDER — POLYETHYLENE GLYCOL 3350 17 GRAM ORAL POWDER PACKET
17.0000 g | Freq: Every day | ORAL | Status: DC | PRN
Start: 2023-03-14 — End: 2023-03-21

## 2023-03-14 MED ORDER — ALBUTEROL SULFATE 2.5 MG/3 ML (0.083 %) SOLUTION FOR NEBULIZATION
2.5000 mg | INHALATION_SOLUTION | RESPIRATORY_TRACT | Status: DC | PRN
Start: 2023-03-14 — End: 2023-03-21

## 2023-03-14 MED ORDER — ZIPRASIDONE 20 MG/ML (FINAL CONCENTRATION) INTRAMUSCULAR SOLUTION
10.0000 mg | Freq: Once | INTRAMUSCULAR | Status: AC
Start: 2023-03-14 — End: 2023-03-14
  Administered 2023-03-14: 10 mg via INTRAMUSCULAR
  Filled 2023-03-14: qty 1

## 2023-03-14 MED ORDER — LIDOCAINE HCL 10 MG/ML (1 %) INJECTION SOLUTION
500.0000 mg | INTRAMUSCULAR | Status: DC
Start: 2023-03-15 — End: 2023-03-14

## 2023-03-14 MED ORDER — GUAIFENESIN 100 MG/5 ML ORAL LIQUID
200.0000 mg | ORAL | Status: DC | PRN
Start: 2023-03-14 — End: 2023-03-21

## 2023-03-14 MED ORDER — BUPRENORPHINE 2 MG-NALOXONE 0.5 MG SUBLINGUAL TABLET
1.0000 | SUBLINGUAL_TABLET | Freq: Two times a day (BID) | SUBLINGUAL | Status: DC
Start: 2023-03-14 — End: 2023-03-21
  Administered 2023-03-14 – 2023-03-16 (×3): 0 via SUBLINGUAL
  Administered 2023-03-17 – 2023-03-21 (×8): 1 via SUBLINGUAL
  Filled 2023-03-14 (×9): qty 1

## 2023-03-14 MED ORDER — LORAZEPAM 2 MG/ML INJECTION WRAPPER
0.5000 mg | INTRAMUSCULAR | Status: DC | PRN
Start: 2023-03-14 — End: 2023-03-21
  Administered 2023-03-14 – 2023-03-21 (×8): 0.5 mg via INTRAVENOUS
  Filled 2023-03-14 (×9): qty 1

## 2023-03-14 MED ORDER — TRAZODONE 50 MG TABLET
50.0000 mg | ORAL_TABLET | Freq: Every evening | ORAL | Status: DC | PRN
Start: 2023-03-14 — End: 2023-03-21
  Administered 2023-03-19: 50 mg via ORAL
  Filled 2023-03-14: qty 1

## 2023-03-14 MED ORDER — HYDRALAZINE 20 MG/ML INJECTION SOLUTION
10.0000 mg | INTRAMUSCULAR | Status: DC | PRN
Start: 2023-03-14 — End: 2023-03-21

## 2023-03-14 MED ORDER — ETHYL ALCOHOL 62 % (NOZIN NASAL SANITIZER) NASAL SOLUTION - BULK BOTTLE
1.0000 | Freq: Two times a day (BID) | NASAL | Status: DC
Start: 2023-03-14 — End: 2023-03-21
  Administered 2023-03-14 – 2023-03-21 (×15): 1 via NASAL

## 2023-03-14 MED ORDER — NITROGLYCERIN 0.4 MG SUBLINGUAL TABLET
0.4000 mg | SUBLINGUAL_TABLET | SUBLINGUAL | Status: DC | PRN
Start: 2023-03-14 — End: 2023-03-21

## 2023-03-14 NOTE — Nurses Notes (Signed)
Notified E.Smith NP via secure chat-- accucheck was 50, refused glucagon, had soda, up to 56, 1/2 amp D50W brought it up to 128.  No change in prior orders.

## 2023-03-14 NOTE — Ancillary Notes (Addendum)
Wound Care Consult    Patient: [name] [name]   Date of Service: 03/14/2023  Location: [facility]       Referral :  Question Answer Comment   Reason for Consult: PRESSURE ULCER(S)    Location or Laterality: left heel wound        Admission Diagnosis:AMS (altered mental status)     Braden Scale:17    Past Medical History:   Anxiety      Depression      Diabetes (CMS HCC)      Diabetes mellitus, type 2 (CMS HCC)      GERD (gastroesophageal reflux disease)      MRSA (methicillin resistant staph aureus) culture positive 08/06/2019     L eye cellulitis    Osteomyelitis (CMS HCC)      Peripheral neuropathy      Labs:  See EMR    Assessment:   Pt admitted with AMS (altered mental status)  .    Pt lying in bed in supine position.  Resting quietly with eyes closed.  Responded to verbal stimuli.  Had no complaints at this time.     Family at the bedside.   Required assistance with bed mobility.  Pt stated that wounds were obtained .     Reviewed and gave pt the skin breakdown prevention pamphlet.      Wound #1  Left Heel  Removed old dsg from the wound.      Stage/Type:   Unstageable pressure injury         Length (cm):7.5      Width (cm):  5    Depth (cm): 0.8       Wound Bed:  Slough  Wound Thickness:  Unable to determine  Drainage:  purulent    Drainage amount:   moderate          Wound Edges:   attached   Surrounding tissue:  pink    Undermining/Tunneling:unable to determine  Odor:   none        Recommendations:   Daily dressing changes  -Apply Santyl, cover with Allevyn Gentle ( to absorb drainage) then apply Allevyn heel dressing.     -      Pressure Prevention Recommendations:    -Braden Scale evaluation every shift  -Meticulous hygiene to the involved area; manage moisture/incontinence  -Preventive measures-turn and position patient Q2 hours in bed and Q1 hours in chair (** this is still required with or without use of specialty bed**)  -Frequent repositioning when up in the chair  -Pillow/cushion for pressure redistribution  when up in the chair  -Specialty Bed Standard Low air loss and Alternating pressure  -Head of bed <30 degrees except for meals  -Use border foam dressing to bony prominences  -Offload heels from bed with placement of pillow underneath calves

## 2023-03-14 NOTE — Care Plan (Signed)
Problem: Adult Inpatient Plan of Care  Goal: Plan of Care Review  Outcome: Ongoing (see interventions/notes)  Goal: Patient-Specific Goal (Individualized)  Outcome: Ongoing (see interventions/notes)  Flowsheets (Taken 03/14/2023 2000)  Individualized Care Needs: assist with ADl's, monitor vital signs, prn medication, IV fludis  Anxieties, Fears or Concerns: falling  Patient-Specific Goals (Include Timeframe): discharge when criteria met  Plan of Care Reviewed With: patient  Goal: Absence of Hospital-Acquired Illness or Injury  Outcome: Ongoing (see interventions/notes)  Intervention: Identify and Manage Fall Risk  Recent Flowsheet Documentation  Taken 03/14/2023 2000 by Francesco Runner, RN  Safety Promotion/Fall Prevention:   activity supervised   fall prevention program maintained   nonskid shoes/slippers when out of bed   safety round/check completed  Intervention: Prevent Skin Injury  Recent Flowsheet Documentation  Taken 03/14/2023 2000 by Francesco Runner, RN  Body Position: supine, head elevated  Goal: Optimal Comfort and Wellbeing  Outcome: Ongoing (see interventions/notes)  Intervention: Provide Person-Centered Care  Recent Flowsheet Documentation  Taken 03/14/2023 2000 by Francesco Runner, RN  Trust Relationship/Rapport:   care explained   choices provided   emotional support provided  Goal: Rounds/Family Conference  Outcome: Ongoing (see interventions/notes)     Problem: Health Knowledge, Opportunity to Enhance (Adult,Obstetrics,Pediatric)  Goal: Knowledgeable about Health Subject/Topic  Description: Patient will demonstrate the desired outcomes by discharge/transition of care.  Outcome: Ongoing (see interventions/notes)     Problem: Fall Injury Risk  Goal: Absence of Fall and Fall-Related Injury  Outcome: Ongoing (see interventions/notes)  Intervention: Promote Injury-Free Environment  Recent Flowsheet Documentation  Taken 03/14/2023 2000 by Francesco Runner, RN  Safety Promotion/Fall Prevention:   activity supervised    fall prevention program maintained   nonskid shoes/slippers when out of bed   safety round/check completed     Problem: Skin Injury Risk Increased  Goal: Skin Health and Integrity  Outcome: Ongoing (see interventions/notes)  Intervention: Optimize Skin Protection  Recent Flowsheet Documentation  Taken 03/14/2023 2000 by Francesco Runner, RN  Activity Management: ROM, active encouraged

## 2023-03-14 NOTE — Telemedicine Progress Note (Addendum)
E-Consult Note      [name] [name]  September 21, 1960  62 y.o., male  Date of Service: 03/14/2023    Provider that requested consult:        Reason for Consult: Dr. Leonette Most         Relevant Components of Epic chart/labs/radiology reviewed: yes    BMP:   138 (07/05 0909) 102 (07/05 4782) 38* (07/05 9562)    /     119* (07/05 1308)   2.9* (07/05 6578) 25 (07/05 4696) 4.23* (07/05 0909) \         CBC:     13.9* (07/05 2952) \   11.3* (07/05 8413) /   273 (07/05 2440)      / 35.5* (07/05 1027) \          Recommendations:     AKI on CKD 4  -prerenal   -baseline Cr 2.7  -Scr 4.23* (07/05 0909)   -insert foley. Bladder scan > 999 cc  -IV bicarb drip  -labs in am   -UA was reviewed  -U/S reviewed  -no need for RRT  -TP/CR   -ua/cr   -may need serology w/u     Electrolytes  -replace K.     ACID-BASE:  -acidosis/ bicarb       Time Spent: 5 min       [name], MD

## 2023-03-14 NOTE — OT Evaluation (Signed)
[facility]  [address]  [place], [address]  [phone]  (Fax) [phone]  Rehabilitation Services  Occupational Therapy Inpatient Initial Evaluation      Patient Name: [name] [name]  Date of Birth: 04/03/1961  Height: Height: 180.3 cm (5\' 11" )  Weight: Weight: 90.9 kg (200 lb 4.8 oz)  Room/Bed: 368/A  Payor: [place] MEDICARE / Plan: [place] MEDICARE ADVANTAGE PPO / Product Type: PPO /         PMH:   Past Medical History:   Diagnosis Date    Anxiety     Depression     Diabetes (CMS HCC)     Diabetes mellitus, type 2 (CMS HCC)     GERD (gastroesophageal reflux disease)     MRSA (methicillin resistant staph aureus) culture positive 08/06/2019    L eye cellulitis    Osteomyelitis (CMS HCC)     Peripheral neuropathy            Assessment:      Functional Level at Time of Session: (P) Patient is a pleasant 62 year old male admitted for AMS. During OT evaluation, patient was alert, oriented x2, cooperative and able to follow two step commands. He has limited ROM, strength and coordination in both UEs. Patient required max A-dep in self-feeding due to decreased UE function. Patient had unintelligble speech and was a poor historian. OT to follow up during acute stay for ADL training, conditioning and functional transfer training.    Discharge Needs:   Equipment Recommendation:  TBD    The patient presents with mobility limitations due to impaired balance, impaired range of motion, impaired strength, and impaired functional activity tolerance that significantly impair/prevent patient's ability to participate in mobility-related activities of daily living (MRADLs) including  ambulation and transfers in order to safely complete, toileting, bathing, safely entering/exiting the home, in reasonable time. This functional mobility deficit can be sufficiently resolved with the use of a Anticipated Equipment Needs at Discharge: (P) to be determined  in order to decrease the risk of falls,  morbidity, and mortality in performance of these MRADLs.  Patient is able to safely use this assistive device.    Discharge Disposition:  skilled nursing facility    JUSTIFICATION OF DISCHARGE RECOMMENDATION   Based on current diagnosis, functional performance prior to admission, and current functional performance, this patient requires continued OT services in Anticipated Discharge Disposition: (P) skilled nursing facility  in order to achieve significant functional improvements.    Plan:   Current Intervention:  Predicted Duration of Therapy: (P) until discharge    To provide Occupational therapy services  Therapy Frequency: (P) minimum of 1x/week for duration of Predicted Duration of Therapy: (P) until discharge  .       The risks/benefits of therapy have been discussed with the patient/caregiver and he/she is in agreement with the established plan of care.       Subjective & Objective     MEDICAL HISTORY:   Past Medical History:   Diagnosis Date    Anxiety     Depression     Diabetes (CMS HCC)     Diabetes mellitus, type 2 (CMS HCC)     GERD (gastroesophageal reflux disease)     MRSA (methicillin resistant staph aureus) culture positive 08/06/2019    L eye cellulitis    Osteomyelitis (CMS HCC)     Peripheral neuropathy          SURGICAL HISTORY:   Past Surgical History:  Procedure Laterality Date    MANDIBLE SURGERY      SHOULDER SURGERY         ipp    INSERT FLOW SHEET     03/14/23 1048   Rehab Session   Document Type evaluation   OT Visit Date 03/14/23   Total OT Minutes: 18   Patient Effort fair   General Information   Patient Profile Reviewed yes   Onset of Illness/Injury or Date of Surgery 03/14/23   Medical Lines PIV Line;Telemetry   Respiratory Status room air   Existing Precautions/Restrictions fall precautions   Pre Treatment Status   Pre Treatment Patient Status Patient supine in bed;Call light within reach;Telephone within reach;Patient safety alarm activated;Nurse approved session   Support Present  Pre Treatment  None   Communication Pre Treatment  Charge Nurse   Communication Pre Treatment Comment Cleared for OT   Living Environment   Lives With facility resident  (Resident at [facility])   Functional Level Prior   Ambulation 1 - assistive equipment  (FWW)   Transferring 2 - assistive person   Toileting 0 - independent   Bathing 2 - assistive person   Dressing 2 - assistive person   Eating 0 - independent   Prior Functional Level Comment Patient is pleasantly confused. OT unsure about accuracy of PLOF provided.   Vital Signs   Vitals Comment Pulse ox disconnected   Coping/Psychosocial   Observed Emotional State calm;cooperative   Verbalized Emotional State acceptance   Coping/Psychosocial Response Interventions   Plan Of Care Reviewed With patient   Cognitive Assessment/Interventions   Behavior/Mood Observations alert;cooperative;confused   Orientation Status oriented to;person   Attention mild impairment   Follows Commands follows two step commands   RUE Assessment   RUE Assessment X- Exceptions   RUE ROM Shoulder: Limited movement due to surgery; Elbow, wrist, fingers: full AAROM   RUE Strength 2+/5   RUE Coordination DIrection, rhythm and speed affected  (Shaking)   LUE Assessment   LUE Assessment X-Exceptions   LUE ROM WFL AAROM   LUE Strength 3/5   LUE Coordination Direction, rhythm and speed affected  (Shaking)   LUE Other FIngers lock in flexion and patient has to move them passively with LUE.   Grip Strength   Grip Left (2-/5) poor minus, left   Right Grip (2-/5) poor minus, right   Self-Feeding Assessment/Training   Position  supported sitting  (HOBat 60-90)   SELF FEEDING ASSESSED Self-feeding   ASSISTANCE REQUIRED Open container (set-up);Cutting up food (set-up);Bringing food to mouth   Independence Level maximum assist (25% patient effort)   Comment Limited fine motor skills, coordination and shaking in UEs affecting utensil use   Post Treatment Status   Post Treatment Patient Status Patient supine  in bed;Call light within reach;Telephone within reach;Patient safety alarm activated   Support Present Post Treatment  None   Communication Post Treatement Charge Nurse   Communication Post Treatment Comment Patient would benefit from weighted utensils on food tray.   Care Plan Goals   OT Rehab Goals Bathing Goal;Grooming Goal;Eating/Self Feeding Goal;UB Dressing Goal   Bathing Goal   Bathing Goal, Date Established 03/14/23   Bathing Goal, Time to Achieve by discharge   Bathing Goal, Activity Type all bathing tasks   Bathing Goal, Current Status dependent (less than 25% patient effort)   Bathing Goal, Independence Level maximum assist (25% patient effort)   Eating Self-Feeding Goal   Eat Self Feeding Goal, Date Establised 03/14/23  Eat Self Feeding Goal, Time to Achieve by discharge   Eat Self-Feeding, Activity Type all self-feeding activities   Eat Self Feed Goal, Independence Level moderate assist (50% patient effort)   Eat Self Feeding Goal, Adaptive Equip utensils, large grip   Grooming Goal   Grooming Goal, Date Established 03/14/23   Grooming Goal, Time to Achieve by discharge   Grooming Goal, Activity Type all grooming tasks   Grooming Goal, Current Status maximum assist (25% patient effort)   Grooming Goal, Independence  moderate assist (50% patient effort)   UB Dressing Goal   UB Dressing  Goal, Date Established 03/14/23   UB Dressing Goal, Time to Achieve by discharge   UB Dressing Goal, Activity Type all upper body dressing tasks   UB Dressing Goal, Independence Level maximum assist (25% patient effort)   Planned Therapy Interventions, OT Eval   Planned Therapy Interventions ADL retraining;ROM (range of motion);strengthening   Clinical Impression   Functional Level at Time of Session Patient is a pleasant 62 year old male admitted for AMS. During OT evaluation, patient was alert, oriented x2, cooperative and able to follow two step commands. He has limited ROM, strength and coordination in both UEs.  Patient required max A-dep in self-feeding due to decreased UE function. Patient had unintelligble speech and was a poor historian. OT to follow up during acute stay for ADL training, conditioning and functional transfer training.   Criteria for Skilled Therapeutic Interventions Met (OT) yes;meets criteria;skilled treatment is necessary   Rehab Potential limited   Therapy Frequency minimum of 1x/week   Predicted Duration of Therapy until discharge   Anticipated Equipment Needs at Discharge to be determined   Anticipated Discharge Disposition skilled nursing facility   Evaluation Complexity Justification   Occupational Profile Review Brief history   Performance Deficits 5+ deficits   Clinical Decision Making Moderate analytic complexity   Evaluation Complexity Moderate       TREATMENT PLAN: THERAPEUTIC ACTIVITIES and ADL/IADL TRAINING  EVALUATION COMPLEXITY: CLINICAL DECISION MAKING OF MODERATE COMPLEXITY AS INDICATED BY PMH, OCCUPATIONAL THERAPY ASSESSMENT OF MUSCULOSKELETAL AND NEUROLOGICAL SYSTEMS AND ACTIVITY LIMITATIONS. CLINICAL PRESENTATION HAS CHANGING CHARACTERISTICS.      EVALUATION 18 minutes    Therapist:      Hildred Laser, OT,03/14/2023 11:23

## 2023-03-14 NOTE — Consults (Addendum)
Psychiatric Consultation    Patient's Full Name: [name] [name]   Patient's Date of Birth: Feb 10, 1961   Patient's Age: 62 y.o.   Patient's Legal Sex: male   Patient's MRN: [id number]   Patient's Date of Admission: 03/12/2023   Current Date: 03/14/2023 15:09     Patient's Room/Bed: 368/A       Chief Complaint:  *patient unable to respond at this time*    History of Present Illness:  Patient came to the hospital from a nursing home with recent fall and altered mental status. Patient was found to have slurred speech. Patient's son stated this was not his baseline. In the ED staff found 3 Suboxone strips in his bed that he was not supposed to have. Patient is currently in his bed and appears confused, disoriented, restless, and moving around in his bed. Patient not able to answer questions due to level of altered mental state.     Psychiatric History:    Past Diagnoses: chart notes depression and anxiety     Treatment History:   History of Inpatient Psychiatric Treatment? Unable to obtain due to patient's current mental state  History of Outpatient Psychiatric Treatment? Unable to obtain due to patient's current mental state      Substance Abuse History:    Alcohol Use: Unable to obtain due to patient's current mental state    Drug Use: Unable to obtain due to patient's current mental state; pateint's urine drug screen negative; patients urine was positive for buprenophene, it is listed as a home medication 3x a day at his nursing home     Tobacco Use: Unable to obtain due to patient's current mental state        Medications and Allergies:    Allergies:  Allergies   Allergen Reactions    Penicillins     Tramadol       (I.e. ACE Inhibitors, NSAIDs, ASA, Fish/Seafood, Latex, Nuts, Penicillin, Sulfa)    Current Psychiatric Medications: chart has listed Suboxone tid     Social History:    Living Situation: chart notes patient lives at a nursing home     Marital Status: Unable to obtain due to patient's current mental  state    Highest Level of Education: Unable to obtain due to patient's current mental state    State of Employment: Unable to obtain due to patient's current mental state      Vital Signs:    Filed Vitals:    03/14/23 0800 03/14/23 0940 03/14/23 1001 03/14/23 1202   BP: 134/88   (!) 124/102   Pulse: 86 86  75   Resp: 16  16 18    Temp: 36.6 C (97.8 F)   37.7 C (99.9 F)   SpO2: 97%   98%        Labs:    Results for orders placed or performed during the hospital encounter of 03/12/23 (from the past 24 hour(s))   OCCULT BLOOD, STOOL (IFOB)    Specimen: Stool   Result Value Ref Range    IFOB Positive (A) Negative   CBC/DIFF    Narrative    The following orders were created for panel order CBC/DIFF.  Procedure                               Abnormality         Status                     ---------                               -----------         ------  CBC WITH DIFF[[id number]]                Abnormal            Final result                 Please view results for these tests on the individual orders.   MAGNESIUM   Result Value Ref Range    MAGNESIUM 1.9 1.9 - 2.7 mg/dL   CBC WITH DIFF   Result Value Ref Range    WBC 13.9 (H) 3.6 - 10.2 x10^3/uL    RBC 4.29 4.06 - 5.63 x10^6/uL    HGB 11.3 (L) 12.5 - 16.3 g/dL    HCT 14.7 (L) 82.9 - 47.1 %    MCV 82.9 73.0 - 96.2 fL    MCH 26.3 23.8 - 33.4 pg    MCHC 31.7 (L) 32.5 - 36.3 g/dL    RDW 56.2 (H) 13.0 - 16.2 %    PLATELETS 273 140 - 440 x10^3/uL    MPV 8.5 7.4 - 11.4 fL    NEUTROPHIL % 82 (H) 43 - 77 %    LYMPHOCYTE % 9 (L) 16 - 44 %    MONOCYTE % 6 5 - 13 %    EOSINOPHIL % 2 %    BASOPHIL % 1 0 - 1 %    NEUTROPHIL # 11.40 (H) 1.85 - 7.80 x10^3/uL    LYMPHOCYTE # 1.30 1.00 - 3.00 x10^3/uL    MONOCYTE # 0.80 0.30 - 1.00 x10^3/uL    EOSINOPHIL # 0.20 0.00 - 0.50 x10^3/uL    BASOPHIL # 0.20 (H) 0.00 - 0.10 x10^3/uL   COMPREHENSIVE METABOLIC PANEL, NON-FASTING   Result Value Ref Range    SODIUM 138 136 - 145 mmol/L    POTASSIUM 2.9 (L) 3.5 - 5.1 mmol/L     CHLORIDE 102 98 - 107 mmol/L    CO2 TOTAL 25 21 - 31 mmol/L    ANION GAP 11 4 - 13 mmol/L    BUN 38 (H) 7 - 25 mg/dL    CREATININE 8.65 (H) 0.60 - 1.30 mg/dL    BUN/CREA RATIO 9 6 - 22    ESTIMATED GFR 15 (L) >59 mL/min/1.83m^2    ALBUMIN 3.0 (L) 3.5 - 5.7 g/dL    CALCIUM 8.0 (L) 8.6 - 10.3 mg/dL    GLUCOSE 784 (H) 74 - 109 mg/dL    ALKALINE PHOSPHATASE 66 34 - 104 U/L    ALT (SGPT) <7 (L) 7 - 52 U/L    AST (SGOT) 13 13 - 39 U/L    BILIRUBIN TOTAL 0.6 0.3 - 1.2 mg/dL    PROTEIN TOTAL 7.0 6.4 - 8.9 g/dL    ALBUMIN/GLOBULIN RATIO 0.8 0.8 - 1.4    OSMOLALITY, CALCULATED 286 270 - 290 mOsm/kg    CALCIUM, CORRECTED 8.8 (L) 8.9 - 10.8 mg/dL    GLOBULIN 4.0 2.9 - 5.4    Narrative    Estimated Glomerular Filtration Rate (eGFR) is calculated using the CKD-EPI (2021) equation, intended for patients 59 years of age and older. If gender is not documented or "unknown", there will be no eGFR calculation.     HGA1C (HEMOGLOBIN A1C WITH EST AVG GLUCOSE)   Result Value Ref Range    HEMOGLOBIN A1C 6.1 (H) 4.0 - 6.0 %   PROTEIN/CREATININE RATIO, URINE, RANDOM   Result Value Ref Range    PROTEIN RANDOM URINE 486 (H) 50 - 80 mg/dL    CREATININE RANDOM URINE  74 30 - 125 mg/dL    PROTEIN/CREATININE RATIO RANDOM URINE 6.568 0.000 - 200.000 mg/mg   MICROALBUMIN/CREATININE RATIO, URINE, RANDOM   Result Value Ref Range    CREATININE RANDOM URINE 74 30 - 125 mg/dL    MICROALBUMIN RANDOM URINE 226.1 mg/dL    MICROALBUMIN/CREATININE RATIO RANDOM URINE 3,055.4 mg/g   POC BLOOD GLUCOSE (RESULTS)   Result Value Ref Range    GLUCOSE, POC 96 70 - 100 mg/dl   POC BLOOD GLUCOSE (RESULTS)   Result Value Ref Range    GLUCOSE, POC 90 70 - 100 mg/dl   POC BLOOD GLUCOSE (RESULTS)   Result Value Ref Range    GLUCOSE, POC 50 (L) 70 - 100 mg/dl   POC BLOOD GLUCOSE (RESULTS)   Result Value Ref Range    GLUCOSE, POC 56 (L) 70 - 100 mg/dl   POC BLOOD GLUCOSE (RESULTS)   Result Value Ref Range    GLUCOSE, POC 128 (H) 70 - 100 mg/dl   POC BLOOD GLUCOSE (RESULTS)    Result Value Ref Range    GLUCOSE, POC 141 (H) 70 - 100 mg/dl        Physical Exam:     Please see dictated hospitalist note    Current Facility-Administered Medications   Medication Dose Route Frequency    acetaminophen (TYLENOL) tablet  650 mg Oral Q4H PRN    albuterol (PROVENTIL) 2.5 mg / 3 mL (0.083%) neb solution  2.5 mg Nebulization Q4H PRN    alcohol 62 % (NOZIN NASAL SANITIZER) nasal solution  1 Each Each Nostril 2x/day    aluminum-magnesium hydroxide-simethicone (MAG-AL PLUS) 200-200-20 mg per 5 mL oral liquid  15 mL Oral 4x/day PRN    aspirin tablet 325 mg  325 mg Oral Daily    [Held by provider] buprenorphine-naloxone (SUBOXONE) 2-0.5mg  per sublingual tablet  1 Tablet Sublingual 2x/day    Correction/SSIP insulin lispro (HumaLOG) 100 units/mL injection  0-18 Units Subcutaneous 4x/day AC    D5W 1,000 mL with sodium bicarbonate 150 mEq infusion   Intravenous Continuous    dextrose (GLUTOSE) 40% oral gel  15 g Oral Q15 Min PRN    dextrose 50% (0.5 g/mL) injection - syringe  12.5 g Intravenous Q15 Min PRN    diphenhydrAMINE (BENADRYL) 50 mg/mL injection  25 mg Intravenous Q6H PRN    docusate sodium (COLACE) capsule  100 mg Oral 2x/day    famotidine (PEPCID) 10 mg/mL injection  10 mg Intravenous 2x/day    glucagon (GLUCAGEN DIAGNOSTIC KIT) injection 1 mg  1 mg IntraMUSCULAR Once PRN    guaiFENesin 100mg  per 5mL oral liquid - for cough (expectorant)  200 mg Oral Q4H PRN    heparin 5,000 unit/mL injection  5,000 Units Subcutaneous Q12H    hydrALAZINE (APRESOLINE) injection 10 mg  10 mg Intravenous Q4H PRN    LORazepam (ATIVAN) 2 mg/mL injection  0.5 mg Intravenous Q4H PRN    nitroGLYCERIN (NITROSTAT) sublingual tablet  0.4 mg Sublingual Q5 Min PRN    ondansetron (ZOFRAN) 2 mg/mL injection  4 mg Intravenous Q8H PRN    polyethylene glycol (MIRALAX) oral packet  17 g Oral Daily PRN    potassium chloride (K-DUR) extended release tablet  20 mEq Oral 2x/day-Food    traZODone (DESYREL) tablet  50 mg Oral HS PRN         Mental Status Examination:    Unable to obtain due to patient's current mental state      Discussed with family/guardian/significant other:  Clinical Conclusion:    Overall Impression: Patient presents to the hospital with altered mental status and falls at the hospital. Patient currently appears delirious, appears more of a hyperactive type. Patient with multiple medical issues that are contributing including hypokalemia, infection in his left food, urinary retention, leukocytosis, AKI, staff also noted possible angioedema of unknown origin. CT of head was negative for anything acute. There is some concern that possibly the steroid given this morning, Dexamethasone, could have caused worsening agitation, aggression, altered mental state, and mood changes, as these are common side effects of the steroid.     Diagnoses:   Delirium     Treatment and Medication Recommendation:  -continue to treat underlying medical issues  -recommend attempting to avoid if and when possible steroids, benzodiazepines, and anticholinergic medications as they call can worsen delirium   -delirium precautions  -start back patient's Suboxone as soon as patient able to tolerate and alert to avoid withdrawal complications  -consider MRI of the head as may be more sensitive than Head CT if there is ongoing concerns for intracranial process   -consider 1:1 sitter due to patient's risk of falls and altered state, inability to follow commands   -use least restriction as possible, but restraints as need to avoid patient hurting himself or others   -will order haldol 5mg  IV q6hrs prn severe agitation   -consider ammonia level, RPR, HIV, COVID testing     Portions of this evaluation were not assessed due to the following:    Altered mental status         Interaction Attestation: Clinical telemedicine services delivered using HIPAA-compliant interactive video-audio telecommunications while the patient and the rendering provider were not in the  same physical location. Patient agreeable to telecommunication    TELEMEDICINE DOCUMENTATION:    Patient Location:  [facility], [address] [place], [place] [address]   Provider Location: Remote  Patient/family aware of provider location:  yes  Patient/family consent for telemedicine:  yes  Examination observed and performed by:  Claudette Laws, DO    Emi Belfast Barry Dienes, DO  Psychiatrist  Medical Director, [facility]

## 2023-03-14 NOTE — Nurses Notes (Signed)
Dr. Leonette Most in to see patient at this time. Patient restlessness has returned, pulling at the air with his hands and arms. Stated he was putting orders in for EKG, and psych consult. Primary nurse at bedside to give scheduled geodon at this time as well.

## 2023-03-14 NOTE — Nurses Notes (Signed)
Pt screaming in room for "you fucking bitch" to "get the hell out".  Repeatedly yelling and cursing.  Upon entering room, only pt is in it.  When asked who he is talking to, pointed to wall and yelled "her, she is stabbing something into my foot".  Attempts to explain no one is present not accepted by pt and he began yelling at nursing staff to get out as well.  He did cease yelling when staff exited.

## 2023-03-14 NOTE — Nurses Notes (Signed)
Patient bladder scan >972mL, foley catheter inserted per orders from Dr. Deatra Robinson. Dr. Leonette Most notified of updates. Also notified that patient is extremely restless and has jerky movements similar to tardive dyskinesia. PRN ativan and benadryl given with positive response to medication. Patient resting calmly in bed with eyes closed.

## 2023-03-14 NOTE — Progress Notes (Addendum)
[place] MEDICINE [facility]     HOSPITALIST PROGRESS NOTE    Assessment/Plan:    [name] [name] is a 62 y.o. male who presented to [facility] with AMS (altered mental status).    Concern for angioedema  Tongue appears significantly swollen with compromise speech   Airway remains patent and he does not require supplemental oxygen  Chest is clear to auscultation bilaterally  VSS  Unknown etiology/offending agent  Will treat with Decadron and Pepcid  Monitor for improvement    Acute metabolic encephalopathy, resolved   Auditory and visual hallucination, resolved   Multifactorial:  Uremia, narcotics  Mentation is now back to baseline       Acute renal failure   Probably baseline CKD Stage 3  Metabolic acidemia  Multifactorial:  Prerenal Azotemia, adverse effects of meds   Start bicarb drip   Renal ultrasound WNL  Avoid all nephrotoxins  Nephrology consult greatly appreciated    Opiate use disorder   On Suboxone 3 times daily  Dose appears to be too high   Will hold Suboxone for now until mentation improves  Check UDS    Diabetes with hyperglycemia & Hypoglycemia  Check A1c  Continue insulin therapy      Code Status: Full Code    VTE prophylaxis: HSQ    Disposition: TBD       LOS: 0 days     Subjective     Patient seen and examined at bedside.   Patient was awake, alert and oriented x3   His mentation has returned to baseline   Unfortunately, patient does have a very swollen tongue   Concern for angioedema   Airways patent, chest CTABL, NAD  Treated with decadron and Pepcid  reassess    Objective     Vital signs in last 24 hours:  Filed Vitals:    03/14/23 0330 03/14/23 0459 03/14/23 0800 03/14/23 1001   BP: 136/82  134/88    Pulse: 83  86    Resp: 18 18 16 16    Temp: 36.3 C (97.3 F)  36.6 C (97.8 F)    SpO2: 96%  97%        Intake/Output last 3 shifts:    Intake/Output Summary (Last 24 hours) at 03/14/2023 1031  Last data filed at 03/14/2023 1610  Gross per 24 hour   Intake 685 ml   Output --   Net 685  ml        Physical Exam:    General: Well-developed, well-nourished.  Pleasantly confused  Cardiovascular:  Regular rate, normal rhythm, S1 and S2 present, no murmur, no gallop, no rub, normal peripheral perfusion  Respiratory: Clear to auscultation bilaterally, bilateral breath sounds, normal effort  Gastrointestinal: Soft, benign, nondistended, nontender, no mass  Extremities: No gross deformity, no cyanosis or edema  Skin: Warm, dry; no rashes or lesions    In-Hospital Medications:    Current Facility-Administered Medications   Medication Dose Route Frequency    aspirin tablet 325 mg  325 mg Oral Daily    [Held by provider] buprenorphine-naloxone (SUBOXONE) 2-0.5mg  per sublingual tablet  1 Tablet Sublingual 3x/day    Correction/SSIP insulin lispro (HumaLOG) 100 units/mL injection  0-18 Units Subcutaneous 4x/day AC    D5W 1,000 mL with sodium bicarbonate 150 mEq infusion   Intravenous Continuous    dextrose (GLUTOSE) 40% oral gel  15 g Oral Q15 Min PRN    dextrose 50% (0.5 g/mL) injection - syringe  12.5 g Intravenous Q15 Min PRN  docusate sodium (COLACE) capsule  100 mg Oral 2x/day    famotidine (PEPCID) 10 mg/mL injection  10 mg Intravenous 2x/day    glucagon (GLUCAGEN DIAGNOSTIC KIT) injection 1 mg  1 mg IntraMUSCULAR Once PRN    heparin 5,000 unit/mL injection  5,000 Units Subcutaneous Q12H    oxyCODONE (ROXICODONE) immediate release tablet  5 mg Oral Q4H PRN    potassium chloride (K-DUR) extended release tablet  20 mEq Oral 2x/day-Food          Labs:    Results for orders placed or performed during the hospital encounter of 03/12/23 (from the past 24 hour(s))   URINE DRUG SCREEN    Collection Time: 03/12/23  1:22 PM   Result Value Ref Range    AMPHETAMINES URINE Negative Negative    BARBITURATES URINE Negative Negative    BENZODIAZEPINES URINE Negative Negative    METHADONE URINE Negative Negative    COCAINE METABOLITES URINE Negative Negative    OPIATES URINE Negative Negative    PCP URINE Negative  Negative    CANNABINOIDS URINE Negative Negative   URINALYSIS, MACRO/MICRO    Collection Time: 03/12/23  1:22 PM   Result Value Ref Range    COLOR Light Yellow (A) Yellow    APPEARANCE Slightly Hazy (A) Clear    SPECIFIC GRAVITY 1.025 1.003 - 1.035    PH 6.0 4.6 - 8.0    LEUKOCYTES Negative Negative WBCs/uL    NITRITE Negative Negative    PROTEIN >= 300 (A) Negative mg/dL    GLUCOSE 161 (A) Negative mg/dL    KETONES Trace (A) Negative mg/dL    BILIRUBIN Negative Negative mg/dL    BLOOD Moderate (A) Negative mg/dL    UROBILINOGEN 0.2 0.2 - 1.0 mg/dL   URINALYSIS, MICROSCOPIC    Collection Time: 03/12/23  1:22 PM   Result Value Ref Range    RBCS 11-15 (A) 0-3, Not Present /hpf    BACTERIA Few (A) Negative /hpf    MUCOUS Few (A) (none) /hpf    WBCS 0-5 Not Present, Occasional, 0-5 /hpf    SQUAMOUS EPITHELIAL Few Not Present, Few /hpf   COMPREHENSIVE METABOLIC PANEL, NON-FASTING    Collection Time: 03/12/23  1:43 PM   Result Value Ref Range    SODIUM 139 136 - 145 mmol/L    POTASSIUM 3.2 (L) 3.5 - 5.1 mmol/L    CHLORIDE 103 98 - 107 mmol/L    CO2 TOTAL 22 21 - 32 mmol/L    ANION GAP 14 (H) 4 - 13 mmol/L    BUN 41 (H) 7 - 18 mg/dL    CREATININE 0.96 (H) 0.70 - 1.30 mg/dL    BUN/CREA RATIO 9     ESTIMATED GFR 13 (L) >59 mL/min/1.59m^2    ALBUMIN 2.3 (L) 3.4 - 5.0 g/dL    CALCIUM 8.4 (L) 8.5 - 10.1 mg/dL    GLUCOSE 90 74 - 045 mg/dL    ALKALINE PHOSPHATASE 86 46 - 116 U/L    ALT (SGPT) 12 <=78 U/L    AST (SGOT) 13 (L) 15 - 37 U/L    BILIRUBIN TOTAL 0.5 0.2 - 1.0 mg/dL    PROTEIN TOTAL 7.6 6.4 - 8.2 g/dL    ALBUMIN/GLOBULIN RATIO 0.4 (L) 0.8 - 1.4    OSMOLALITY, CALCULATED 287 270 - 290 mOsm/kg    CALCIUM, CORRECTED 9.8 mg/dL    GLOBULIN 5.3    CBC WITH DIFF    Collection Time: 03/12/23  1:43 PM   Result Value  Ref Range    WBC 18.7 (H) 4.0 - 10.5 x10^3/uL    RBC 4.31 4.20 - 6.00 x10^6/uL    HGB 11.8 (L) 13.5 - 18.0 g/dL    HCT 54.0 (L) 98.1 - 51.0 %    MCV 86.2 78.0 - 99.0 fL    MCH 27.5 27.0 - 32.0 pg    MCHC 31.8 (L) 32.0  - 36.0 g/dL    RDW 19.1 (H) 47.8 - 14.8 %    PLATELETS 349 140 - 440 x10^3/uL    MPV 8.1 7.4 - 10.4 fL    NEUTROPHIL % 89 (H) 40 - 76 %    LYMPHOCYTE % 6 (L) 25 - 45 %    MONOCYTE % 5 0 - 12 %    EOSINOPHIL % 1 0 - 7 %    BASOPHIL % 0 0 - 3 %    NEUTROPHIL # 16.56 (H) 1.80 - 8.40 x10^3/uL    LYMPHOCYTE # 1.08 (L) 1.10 - 5.00 x10^3/uL    MONOCYTE # 0.91 0.00 - 1.30 x10^3/uL    EOSINOPHIL # 0.13 0.00 - 0.80 x10^3/uL    BASOPHIL # 0.02 0.00 - 0.30 x10^3/uL   CREATINE KINASE (CK), TOTAL, SERUM OR PLASMA    Collection Time: 03/12/23  2:22 PM   Result Value Ref Range    CREATINE KINASE 92 39 - 308 U/L   AMMONIA    Collection Time: 03/12/23  2:31 PM   Result Value Ref Range    AMMONIA <10 (L) 11 - 32 umol/L   GOLD TOP TUBE    Collection Time: 03/12/23  2:41 PM   Result Value Ref Range    RAINBOW/EXTRA TUBE AUTO RESULT Yes    GRAY TOP TUBE    Collection Time: 03/12/23  2:41 PM   Result Value Ref Range    RAINBOW/EXTRA TUBE AUTO RESULT Yes    ECG 12 LEAD    Collection Time: 03/12/23  3:09 PM   Result Value Ref Range    Ventricular rate 87 BPM    Atrial Rate 87 BPM    PR Interval 160 ms    QRS Duration 108 ms    QT Interval 398 ms    QTC Calculation 478 ms    Calculated P Axis 5 degrees    Calculated R Axis -65 degrees    Calculated T Axis 24 degrees   BASIC METABOLIC PANEL, NON-FASTING    Collection Time: 03/13/23  4:31 AM   Result Value Ref Range    SODIUM 136 136 - 145 mmol/L    POTASSIUM 3.1 (L) 3.5 - 5.1 mmol/L    CHLORIDE 106 98 - 107 mmol/L    CO2 TOTAL 17 (L) 21 - 31 mmol/L    ANION GAP 13 4 - 13 mmol/L    CALCIUM 8.2 (L) 8.6 - 10.3 mg/dL    GLUCOSE 63 (L) 74 - 109 mg/dL    BUN 40 (H) 7 - 25 mg/dL    CREATININE 2.95 (H) 0.60 - 1.30 mg/dL    BUN/CREA RATIO 10 6 - 22    ESTIMATED GFR 15 (L) >59 mL/min/1.48m^2    OSMOLALITY, CALCULATED 280 270 - 290 mOsm/kg   MAGNESIUM    Collection Time: 03/13/23  4:31 AM   Result Value Ref Range    MAGNESIUM 1.9 1.9 - 2.7 mg/dL   CBC WITH DIFF    Collection Time: 03/13/23  4:31 AM    Result Value Ref Range    WBC 16.7 (H)  3.6 - 10.2 x10^3/uL    RBC 4.14 4.06 - 5.63 x10^6/uL    HGB 11.1 (L) 12.5 - 16.3 g/dL    HCT 16.1 (L) 09.6 - 47.1 %    MCV 83.9 73.0 - 96.2 fL    MCH 26.8 23.8 - 33.4 pg    MCHC 31.9 (L) 32.5 - 36.3 g/dL    RDW 04.5 (H) 40.9 - 16.2 %    PLATELETS 289 140 - 440 x10^3/uL    MPV 8.8 7.4 - 11.4 fL    NEUTROPHIL % 86 (H) 43 - 77 %    LYMPHOCYTE % 7 (L) 16 - 44 %    MONOCYTE % 5 5 - 13 %    EOSINOPHIL % 2 %    BASOPHIL % 1 0 - 1 %    NEUTROPHIL # 14.30 (H) 1.85 - 7.80 x10^3/uL    LYMPHOCYTE # 1.10 1.00 - 3.00 x10^3/uL    MONOCYTE # 0.90 0.30 - 1.00 x10^3/uL    EOSINOPHIL # 0.30 0.00 - 0.50 x10^3/uL    BASOPHIL # 0.10 0.00 - 0.10 x10^3/uL   RETICULOCYTE COUNT    Collection Time: 03/13/23  4:31 AM   Result Value Ref Range    RETICULOCYTE % AUTOMATED 1.07 0.5 - 2.17 %    IMMATURE RETIC FRACTION 0.32 0.29 - 0.53    RETICULOCYTES COUNT # AUTOMATED 0.0443 0.0221 - 0.0963 10(6)/uL   FERRITIN    Collection Time: 03/13/23  4:31 AM   Result Value Ref Range    FERRITIN 86 11 - 336 ng/mL   VITAMIN B12    Collection Time: 03/13/23  4:31 AM   Result Value Ref Range    VITAMIN B 12 221 180 - 914 pg/mL   FOLATE    Collection Time: 03/13/23  4:31 AM   Result Value Ref Range    FOLATE 18.2 5.9 - 24.4 ng/mL   IRON TRANSFERRIN AND TIBC    Collection Time: 03/13/23  4:31 AM   Result Value Ref Range    TOTAL IRON BINDING CAPACITY 190 (L) 250 - 450 ug/dL    IRON (TRANSFERRIN) SATURATION 13 (L) 20 - 50 %    IRON 25 (L) 50 - 212 ug/dL    TRANSFERRIN 811 (L) 203 - 362 mg/dL    UIBC 914 782 - 956 ug/dL   POC BLOOD GLUCOSE (RESULTS)    Collection Time: 03/13/23  5:55 AM   Result Value Ref Range    GLUCOSE, POC 98 70 - 100 mg/dl       Micro:    Hospital Encounter on 03/12/23 (from the past 24 hour(s))   OCCULT BLOOD, STOOL (IFOB)    Collection Time: 03/14/23  9:26 AM    Specimen: Stool   Culture Result Status    IFOB Positive (A) Final        Imaging:    US KIDNEY WITH BLADDER  Narrative: [name]    RADIOLOGIST: Mickey Farber, MD    US KIDNEY WITH BLADDER performed on 03/13/2023 10:56 AM    CLINICAL HISTORY: worsening renal function.  WORSENING RENAL FUNCTION    TECHNIQUE: Ultrasound of the kidneys and bladder    COMPARISON:  None.    FINDINGS:  Kidneys:       The right kidney measures 12.9 by 6.1 x 6.8 cm       The left kidney measures 12.6 by 6.2 x 7.0 cm    The kidneys are of normal sizes and echotextures.  Renal  parenchymal thicknesses are grossly preserved.   No hydronephrosis.    No cysts or large solid renal masses.    Bladder:        Prevoid volume: 213.8 ml.       Postvoid volume: The patient was unable to void.  Normally distended with a grossly normal contour.  No large bladder wall mass is identified.  Impression: Negative for hydronephrosis.    Radiologist location ID: [id number]         Audrea Muscat, MD  March 14, 2023    I personally spent 50 minutes face-to-face and non-face-to-face in the care of this patient, which includes all pre, intra, and post visit time on the date of service.  All documented time was specific to the E/M visit and does not include any procedures that may have been performed.

## 2023-03-14 NOTE — PT Evaluation (Signed)
[facility] Medicine [facility]  [address]  [place], [address]  [phone]  (Fax) [phone]  Rehabilitation Services  Physical Therapy     Patient Name: [name] [name]  Date of Birth: 03-13-1961  Height: Height: 180.3 cm (5\' 11" )  Weight: Weight: 90.9 kg (200 lb 4.8 oz)  Room/Bed: 368/A  Payor: [place] MEDICARE / Plan: [place] MEDICARE ADVANTAGE PPO / Product Type: PPO /     PATIENT DID NOT PARTICIPATE IN THERAPY TODAY DUE TO: PATIENT DECLINED TREATMENT        Phineas Douglas, PT 03/14/2023,10:26

## 2023-03-15 LAB — CBC WITH DIFF
BASOPHIL #: 0.1 10*3/uL (ref 0.00–0.10)
BASOPHIL %: 1 % (ref 0–1)
EOSINOPHIL #: 0 10*3/uL (ref 0.00–0.50)
EOSINOPHIL %: 0 % — ABNORMAL LOW
HCT: 35.4 % — ABNORMAL LOW (ref 36.7–47.1)
HGB: 11.3 g/dL — ABNORMAL LOW (ref 12.5–16.3)
LYMPHOCYTE #: 0.7 10*3/uL — ABNORMAL LOW (ref 1.00–3.00)
LYMPHOCYTE %: 4 % — ABNORMAL LOW (ref 16–44)
MCH: 26.6 pg (ref 23.8–33.4)
MCHC: 31.8 g/dL — ABNORMAL LOW (ref 32.5–36.3)
MCV: 83.8 fL (ref 73.0–96.2)
MONOCYTE #: 0.9 10*3/uL (ref 0.30–1.00)
MONOCYTE %: 6 % (ref 5–13)
MPV: 9.1 fL (ref 7.4–11.4)
NEUTROPHIL #: 15.4 10*3/uL — ABNORMAL HIGH (ref 1.85–7.80)
NEUTROPHIL %: 90 % — ABNORMAL HIGH (ref 43–77)
PLATELETS: 276 10*3/uL (ref 140–440)
RBC: 4.23 10*6/uL (ref 4.06–5.63)
RDW: 17.3 % — ABNORMAL HIGH (ref 12.1–16.2)
WBC: 17.2 10*3/uL — ABNORMAL HIGH (ref 3.6–10.2)

## 2023-03-15 LAB — POC BLOOD GLUCOSE (RESULTS)
GLUCOSE, POC: 212 mg/dl — ABNORMAL HIGH (ref 70–100)
GLUCOSE, POC: 183 mg/dl — ABNORMAL HIGH (ref 70–100)
GLUCOSE, POC: 152 mg/dl — ABNORMAL HIGH (ref 70–100)
GLUCOSE, POC: 190 mg/dl — ABNORMAL HIGH (ref 70–100)

## 2023-03-15 LAB — COMPREHENSIVE METABOLIC PANEL, NON-FASTING
ALBUMIN/GLOBULIN RATIO: 0.8 (ref 0.8–1.4)
ALBUMIN: 3.2 g/dL — ABNORMAL LOW (ref 3.5–5.7)
ALKALINE PHOSPHATASE: 69 U/L (ref 34–104)
ALT (SGPT): <7 U/L — ABNORMAL LOW (ref 7–52)
ANION GAP: 15 mmol/L — ABNORMAL HIGH (ref 4–13)
AST (SGOT): 12 U/L — ABNORMAL LOW (ref 13–39)
BILIRUBIN TOTAL: 0.5 mg/dL (ref 0.3–1.2)
BUN/CREA RATIO: 10 (ref 6–22)
BUN: 40 mg/dL — ABNORMAL HIGH (ref 7–25)
CALCIUM, CORRECTED: 8.6 mg/dL — ABNORMAL LOW (ref 8.9–10.8)
CALCIUM: 8 mg/dL — ABNORMAL LOW (ref 8.6–10.3)
CHLORIDE: 101 mmol/L (ref 98–107)
CO2 TOTAL: 27 mmol/L (ref 21–31)
CREATININE: 3.97 mg/dL — ABNORMAL HIGH (ref 0.60–1.30)
ESTIMATED GFR: 16 mL/min/{1.73_m2} — ABNORMAL LOW (ref >59)
GLOBULIN: 4.1 (ref 2.9–5.4)
GLUCOSE: 209 mg/dL — ABNORMAL HIGH (ref 74–109)
OSMOLALITY, CALCULATED: 301 mOsm/kg — ABNORMAL HIGH (ref 270–290)
POTASSIUM: 3.2 mmol/L — ABNORMAL LOW (ref 3.5–5.1)
PROTEIN TOTAL: 7.3 g/dL (ref 6.4–8.9)
SODIUM: 143 mmol/L (ref 136–145)

## 2023-03-15 LAB — DRUG SCREEN, NO CONFIRMATION, URINE
AMPHET QL: NEGATIVE
BARB QL: NEGATIVE
BENZO QL: NEGATIVE
BUP QL: POSITIVE — AB
CANNAQL: NEGATIVE
COCQL: NEGATIVE
FENTANYL, RANDOM URINE: NEGATIVE
METHQL: NEGATIVE
OPIATE: NEGATIVE
OXYCODONE URINE: POSITIVE — AB
PCP QL: NEGATIVE

## 2023-03-15 LAB — MAGNESIUM: MAGNESIUM: 2 mg/dL (ref 1.9–2.7)

## 2023-03-15 MED ORDER — LACTATED RINGERS INTRAVENOUS SOLUTION
INTRAVENOUS | Status: DC
Start: 2023-03-15 — End: 2023-03-17
  Administered 2023-03-17: 0 mL via INTRAVENOUS

## 2023-03-15 MED ORDER — TAMSULOSIN 0.4 MG CAPSULE
0.4000 mg | ORAL_CAPSULE | Freq: Every evening | ORAL | Status: DC
Start: 2023-03-15 — End: 2023-03-21
  Administered 2023-03-15 – 2023-03-20 (×6): 0.4 mg via ORAL
  Filled 2023-03-15 (×6): qty 1

## 2023-03-15 NOTE — PT Evaluation (Signed)
.. [facility] Medicine [facility]  [address]  [place], [address]  [phone]  (Fax) [phone]  Rehabilitation Services  Physical Therapy Inpatient Initial Evaluation    Patient Name: [name] [name]  Date of Birth: 1961/07/15  Height: Height: 180.3 cm (5\' 11" )  Weight: Weight: 93.5 kg (206 lb 1 oz)  Room/Bed: 368/A  Payor: [place] MEDICARE / Plan: [place] MEDICARE ADVANTAGE PPO / Product Type: PPO /       PMH:  Past Medical History:   Diagnosis Date    Anxiety     Depression     Diabetes (CMS HCC)     Diabetes mellitus, type 2 (CMS HCC)     GERD (gastroesophageal reflux disease)     MRSA (methicillin resistant staph aureus) culture positive 08/06/2019    L eye cellulitis    Osteomyelitis (CMS HCC)     Peripheral neuropathy            Assessment:      (P) Pt presents with impaired functional mobility, impaired cognition and speech, coordination and balance deficits, restless currently, needs several attempts then increased assist to complete desired task such as supine to long sitting, to sit EOB and maintain sitting to try to eat. Post LOB while sitting, appears to have bilat foot drop because Pt was able to follow all other instructions during MMT. Unable to stand today due to high fall risk, unsteady sitting. Follow for sitting B/T to progress to standing and pregait.    Discharge Needs:    Equipment Recommendation: (P) hospital bed, front wheeled walker, wheelchair      The patient presents with mobility limitations due to impaired balance, impaired strength, impaired functional activity tolerance, and coordination  that significantly impair/prevent patient's ability to participate in mobility-related activities of daily living (MRADLs) including  ambulation and transfers in order to safely complete, toileting, bathing, in reasonable time. This functional mobility deficit can be sufficiently resolved with the use of a (P) hospital bed, front wheeled walker, wheelchair  in order to decrease the  risk of falls, morbidity, and mortality in performance of these MRADLs.  Patient is able to safely use this assistive device.    Discharge Disposition: (P) skilled nursing facility    JUSTIFICATION OF DISCHARGE RECOMMENDATION   Based on current diagnosis, functional performance prior to admission, and current functional performance, this patient requires continued PT services in (P) skilled nursing facility in order to achieve significant functional improvements in these deficit areas: (P) arousal, attention, and cognition, muscle performance, posture, neuromuscular, gait, locomotion, and balance, aerobic capacity/endurance.        Plan:   Current Intervention: (P) strengthening, bed mobility training, balance training, neuromuscular re-education, motor coordination training, patient/family education, postural re-education, transfer training  To provide physical therapy services (P) 1x/day, minimum of 1x/week (1-2x/day)  for duration of (P) until discharge.    The risks/benefits of therapy have been discussed with the patient/caregiver and he/she is in agreement with the established plan of care.       Subjective & Objective     Past Medical History:   Diagnosis Date    Anxiety     Depression     Diabetes (CMS HCC)     Diabetes mellitus, type 2 (CMS HCC)     GERD (gastroesophageal reflux disease)     MRSA (methicillin resistant staph aureus) culture positive 08/06/2019    L eye cellulitis    Osteomyelitis (CMS HCC)     Peripheral neuropathy  Past Surgical History:   Procedure Laterality Date    MANDIBLE SURGERY      SHOULDER SURGERY                   03/15/23 1124   Rehab Session   Document Type evaluation   PT Visit Date 03/15/23   Total PT Minutes: 23   Patient Effort poor   Patient Effort, Rehab Treatment Comment supine to sit, sitting balance/tolerance   Symptoms Noted During/After Treatment other (see comments)  (unable to decipher what pt says)   Symptoms Noted Comment posterior LOB   General  Information   Patient Profile Reviewed yes   Onset of Illness/Injury or Date of Surgery 03/12/23   Pertinent History of Current Functional Problem Pt is a 62-YO male, nursing home resident, S/P fall, opiate use disorder, met encephalopathy, DM with hyper and hypoglycemia, CKD3.   Medical Lines Foley Catheter   Respiratory Status room air   Existing Precautions/Restrictions fall precautions   General Observations of Patient Pt is restless in bed, trying to reach for bedrail and situp. Pt has speech impediments with big tongue protruding.   Mutuality/Individual Preferences   Anxieties, Fears or Concerns falling   Individualized Care Needs assist with feeding   Patient-Specific Goals (Include Timeframe) DC to NH   Plan of Care Reviewed With patient   Living Environment   Lives With facility resident   Home Assessment: No Problems Identified   Transportation Available ambulance   Functional Level Prior   Ambulation 1 - assistive equipment   Transferring 2 - assistive person   Toileting 0 - independent   Bathing 2 - assistive person   Dressing 2 - assistive person   Eating 0 - independent   Communication 0 - understands/communicates without difficulty   Prior Functional Level Comment Chart review notes from OT eval, questionable Pt historian   Self-Care   Current Activity Tolerance poor   Pre Treatment Status   Pre Treatment Patient Status Patient supine in bed   Support Present Pre Treatment  None   Communication Pre Treatment  Nurse   Communication Pre Treatment Comment Cleared for PT, AMS   Cognitive Assessment/Interventions   Behavior/Mood Observations anxious;alert;cooperative;excitable;restless   Orientation Status person;place   Attention needs cues to re-direct   Vital Signs   Pre-Treatment Heart Rate (beats/min) 76   Post-treatment Heart Rate (beats/min) 79   Pre SpO2 (%) 97   O2 Delivery Pre Treatment room air   Post SpO2 (%) 95   O2 Delivery Post Treatment room air   Pain Assessment   Pain Intervention  PRN  Medication   Pre/Posttreatment Pain Comment Unable to decipher, did not particular wince, just restless trying to sit EOB   RUE Assessment   RUE Assessment X- Exceptions   RUE ROM 50% impaired at the shoulder   RUE Strength 3+/5   RUE Coordination Fair to poor   LUE Assessment   LUE Assessment WFL- Within Functional Limits   LUE Other clawhand deformity   RLE Assessment   RLE Assessment X-Exceptions   RLE Strength 4-/5   RLE Coordination fair to poor   RLE Other foot drop   LLE Assessment   LLE Assessment X-Exceptions   LLE Other foot drop   Trunk Assessment   Trunk Assessment X-Exceptions   Trunk Strength 3/5 to 3-/5  (unable to maintain sitting)   Bed Mobility Assessment/Treatment   Bed Mobility, Assistive Device bed rails;Head of Bed Elevated;draw sheet   Supine-Sit Independence  2 person assist required   Safety Issues cognitive deficits limit understanding;decreased use of arms for pushing/pulling;impaired trunk control for bed mobility   Impairments postural control impaired;strength decreased;endurance;coordination impaired;balance impaired;cognition impaired   Transfer Assessment/Treatment   Sit-Stand Independence not tested   Gait Assessment/Treatment   Independence  not tested;not appropriate to assess   Balance   Sitting Balance: Static poor balance  (Needs Min to Mod A to maintain sitting)   Sitting, Dynamic (Balance) unable to balance   Therapeutic Exercise/Activity   Comment supine to sit, sitting B/T, scooting   Post Treatment Status   Post Treatment Patient Status Patient sitting on edge of bed   Support Present Post Treatment    (PCTs, one to support back, one to feed pt, educated)   Film/video editor Other  (If pt falls back more frequently, return to supine HOB elevated)   Communication Post Treatment Comment voiced understanding   Plan of Care Review   Plan Of Care Reviewed With patient   Physical Therapy Clinical Impression   Assessment Pt presents with impaired functional mobility,  impaired cognition and speech, coordination and balance deficits, restless currently, needs several attempts then increased assist to complete desired task such as supine to long sitting, to sit EOB and maintain sitting to try to eat. Post LOB while sitting, appears to have bilat foot drop because Pt was able to follow all other instructions during MMT. Unable to stand today due to high fall risk, unsteady sitting. Follow for sitting B/T to progress to standing and pregait.   Criteria for Skilled Therapeutic yes;meets criteria;skilled treatment is necessary   Pathology/Pathophysiology Noted neuromuscular;endocrine/metabolic;musculoskeletal   Impairments Found (describe specific impairments) arousal, attention, and cognition;muscle performance;posture;neuromuscular;gait, locomotion, and balance;aerobic capacity/endurance   Functional Limitations in Following  self-care;community/leisure   Disability: Inability to Perform community/leisure   Rehab Potential fair   Therapy Frequency 1x/day;minimum of 1x/week  (1-2x/day)   Predicted Duration of Therapy Intervention (days/wks) until discharge   Anticipated Equipment Needs at Discharge (PT) hospital bed;front wheeled walker;wheelchair   Anticipated Discharge Disposition skilled nursing facility   Referral Needed to Another Service speech language pathology   Evaluation Complexity Justification   Patient History: Co-morbidity/factors that impact Plan of Care 3 or more that impact Plan of Care;Behavioral patterns   Examination Components Range of motion;Strength;Balance;Bed mobility   Presentation Stable: Uncomplicated, straight-forward, problem focused   Clinical Decision Making Moderate complexity   Evaluation Complexity High complexity   Care Plan Goals   PT Rehab Goals Bed Mobility Goal;Transfer Training Goal   Bed Mobility Goal   Bed Mobility Goal, Date Established 03/15/23   Bed Mobility Goal, Time to Achieve by discharge   Bed Mobility Goal, Activity Type all bed  mobility activities   Bed Mobility Goal, Current Status moderate assist (50% patient effort);1 person + 1 person to manage equipment   Bed Mobility Goal, Independence Level supervision required   Bed Mobility Goal, Assistive Device bed rails   Transfer Training Goal   Transfer Training Goal, Date Established 03/15/23   Transfer Training Goal, Time to Achieve by discharge   Transfer Training Goal, Activity Type sit-to-stand/stand-to-sit;bed-to-chair/chair-to-bed   Transfer Training Goal, Current Status other (see comments)  (NT, likely at least assist x2)   Transfer Training Goal, Independence Level minimum assist (75% patient effort);1 person + 1 person to manage equipment   Transfer Training Goal, Assist Device walker, rolling   Planned Therapy Interventions, PT Eval   Planned Therapy Interventions (PT) strengthening;bed mobility training;balance training;neuromuscular re-education;motor  coordination training;patient/family education;postural re-education;transfer training   Psychosocial Support   Trust Relationship/Rapport care explained;choices provided;emotional support provided;thoughts/feelings acknowledged               INTERVENTION MINUTES: EVALUATION 15 minutes and THERAPEUTIC ACTIVITY 8 minutes    EVALUATION COMPLEXITY : CLINICAL DECISION MAKING OF HIGH COMPLEXITY AS INDICATED BY PMHX, PHYSICAL THERAPY ASSESSMENT OF MUSCULOSKELETAL AND NEUROLOGICAL SYSTEMS AND ACTIVITY LIMITATIONS.  CLINICAL PRESENTATION IS UNSTABLE WITH UNPREDICTABLE CHARACTERISTICS.     Therapist:     Luther Hearing, PT  03/15/2023, 16:06

## 2023-03-15 NOTE — Care Plan (Signed)
Problem: Adult Inpatient Plan of Care  Goal: Plan of Care Review  Outcome: Ongoing (see interventions/notes)  Goal: Patient-Specific Goal (Individualized)  Outcome: Ongoing (see interventions/notes)  Flowsheets (Taken 03/15/2023 2000)  Individualized Care Needs: assist with feeding, ADL's, monitor vital signs, prn medicaiton  Anxieties, Fears or Concerns: falling  Patient-Specific Goals (Include Timeframe): discharge when criteria met  Plan of Care Reviewed With: patient  Goal: Absence of Hospital-Acquired Illness or Injury  Outcome: Ongoing (see interventions/notes)  Intervention: Identify and Manage Fall Risk  Recent Flowsheet Documentation  Taken 03/15/2023 2148 by Francesco Runner, RN  Safety Promotion/Fall Prevention:   activity supervised   fall prevention program maintained   nonskid shoes/slippers when out of bed   safety round/check completed  Taken 03/15/2023 2000 by Francesco Runner, RN  Safety Promotion/Fall Prevention:   activity supervised   fall prevention program maintained   nonskid shoes/slippers when out of bed   safety round/check completed  Intervention: Prevent Skin Injury  Recent Flowsheet Documentation  Taken 03/15/2023 2000 by Francesco Runner, RN  Body Position: supine, head elevated  Intervention: Prevent and Manage VTE (Venous Thromboembolism) Risk  Recent Flowsheet Documentation  Taken 03/15/2023 2000 by Francesco Runner, RN  VTE Prevention/Management: anticoagulant therapy maintained  Goal: Optimal Comfort and Wellbeing  Outcome: Ongoing (see interventions/notes)  Intervention: Provide Person-Centered Care  Recent Flowsheet Documentation  Taken 03/15/2023 2000 by Francesco Runner, RN  Trust Relationship/Rapport:   care explained   choices provided   emotional support provided  Goal: Rounds/Family Conference  Outcome: Ongoing (see interventions/notes)     Problem: Health Knowledge, Opportunity to Enhance (Adult,Obstetrics,Pediatric)  Goal: Knowledgeable about Health Subject/Topic  Description: Patient will  demonstrate the desired outcomes by discharge/transition of care.  Outcome: Ongoing (see interventions/notes)     Problem: Fall Injury Risk  Goal: Absence of Fall and Fall-Related Injury  Outcome: Ongoing (see interventions/notes)  Intervention: Promote Injury-Free Environment  Recent Flowsheet Documentation  Taken 03/15/2023 2148 by Francesco Runner, RN  Safety Promotion/Fall Prevention:   activity supervised   fall prevention program maintained   nonskid shoes/slippers when out of bed   safety round/check completed  Taken 03/15/2023 2000 by Francesco Runner, RN  Safety Promotion/Fall Prevention:   activity supervised   fall prevention program maintained   nonskid shoes/slippers when out of bed   safety round/check completed     Problem: Skin Injury Risk Increased  Goal: Skin Health and Integrity  Outcome: Ongoing (see interventions/notes)  Intervention: Optimize Skin Protection  Recent Flowsheet Documentation  Taken 03/15/2023 2000 by Francesco Runner, RN  Activity Management: ROM, active encouraged

## 2023-03-15 NOTE — Nurses Notes (Signed)
The patient's son called and said that [facility] infection control reported to him that his father has "vancomycin resistant staphylococcus aureus". The son feels that appropriate care for his infection.

## 2023-03-15 NOTE — Telemedicine Progress Note (Addendum)
E-Consult Note      [name] [name]  1961-01-29  62 y.o., male  Date of Service: 03/15/2023    Provider that requested consult:        Reason for Consult: Dr. Leonette Most         Relevant Components of Epic chart/labs/radiology reviewed: yes    BMP:   143 (07/06 0936) 101 (07/06 1610) 40* (07/06 9604)    /     209* (07/06 5409)   3.2* (07/06 8119) 27 (07/06 1478) 3.97* (07/06 0936) \         CBC:     17.2* (07/06 2956) \   11.3* (07/06 2130) /   276 (07/06 8657)      / 35.4* (07/06 8469) \          Recommendations:     AKI on CKD 4  -prerenal + urinary retention  -baseline Cr 2.7  -Scr 3.97* (07/06 0936) , kidney function is improving  -start Flomax  -keep Foley.  -DC IV bicarb drip  -start LR at 75 cc hour  -UA was reviewed  -U/S reviewed  -no need for RRT  -TP/CR 6.5  -ua/cr 3.0 g  -I will order serology w/u     Electrolytes  -replace K.     ACID-BASE:  -improved      Time Spent: 5 min , reviewing the chart medications x-rays labs and progress and consultant notes, communicating with the hospitalist and the charge nurse time spent more than 35 minutes      [name], MD

## 2023-03-16 DIAGNOSIS — D72829 Elevated white blood cell count, unspecified: Secondary | ICD-10-CM

## 2023-03-16 DIAGNOSIS — Z7409 Other reduced mobility: Secondary | ICD-10-CM

## 2023-03-16 DIAGNOSIS — Z7952 Long term (current) use of systemic steroids: Secondary | ICD-10-CM

## 2023-03-16 DIAGNOSIS — T783XXA Angioneurotic edema, initial encounter: Secondary | ICD-10-CM

## 2023-03-16 DIAGNOSIS — T380X5A Adverse effect of glucocorticoids and synthetic analogues, initial encounter: Secondary | ICD-10-CM

## 2023-03-16 LAB — BASIC METABOLIC PANEL
ANION GAP: 13 mmol/L (ref 4–13)
BUN/CREA RATIO: 10 (ref 6–22)
BUN: 38 mg/dL — ABNORMAL HIGH (ref 7–25)
CALCIUM: 7.6 mg/dL — ABNORMAL LOW (ref 8.6–10.3)
CHLORIDE: 104 mmol/L (ref 98–107)
CO2 TOTAL: 26 mmol/L (ref 21–31)
CREATININE: 3.95 mg/dL — ABNORMAL HIGH (ref 0.60–1.30)
ESTIMATED GFR: 16 mL/min/{1.73_m2} — ABNORMAL LOW (ref >59)
GLUCOSE: 145 mg/dL — ABNORMAL HIGH (ref 74–109)
OSMOLALITY, CALCULATED: 297 mOsm/kg — ABNORMAL HIGH (ref 270–290)
POTASSIUM: 2.9 mmol/L — ABNORMAL LOW (ref 3.5–5.1)
SODIUM: 143 mmol/L (ref 136–145)

## 2023-03-16 LAB — POC BLOOD GLUCOSE (RESULTS)
GLUCOSE, POC: 147 mg/dl — ABNORMAL HIGH (ref 70–100)
GLUCOSE, POC: 249 mg/dl — ABNORMAL HIGH (ref 70–100)
GLUCOSE, POC: 230 mg/dl — ABNORMAL HIGH (ref 70–100)

## 2023-03-16 LAB — CBC WITH DIFF
BASOPHIL #: 0.1 10*3/uL (ref 0.00–0.10)
BASOPHIL %: 0 % (ref 0–1)
EOSINOPHIL #: 0 10*3/uL (ref 0.00–0.50)
EOSINOPHIL %: 0 % — ABNORMAL LOW
HCT: 36.9 % (ref 36.7–47.1)
HGB: 11.6 g/dL — ABNORMAL LOW (ref 12.5–16.3)
LYMPHOCYTE #: 1.2 10*3/uL (ref 1.00–3.00)
LYMPHOCYTE %: 4 % — ABNORMAL LOW (ref 16–44)
MCH: 26.5 pg (ref 23.8–33.4)
MCHC: 31.5 g/dL — ABNORMAL LOW (ref 32.5–36.3)
MCV: 84.1 fL (ref 73.0–96.2)
MONOCYTE #: 1.2 10*3/uL — ABNORMAL HIGH (ref 0.30–1.00)
MONOCYTE %: 5 % (ref 5–13)
MPV: 8.6 fL (ref 7.4–11.4)
NEUTROPHIL #: 24.2 10*3/uL — ABNORMAL HIGH (ref 1.85–7.80)
NEUTROPHIL %: 91 % — ABNORMAL HIGH (ref 43–77)
PLATELETS: 313 10*3/uL (ref 140–440)
RBC: 4.39 10*6/uL (ref 4.06–5.63)
RDW: 17 % — ABNORMAL HIGH (ref 12.1–16.2)
WBC: 26.8 10*3/uL — ABNORMAL HIGH (ref 3.6–10.2)

## 2023-03-16 LAB — RENAL FUNCTION PANEL
ALBUMIN: 3.2 g/dL — ABNORMAL LOW (ref 3.5–5.7)
ANION GAP: 14 mmol/L — ABNORMAL HIGH (ref 4–13)
BUN: 39 mg/dL — ABNORMAL HIGH (ref 7–25)
CALCIUM, CORRECTED: 8.3 mg/dL — ABNORMAL LOW (ref 8.9–10.8)
CALCIUM: 7.7 mg/dL — ABNORMAL LOW (ref 8.6–10.3)
CHLORIDE: 103 mmol/L (ref 98–107)
CO2 TOTAL: 26 mmol/L (ref 21–31)
CREATININE: 4 mg/dL — ABNORMAL HIGH (ref 0.60–1.30)
ESTIMATED GFR: 16 mL/min/{1.73_m2} — ABNORMAL LOW (ref >59)
GLUCOSE: 144 mg/dL — ABNORMAL HIGH (ref 74–109)
OSMOLALITY, CALCULATED: 297 mOsm/kg — ABNORMAL HIGH (ref 270–290)
PHOSPHORUS: 3.6 mg/dL — ABNORMAL LOW (ref 3.7–7.2)
POTASSIUM: 2.9 mmol/L — ABNORMAL LOW (ref 3.5–5.1)
SODIUM: 143 mmol/L (ref 136–145)

## 2023-03-16 LAB — SCAN DIFFERENTIAL: PLATELET MORPHOLOGY COMMENT: ADEQUATE

## 2023-03-16 LAB — PARATHYROID HORMONE (PTH): PTH: 611.6 pg/mL — ABNORMAL HIGH (ref 12.0–88.0)

## 2023-03-16 LAB — HIV 1 AND 2 RAPID SCREEN
HIV-1/2 ANTIBODY SCREEN: NONREACTIVE
HIV1-p24 ANTIGEN SCREEN: NONREACTIVE

## 2023-03-16 LAB — MAGNESIUM: MAGNESIUM: 1.8 mg/dL — ABNORMAL LOW (ref 1.9–2.7)

## 2023-03-16 LAB — VITAMIN D 25 TOTAL: VITAMIN D: 30 ng/mL (ref 30–100)

## 2023-03-16 MED ORDER — POTASSIUM CHLORIDE ER 20 MEQ TABLET,EXTENDED RELEASE(PART/CRYST)
40.0000 meq | ORAL_TABLET | Freq: Three times a day (TID) | ORAL | Status: AC
Start: 2023-03-16 — End: 2023-03-17
  Administered 2023-03-16 – 2023-03-17 (×3): 40 meq via ORAL
  Filled 2023-03-16 (×3): qty 2

## 2023-03-16 MED ORDER — POTASSIUM CHLORIDE ER 20 MEQ TABLET,EXTENDED RELEASE(PART/CRYST)
40.0000 meq | ORAL_TABLET | Freq: Once | ORAL | Status: DC
Start: 2023-03-16 — End: 2023-03-17
  Administered 2023-03-16: 0 meq via ORAL

## 2023-03-16 MED ORDER — ALBUMIN, HUMAN 25 % INTRAVENOUS SOLUTION
25.0000 g | Freq: Two times a day (BID) | INTRAVENOUS | Status: AC
Start: 2023-03-16 — End: 2023-03-16
  Administered 2023-03-16: 25 g via INTRAVENOUS
  Administered 2023-03-16: 0 g via INTRAVENOUS
  Administered 2023-03-16: 25 g via INTRAVENOUS
  Administered 2023-03-16: 0 g via INTRAVENOUS
  Filled 2023-03-16 (×2): qty 100

## 2023-03-16 MED ORDER — DEXAMETHASONE SODIUM PHOSPHATE (PF) 10 MG/ML INJECTION SOLUTION
10.0000 mg | Freq: Two times a day (BID) | INTRAMUSCULAR | Status: AC
Start: 2023-03-16 — End: 2023-03-17
  Administered 2023-03-16 – 2023-03-17 (×3): 10 mg via INTRAVENOUS
  Filled 2023-03-16 (×3): qty 1

## 2023-03-16 NOTE — Progress Notes (Signed)
[place] MEDICINE [facility]     HOSPITALIST PROGRESS NOTE    Assessment/Plan:    [name] [name] is a 62 y.o. male who presented to [facility] with AMS (altered mental status).    Concern for angioedema  Tongue appears significantly swollen with compromise speech   Airway remains patent and he does not require supplemental oxygen  Chest is clear to auscultation bilaterally  VSS  Unknown etiology/offending agent  Will treat with Decadron and Pepcid  Monitor for improvement    Steroid induced leukocytosis   Secondary to use of Decadron   Angioedema improving with Decadron     Acute metabolic encephalopathy, resolved   Auditory and visual hallucination, resolved   Multifactorial:  Uremia, narcotics  Mentation is now back to baseline       Acute renal failure   Probably baseline CKD Stage 3  Metabolic acidemia  Multifactorial:  Prerenal Azotemia, adverse effects of meds   Start bicarb drip   Renal ultrasound WNL  Avoid all nephrotoxins  Nephrology consult greatly appreciated    Opiate use disorder   On Suboxone 3 times daily (held)  Dose appears to be too high   Will hold Suboxone for now until mentation improves  Check UDS    Diabetes with hyperglycemia & Hypoglycemia  Steroid induced hyperglycemia  A1c of 6.1 on 03/14/2023  Continue insulin therapy    Impaired mobility and ADLs  PT evaluation      Code Status: Full Code    VTE prophylaxis: HSQ    Disposition: TBD       LOS: 2 days     Subjective     Patient seen and examined at bedside.   Patient was awake, alert and oriented x3   Tongue is swollen and impairing speech  Mentation seemed to be the clearest it has been so far on this hospitalization  Continue decadron   Reassess    Objective     Vital signs in last 24 hours:  Filed Vitals:    03/15/23 1942 03/15/23 2234 03/16/23 0029 03/16/23 0731   BP: 122/81  115/69 135/68   Pulse: 98 87 88 91   Resp: 17  17 16    Temp: 36.6 C (97.8 F)  36.8 C (98.2 F) 37.7 C (99.8 F)   SpO2: 93%  92% 93%        Intake/Output last 3 shifts:    Intake/Output Summary (Last 24 hours) at 03/16/2023 1041  Last data filed at 03/16/2023 0600  Gross per 24 hour   Intake 0 ml   Output 1000 ml   Net -1000 ml        Physical Exam:    General: Well-developed, well-nourished.  Pleasantly confused  Cardiovascular:  Regular rate, normal rhythm, S1 and S2 present, no murmur, no gallop, no rub, normal peripheral perfusion  Respiratory: Clear to auscultation bilaterally, bilateral breath sounds, normal effort  Gastrointestinal: Soft, benign, nondistended, nontender, no mass  Extremities: No gross deformity, no cyanosis or edema  Skin: Warm, dry; no rashes or lesions    In-Hospital Medications:    Current Facility-Administered Medications   Medication Dose Route Frequency    acetaminophen (TYLENOL) tablet  650 mg Oral Q4H PRN    albumin human (ALBUMINAR) 25% premix infusion  25 g Intravenous Q12H    albuterol (PROVENTIL) 2.5 mg / 3 mL (0.083%) neb solution  2.5 mg Nebulization Q4H PRN    alcohol 62 % (NOZIN NASAL SANITIZER) nasal solution  1 Each Each  Nostril 2x/day    aluminum-magnesium hydroxide-simethicone (MAG-AL PLUS) 200-200-20 mg per 5 mL oral liquid  15 mL Oral 4x/day PRN    aspirin tablet 325 mg  325 mg Oral Daily    [Held by provider] buprenorphine-naloxone (SUBOXONE) 2-0.5mg  per sublingual tablet  1 Tablet Sublingual 2x/day    collagenase (SANTYL) 250 unit/gm ointment   Apply Topically Daily    Correction/SSIP insulin lispro (HumaLOG) 100 units/mL injection  0-18 Units Subcutaneous 4x/day AC    dexAMETHasone (PF) 10 mg/mL injection  10 mg Intravenous Q12H    dextrose (GLUTOSE) 40% oral gel  15 g Oral Q15 Min PRN    dextrose 50% (0.5 g/mL) injection - syringe  12.5 g Intravenous Q15 Min PRN    diphenhydrAMINE (BENADRYL) 50 mg/mL injection  25 mg Intravenous Q6H PRN    docusate sodium (COLACE) capsule  100 mg Oral 2x/day    famotidine (PEPCID) 10 mg/mL injection  10 mg Intravenous 2x/day    glucagon (GLUCAGEN DIAGNOSTIC KIT)  injection 1 mg  1 mg IntraMUSCULAR Once PRN    guaiFENesin 100mg  per 5mL oral liquid - for cough (expectorant)  200 mg Oral Q4H PRN    haloperidol (HALDOL) 5 mg/mL injection  5 mg Intravenous Q6H PRN    heparin 5,000 unit/mL injection  5,000 Units Subcutaneous Q12H    hydrALAZINE (APRESOLINE) injection 10 mg  10 mg Intravenous Q4H PRN    LORazepam (ATIVAN) 2 mg/mL injection  0.5 mg Intravenous Q4H PRN    LR premix infusion   Intravenous Continuous    nitroGLYCERIN (NITROSTAT) sublingual tablet  0.4 mg Sublingual Q5 Min PRN    ondansetron (ZOFRAN) 2 mg/mL injection  4 mg Intravenous Q8H PRN    polyethylene glycol (MIRALAX) oral packet  17 g Oral Daily PRN    potassium chloride (K-DUR) extended release tablet  40 mEq Oral 3x/day-Meals    potassium chloride (K-DUR) extended release tablet  40 mEq Oral Once    tamsulosin (FLOMAX) capsule  0.4 mg Oral Daily after Dinner    traZODone (DESYREL) tablet  50 mg Oral HS PRN          Labs:    Results for orders placed or performed during the hospital encounter of 03/12/23 (from the past 24 hour(s))   URINE DRUG SCREEN    Collection Time: 03/12/23  1:22 PM   Result Value Ref Range    AMPHETAMINES URINE Negative Negative    BARBITURATES URINE Negative Negative    BENZODIAZEPINES URINE Negative Negative    METHADONE URINE Negative Negative    COCAINE METABOLITES URINE Negative Negative    OPIATES URINE Negative Negative    PCP URINE Negative Negative    CANNABINOIDS URINE Negative Negative   URINALYSIS, MACRO/MICRO    Collection Time: 03/12/23  1:22 PM   Result Value Ref Range    COLOR Light Yellow (A) Yellow    APPEARANCE Slightly Hazy (A) Clear    SPECIFIC GRAVITY 1.025 1.003 - 1.035    PH 6.0 4.6 - 8.0    LEUKOCYTES Negative Negative WBCs/uL    NITRITE Negative Negative    PROTEIN >= 300 (A) Negative mg/dL    GLUCOSE 161 (A) Negative mg/dL    KETONES Trace (A) Negative mg/dL    BILIRUBIN Negative Negative mg/dL    BLOOD Moderate (A) Negative mg/dL    UROBILINOGEN 0.2 0.2 - 1.0  mg/dL   URINALYSIS, MICROSCOPIC    Collection Time: 03/12/23  1:22 PM   Result Value Ref Range  RBCS 11-15 (A) 0-3, Not Present /hpf    BACTERIA Few (A) Negative /hpf    MUCOUS Few (A) (none) /hpf    WBCS 0-5 Not Present, Occasional, 0-5 /hpf    SQUAMOUS EPITHELIAL Few Not Present, Few /hpf   COMPREHENSIVE METABOLIC PANEL, NON-FASTING    Collection Time: 03/12/23  1:43 PM   Result Value Ref Range    SODIUM 139 136 - 145 mmol/L    POTASSIUM 3.2 (L) 3.5 - 5.1 mmol/L    CHLORIDE 103 98 - 107 mmol/L    CO2 TOTAL 22 21 - 32 mmol/L    ANION GAP 14 (H) 4 - 13 mmol/L    BUN 41 (H) 7 - 18 mg/dL    CREATININE 1.61 (H) 0.70 - 1.30 mg/dL    BUN/CREA RATIO 9     ESTIMATED GFR 13 (L) >59 mL/min/1.31m^2    ALBUMIN 2.3 (L) 3.4 - 5.0 g/dL    CALCIUM 8.4 (L) 8.5 - 10.1 mg/dL    GLUCOSE 90 74 - 096 mg/dL    ALKALINE PHOSPHATASE 86 46 - 116 U/L    ALT (SGPT) 12 <=78 U/L    AST (SGOT) 13 (L) 15 - 37 U/L    BILIRUBIN TOTAL 0.5 0.2 - 1.0 mg/dL    PROTEIN TOTAL 7.6 6.4 - 8.2 g/dL    ALBUMIN/GLOBULIN RATIO 0.4 (L) 0.8 - 1.4    OSMOLALITY, CALCULATED 287 270 - 290 mOsm/kg    CALCIUM, CORRECTED 9.8 mg/dL    GLOBULIN 5.3    CBC WITH DIFF    Collection Time: 03/12/23  1:43 PM   Result Value Ref Range    WBC 18.7 (H) 4.0 - 10.5 x10^3/uL    RBC 4.31 4.20 - 6.00 x10^6/uL    HGB 11.8 (L) 13.5 - 18.0 g/dL    HCT 04.5 (L) 40.9 - 51.0 %    MCV 86.2 78.0 - 99.0 fL    MCH 27.5 27.0 - 32.0 pg    MCHC 31.8 (L) 32.0 - 36.0 g/dL    RDW 81.1 (H) 91.4 - 14.8 %    PLATELETS 349 140 - 440 x10^3/uL    MPV 8.1 7.4 - 10.4 fL    NEUTROPHIL % 89 (H) 40 - 76 %    LYMPHOCYTE % 6 (L) 25 - 45 %    MONOCYTE % 5 0 - 12 %    EOSINOPHIL % 1 0 - 7 %    BASOPHIL % 0 0 - 3 %    NEUTROPHIL # 16.56 (H) 1.80 - 8.40 x10^3/uL    LYMPHOCYTE # 1.08 (L) 1.10 - 5.00 x10^3/uL    MONOCYTE # 0.91 0.00 - 1.30 x10^3/uL    EOSINOPHIL # 0.13 0.00 - 0.80 x10^3/uL    BASOPHIL # 0.02 0.00 - 0.30 x10^3/uL   CREATINE KINASE (CK), TOTAL, SERUM OR PLASMA    Collection Time: 03/12/23  2:22 PM    Result Value Ref Range    CREATINE KINASE 92 39 - 308 U/L   AMMONIA    Collection Time: 03/12/23  2:31 PM   Result Value Ref Range    AMMONIA <10 (L) 11 - 32 umol/L   GOLD TOP TUBE    Collection Time: 03/12/23  2:41 PM   Result Value Ref Range    RAINBOW/EXTRA TUBE AUTO RESULT Yes    GRAY TOP TUBE    Collection Time: 03/12/23  2:41 PM   Result Value Ref Range    RAINBOW/EXTRA TUBE AUTO RESULT Yes  ECG 12 LEAD    Collection Time: 03/12/23  3:09 PM   Result Value Ref Range    Ventricular rate 87 BPM    Atrial Rate 87 BPM    PR Interval 160 ms    QRS Duration 108 ms    QT Interval 398 ms    QTC Calculation 478 ms    Calculated P Axis 5 degrees    Calculated R Axis -65 degrees    Calculated T Axis 24 degrees   BASIC METABOLIC PANEL, NON-FASTING    Collection Time: 03/13/23  4:31 AM   Result Value Ref Range    SODIUM 136 136 - 145 mmol/L    POTASSIUM 3.1 (L) 3.5 - 5.1 mmol/L    CHLORIDE 106 98 - 107 mmol/L    CO2 TOTAL 17 (L) 21 - 31 mmol/L    ANION GAP 13 4 - 13 mmol/L    CALCIUM 8.2 (L) 8.6 - 10.3 mg/dL    GLUCOSE 63 (L) 74 - 109 mg/dL    BUN 40 (H) 7 - 25 mg/dL    CREATININE 1.61 (H) 0.60 - 1.30 mg/dL    BUN/CREA RATIO 10 6 - 22    ESTIMATED GFR 15 (L) >59 mL/min/1.71m^2    OSMOLALITY, CALCULATED 280 270 - 290 mOsm/kg   MAGNESIUM    Collection Time: 03/13/23  4:31 AM   Result Value Ref Range    MAGNESIUM 1.9 1.9 - 2.7 mg/dL   CBC WITH DIFF    Collection Time: 03/13/23  4:31 AM   Result Value Ref Range    WBC 16.7 (H) 3.6 - 10.2 x10^3/uL    RBC 4.14 4.06 - 5.63 x10^6/uL    HGB 11.1 (L) 12.5 - 16.3 g/dL    HCT 09.6 (L) 04.5 - 47.1 %    MCV 83.9 73.0 - 96.2 fL    MCH 26.8 23.8 - 33.4 pg    MCHC 31.9 (L) 32.5 - 36.3 g/dL    RDW 40.9 (H) 81.1 - 16.2 %    PLATELETS 289 140 - 440 x10^3/uL    MPV 8.8 7.4 - 11.4 fL    NEUTROPHIL % 86 (H) 43 - 77 %    LYMPHOCYTE % 7 (L) 16 - 44 %    MONOCYTE % 5 5 - 13 %    EOSINOPHIL % 2 %    BASOPHIL % 1 0 - 1 %    NEUTROPHIL # 14.30 (H) 1.85 - 7.80 x10^3/uL    LYMPHOCYTE # 1.10 1.00 -  3.00 x10^3/uL    MONOCYTE # 0.90 0.30 - 1.00 x10^3/uL    EOSINOPHIL # 0.30 0.00 - 0.50 x10^3/uL    BASOPHIL # 0.10 0.00 - 0.10 x10^3/uL   RETICULOCYTE COUNT    Collection Time: 03/13/23  4:31 AM   Result Value Ref Range    RETICULOCYTE % AUTOMATED 1.07 0.5 - 2.17 %    IMMATURE RETIC FRACTION 0.32 0.29 - 0.53    RETICULOCYTES COUNT # AUTOMATED 0.0443 0.0221 - 0.0963 10(6)/uL   FERRITIN    Collection Time: 03/13/23  4:31 AM   Result Value Ref Range    FERRITIN 86 11 - 336 ng/mL   VITAMIN B12    Collection Time: 03/13/23  4:31 AM   Result Value Ref Range    VITAMIN B 12 221 180 - 914 pg/mL   FOLATE    Collection Time: 03/13/23  4:31 AM   Result Value Ref Range    FOLATE 18.2 5.9 - 24.4  ng/mL   IRON TRANSFERRIN AND TIBC    Collection Time: 03/13/23  4:31 AM   Result Value Ref Range    TOTAL IRON BINDING CAPACITY 190 (L) 250 - 450 ug/dL    IRON (TRANSFERRIN) SATURATION 13 (L) 20 - 50 %    IRON 25 (L) 50 - 212 ug/dL    TRANSFERRIN 086 (L) 203 - 362 mg/dL    UIBC 578 469 - 629 ug/dL   POC BLOOD GLUCOSE (RESULTS)    Collection Time: 03/13/23  5:55 AM   Result Value Ref Range    GLUCOSE, POC 98 70 - 100 mg/dl       Micro:    No results found for any visits on 03/12/23 (from the past 24 hour(s)).       Imaging:    ECG 12 LEAD  Normal sinus rhythm  Left axis deviation  Inferior infarct , age undetermined  Cannot rule out Anterior infarct , age undetermined  Abnormal ECG  Confirmed by Renae Gloss [phone]) on 03/14/2023 11:23:44 AM         Audrea Muscat, MD  March 16, 2023    I personally spent 50 minutes face-to-face and non-face-to-face in the care of this patient, which includes all pre, intra, and post visit time on the date of service.  All documented time was specific to the E/M visit and does not include any procedures that may have been performed.

## 2023-03-16 NOTE — Progress Notes (Signed)
[place] MEDICINE [facility]     HOSPITALIST PROGRESS NOTE    Assessment/Plan:    [name] [name] is a 62 y.o. male who presented to [facility] with AMS (altered mental status).    Concern for angioedema  Tongue appears significantly swollen with compromise speech   Airway remains patent and he does not require supplemental oxygen  Chest is clear to auscultation bilaterally  VSS  Unknown etiology/offending agent  Will treat with Decadron and Pepcid  Monitor for improvement    Acute metabolic encephalopathy, resolved   Auditory and visual hallucination, resolved   Multifactorial:  Uremia, narcotics  Mentation is now back to baseline       Acute renal failure   Probably baseline CKD Stage 3  Metabolic acidemia  Multifactorial:  Prerenal Azotemia, adverse effects of meds   Start bicarb drip   Renal ultrasound WNL  Avoid all nephrotoxins  Nephrology consult greatly appreciated    Opiate use disorder   On Suboxone 3 times daily (held)  Dose appears to be too high   Will hold Suboxone for now until mentation improves  Check UDS    Diabetes with hyperglycemia & Hypoglycemia  Check A1c  Continue insulin therapy      Code Status: Full Code    VTE prophylaxis: HSQ    Disposition: TBD       LOS: 2 days     Subjective     Patient seen and examined at bedside.   Patient was awake, alert and oriented x3   Mentation improved with holding of narcotics  Tongue is swollen and impairing speech  Continue decadron   reassess    Objective     Vital signs in last 24 hours:  Filed Vitals:    03/15/23 1942 03/15/23 2234 03/16/23 0029 03/16/23 0731   BP: 122/81  115/69 135/68   Pulse: 98 87 88 91   Resp: 17  17 16    Temp: 36.6 C (97.8 F)  36.8 C (98.2 F) 37.7 C (99.8 F)   SpO2: 93%  92% 93%       Intake/Output last 3 shifts:    Intake/Output Summary (Last 24 hours) at 03/16/2023 1040  Last data filed at 03/16/2023 0600  Gross per 24 hour   Intake 0 ml   Output 1000 ml   Net -1000 ml        Physical Exam:    General:  Well-developed, well-nourished.  Pleasantly confused  Cardiovascular:  Regular rate, normal rhythm, S1 and S2 present, no murmur, no gallop, no rub, normal peripheral perfusion  Respiratory: Clear to auscultation bilaterally, bilateral breath sounds, normal effort  Gastrointestinal: Soft, benign, nondistended, nontender, no mass  Extremities: No gross deformity, no cyanosis or edema  Skin: Warm, dry; no rashes or lesions    In-Hospital Medications:    Current Facility-Administered Medications   Medication Dose Route Frequency    acetaminophen (TYLENOL) tablet  650 mg Oral Q4H PRN    albumin human (ALBUMINAR) 25% premix infusion  25 g Intravenous Q12H    albuterol (PROVENTIL) 2.5 mg / 3 mL (0.083%) neb solution  2.5 mg Nebulization Q4H PRN    alcohol 62 % (NOZIN NASAL SANITIZER) nasal solution  1 Each Each Nostril 2x/day    aluminum-magnesium hydroxide-simethicone (MAG-AL PLUS) 200-200-20 mg per 5 mL oral liquid  15 mL Oral 4x/day PRN    aspirin tablet 325 mg  325 mg Oral Daily    [Held by provider] buprenorphine-naloxone (SUBOXONE) 2-0.5mg  per sublingual  tablet  1 Tablet Sublingual 2x/day    collagenase (SANTYL) 250 unit/gm ointment   Apply Topically Daily    Correction/SSIP insulin lispro (HumaLOG) 100 units/mL injection  0-18 Units Subcutaneous 4x/day AC    dexAMETHasone (PF) 10 mg/mL injection  10 mg Intravenous Q12H    dextrose (GLUTOSE) 40% oral gel  15 g Oral Q15 Min PRN    dextrose 50% (0.5 g/mL) injection - syringe  12.5 g Intravenous Q15 Min PRN    diphenhydrAMINE (BENADRYL) 50 mg/mL injection  25 mg Intravenous Q6H PRN    docusate sodium (COLACE) capsule  100 mg Oral 2x/day    famotidine (PEPCID) 10 mg/mL injection  10 mg Intravenous 2x/day    glucagon (GLUCAGEN DIAGNOSTIC KIT) injection 1 mg  1 mg IntraMUSCULAR Once PRN    guaiFENesin 100mg  per 5mL oral liquid - for cough (expectorant)  200 mg Oral Q4H PRN    haloperidol (HALDOL) 5 mg/mL injection  5 mg Intravenous Q6H PRN    heparin 5,000 unit/mL  injection  5,000 Units Subcutaneous Q12H    hydrALAZINE (APRESOLINE) injection 10 mg  10 mg Intravenous Q4H PRN    LORazepam (ATIVAN) 2 mg/mL injection  0.5 mg Intravenous Q4H PRN    LR premix infusion   Intravenous Continuous    nitroGLYCERIN (NITROSTAT) sublingual tablet  0.4 mg Sublingual Q5 Min PRN    ondansetron (ZOFRAN) 2 mg/mL injection  4 mg Intravenous Q8H PRN    polyethylene glycol (MIRALAX) oral packet  17 g Oral Daily PRN    potassium chloride (K-DUR) extended release tablet  40 mEq Oral 3x/day-Meals    potassium chloride (K-DUR) extended release tablet  40 mEq Oral Once    tamsulosin (FLOMAX) capsule  0.4 mg Oral Daily after Dinner    traZODone (DESYREL) tablet  50 mg Oral HS PRN          Labs:    Results for orders placed or performed during the hospital encounter of 03/12/23 (from the past 24 hour(s))   URINE DRUG SCREEN    Collection Time: 03/12/23  1:22 PM   Result Value Ref Range    AMPHETAMINES URINE Negative Negative    BARBITURATES URINE Negative Negative    BENZODIAZEPINES URINE Negative Negative    METHADONE URINE Negative Negative    COCAINE METABOLITES URINE Negative Negative    OPIATES URINE Negative Negative    PCP URINE Negative Negative    CANNABINOIDS URINE Negative Negative   URINALYSIS, MACRO/MICRO    Collection Time: 03/12/23  1:22 PM   Result Value Ref Range    COLOR Light Yellow (A) Yellow    APPEARANCE Slightly Hazy (A) Clear    SPECIFIC GRAVITY 1.025 1.003 - 1.035    PH 6.0 4.6 - 8.0    LEUKOCYTES Negative Negative WBCs/uL    NITRITE Negative Negative    PROTEIN >= 300 (A) Negative mg/dL    GLUCOSE 130 (A) Negative mg/dL    KETONES Trace (A) Negative mg/dL    BILIRUBIN Negative Negative mg/dL    BLOOD Moderate (A) Negative mg/dL    UROBILINOGEN 0.2 0.2 - 1.0 mg/dL   URINALYSIS, MICROSCOPIC    Collection Time: 03/12/23  1:22 PM   Result Value Ref Range    RBCS 11-15 (A) 0-3, Not Present /hpf    BACTERIA Few (A) Negative /hpf    MUCOUS Few (A) (none) /hpf    WBCS 0-5 Not Present,  Occasional, 0-5 /hpf    SQUAMOUS EPITHELIAL Few Not Present, Few /  hpf   COMPREHENSIVE METABOLIC PANEL, NON-FASTING    Collection Time: 03/12/23  1:43 PM   Result Value Ref Range    SODIUM 139 136 - 145 mmol/L    POTASSIUM 3.2 (L) 3.5 - 5.1 mmol/L    CHLORIDE 103 98 - 107 mmol/L    CO2 TOTAL 22 21 - 32 mmol/L    ANION GAP 14 (H) 4 - 13 mmol/L    BUN 41 (H) 7 - 18 mg/dL    CREATININE 1.61 (H) 0.70 - 1.30 mg/dL    BUN/CREA RATIO 9     ESTIMATED GFR 13 (L) >59 mL/min/1.43m^2    ALBUMIN 2.3 (L) 3.4 - 5.0 g/dL    CALCIUM 8.4 (L) 8.5 - 10.1 mg/dL    GLUCOSE 90 74 - 096 mg/dL    ALKALINE PHOSPHATASE 86 46 - 116 U/L    ALT (SGPT) 12 <=78 U/L    AST (SGOT) 13 (L) 15 - 37 U/L    BILIRUBIN TOTAL 0.5 0.2 - 1.0 mg/dL    PROTEIN TOTAL 7.6 6.4 - 8.2 g/dL    ALBUMIN/GLOBULIN RATIO 0.4 (L) 0.8 - 1.4    OSMOLALITY, CALCULATED 287 270 - 290 mOsm/kg    CALCIUM, CORRECTED 9.8 mg/dL    GLOBULIN 5.3    CBC WITH DIFF    Collection Time: 03/12/23  1:43 PM   Result Value Ref Range    WBC 18.7 (H) 4.0 - 10.5 x10^3/uL    RBC 4.31 4.20 - 6.00 x10^6/uL    HGB 11.8 (L) 13.5 - 18.0 g/dL    HCT 04.5 (L) 40.9 - 51.0 %    MCV 86.2 78.0 - 99.0 fL    MCH 27.5 27.0 - 32.0 pg    MCHC 31.8 (L) 32.0 - 36.0 g/dL    RDW 81.1 (H) 91.4 - 14.8 %    PLATELETS 349 140 - 440 x10^3/uL    MPV 8.1 7.4 - 10.4 fL    NEUTROPHIL % 89 (H) 40 - 76 %    LYMPHOCYTE % 6 (L) 25 - 45 %    MONOCYTE % 5 0 - 12 %    EOSINOPHIL % 1 0 - 7 %    BASOPHIL % 0 0 - 3 %    NEUTROPHIL # 16.56 (H) 1.80 - 8.40 x10^3/uL    LYMPHOCYTE # 1.08 (L) 1.10 - 5.00 x10^3/uL    MONOCYTE # 0.91 0.00 - 1.30 x10^3/uL    EOSINOPHIL # 0.13 0.00 - 0.80 x10^3/uL    BASOPHIL # 0.02 0.00 - 0.30 x10^3/uL   CREATINE KINASE (CK), TOTAL, SERUM OR PLASMA    Collection Time: 03/12/23  2:22 PM   Result Value Ref Range    CREATINE KINASE 92 39 - 308 U/L   AMMONIA    Collection Time: 03/12/23  2:31 PM   Result Value Ref Range    AMMONIA <10 (L) 11 - 32 umol/L   GOLD TOP TUBE    Collection Time: 03/12/23  2:41 PM   Result  Value Ref Range    RAINBOW/EXTRA TUBE AUTO RESULT Yes    GRAY TOP TUBE    Collection Time: 03/12/23  2:41 PM   Result Value Ref Range    RAINBOW/EXTRA TUBE AUTO RESULT Yes    ECG 12 LEAD    Collection Time: 03/12/23  3:09 PM   Result Value Ref Range    Ventricular rate 87 BPM    Atrial Rate 87 BPM    PR Interval 160 ms  QRS Duration 108 ms    QT Interval 398 ms    QTC Calculation 478 ms    Calculated P Axis 5 degrees    Calculated R Axis -65 degrees    Calculated T Axis 24 degrees   BASIC METABOLIC PANEL, NON-FASTING    Collection Time: 03/13/23  4:31 AM   Result Value Ref Range    SODIUM 136 136 - 145 mmol/L    POTASSIUM 3.1 (L) 3.5 - 5.1 mmol/L    CHLORIDE 106 98 - 107 mmol/L    CO2 TOTAL 17 (L) 21 - 31 mmol/L    ANION GAP 13 4 - 13 mmol/L    CALCIUM 8.2 (L) 8.6 - 10.3 mg/dL    GLUCOSE 63 (L) 74 - 109 mg/dL    BUN 40 (H) 7 - 25 mg/dL    CREATININE 1.61 (H) 0.60 - 1.30 mg/dL    BUN/CREA RATIO 10 6 - 22    ESTIMATED GFR 15 (L) >59 mL/min/1.24m^2    OSMOLALITY, CALCULATED 280 270 - 290 mOsm/kg   MAGNESIUM    Collection Time: 03/13/23  4:31 AM   Result Value Ref Range    MAGNESIUM 1.9 1.9 - 2.7 mg/dL   CBC WITH DIFF    Collection Time: 03/13/23  4:31 AM   Result Value Ref Range    WBC 16.7 (H) 3.6 - 10.2 x10^3/uL    RBC 4.14 4.06 - 5.63 x10^6/uL    HGB 11.1 (L) 12.5 - 16.3 g/dL    HCT 09.6 (L) 04.5 - 47.1 %    MCV 83.9 73.0 - 96.2 fL    MCH 26.8 23.8 - 33.4 pg    MCHC 31.9 (L) 32.5 - 36.3 g/dL    RDW 40.9 (H) 81.1 - 16.2 %    PLATELETS 289 140 - 440 x10^3/uL    MPV 8.8 7.4 - 11.4 fL    NEUTROPHIL % 86 (H) 43 - 77 %    LYMPHOCYTE % 7 (L) 16 - 44 %    MONOCYTE % 5 5 - 13 %    EOSINOPHIL % 2 %    BASOPHIL % 1 0 - 1 %    NEUTROPHIL # 14.30 (H) 1.85 - 7.80 x10^3/uL    LYMPHOCYTE # 1.10 1.00 - 3.00 x10^3/uL    MONOCYTE # 0.90 0.30 - 1.00 x10^3/uL    EOSINOPHIL # 0.30 0.00 - 0.50 x10^3/uL    BASOPHIL # 0.10 0.00 - 0.10 x10^3/uL   RETICULOCYTE COUNT    Collection Time: 03/13/23  4:31 AM   Result Value Ref Range     RETICULOCYTE % AUTOMATED 1.07 0.5 - 2.17 %    IMMATURE RETIC FRACTION 0.32 0.29 - 0.53    RETICULOCYTES COUNT # AUTOMATED 0.0443 0.0221 - 0.0963 10(6)/uL   FERRITIN    Collection Time: 03/13/23  4:31 AM   Result Value Ref Range    FERRITIN 86 11 - 336 ng/mL   VITAMIN B12    Collection Time: 03/13/23  4:31 AM   Result Value Ref Range    VITAMIN B 12 221 180 - 914 pg/mL   FOLATE    Collection Time: 03/13/23  4:31 AM   Result Value Ref Range    FOLATE 18.2 5.9 - 24.4 ng/mL   IRON TRANSFERRIN AND TIBC    Collection Time: 03/13/23  4:31 AM   Result Value Ref Range    TOTAL IRON BINDING CAPACITY 190 (L) 250 - 450 ug/dL    IRON (TRANSFERRIN) SATURATION 13 (  L) 20 - 50 %    IRON 25 (L) 50 - 212 ug/dL    TRANSFERRIN 161 (L) 203 - 362 mg/dL    UIBC 096 045 - 409 ug/dL   POC BLOOD GLUCOSE (RESULTS)    Collection Time: 03/13/23  5:55 AM   Result Value Ref Range    GLUCOSE, POC 98 70 - 100 mg/dl       Micro:    No results found for any visits on 03/12/23 (from the past 24 hour(s)).       Imaging:    ECG 12 LEAD  Normal sinus rhythm  Left axis deviation  Inferior infarct , age undetermined  Cannot rule out Anterior infarct , age undetermined  Abnormal ECG  Confirmed by Renae Gloss [phone]) on 03/14/2023 11:23:44 AM         Audrea Muscat, MD  March 16, 2023    I personally spent 50 minutes face-to-face and non-face-to-face in the care of this patient, which includes all pre, intra, and post visit time on the date of service.  All documented time was specific to the E/M visit and does not include any procedures that may have been performed.

## 2023-03-16 NOTE — Nurses Notes (Signed)
Dr. Leonette Most in at bedside to speak with [name], patient's son. Nursing staff present, all questions from son addressed by Dr. Leonette Most at this time.

## 2023-03-16 NOTE — Telemedicine Progress Note (Addendum)
E-Consult Note      [name] [name]  01-17-1961  62 y.o., male  Date of Service: 03/16/2023    Provider that requested consult:        Reason for Consult: Dr. Leonette Most         Relevant Components of Epic chart/labs/radiology reviewed: yes    BMP:   143 (07/07 0921) 103 (07/07 1610) 39* (07/07 9604)    /     144* (07/07 5409)   2.9* (07/07 8119) 26 (07/07 0921) 4.00* (07/07 0921) \         CBC:     26.8* (07/07 0909) \   11.6* (07/07 1478) /   313 (07/07 2956)      / 36.9 (07/07 0909) \          Recommendations:     AKI on CKD 4  -prerenal + urinary retention  -baseline Cr 2.7  -Scr 4.00* (07/07 0921) , stable.  -Flomax  -keep Foley.  -cont LR at 75 cc hour  -UA was reviewed  -U/S reviewed  -no need for RRT  -TP/CR 6.5  -ua/cr 3.0 g  -Serology w/u  -PTH  -vIT D       Electrolytes  -replace K as needed.     ACID-BASE:  -stable      Time Spent: 5 min , reviewing the chart medications x-rays labs and progress and consultant notes, communicating with the hospitalist and the charge nurse time spent more than 35 minutes    MY ORDERS LAST 24 (24h ago, onward)       Start     Ordered    03/16/23 1200  potassium chloride (K-DUR) extended release tablet  ONCE         03/16/23 1039    03/16/23 1200  albumin human (ALBUMINAR) 25% premix infusion  EVERY 12 HOURS         03/16/23 1039    03/16/23 1045  PARATHYROID HORMONE (PTH)  ONE TIME         03/16/23 1039    03/16/23 0915  RENAL FUNCTION PANEL  ONE TIME         03/16/23 0915    03/16/23 0530  ANCA SCREEN WITH REFLEX TO ANCA TITER  ONE TIME         03/15/23 1242    03/16/23 0530  ANTI-DOUBLE STRANDED DNA AB  ONE TIME         03/15/23 1242    03/16/23 0530  ANTI-STREPTOLYSIN O  ONE TIME         03/15/23 1242    03/16/23 0530  C3 COMPLEMENT, SERUM  ONE TIME         03/15/23 1242    03/16/23 0530  C4 COMPLEMENT, SERUM  ONE TIME         03/15/23 1242    03/16/23 0530  HEP-2 SUBSTRATE ANTINUCLEAR ANTIBODIES (ANA), SERUM  ONE TIME         03/15/23 1242    03/16/23 0530  HEPATITIS A IGM  ANTIBODY  (ACUTE HEPATITIS PROFILE )  ONE TIME         03/15/23 1242    03/16/23 0530  HEPATITIS B CORE IGM, AB  (ACUTE HEPATITIS PROFILE )  ONE TIME         03/15/23 1242    03/16/23 0530  HEPATITIS B SURFACE ANTIGEN  (ACUTE HEPATITIS PROFILE )  ONE TIME         03/15/23 1242  03/16/23 0530  HIV 1 AND 2 RAPID SCREEN  ONE TIME         03/15/23 1242    03/16/23 0530  MONOCLONAL GAMMOPATHY PROFILE WITH SPEP, FLC, AND IMMUNOTYPING REFLEX  ONE TIME         03/15/23 1242    03/16/23 0530  SYPHILIS SCREENING ALGORITHM WITH REFLEX, SERUM  ONE TIME         03/15/23 1242    03/16/23 0530  KAPPA AND LAMBDA FREE LIGHT CHAINS, SERUM  PROCEDURE ONCE         03/16/23 0015    03/16/23 0530  MONOCLONAL GAMMOPATHY PROFILE WITH IMMUNOTYPING REFLEX  PROCEDURE ONCE         03/16/23 0015    03/16/23 0530  ALBUMIN FOR ELECTROPHORESIS  PROCEDURE ONCE         03/16/23 0015    03/16/23 0530  PROTEIN FOR ELECTROPHORESIS  PROCEDURE ONCE         03/16/23 0015    03/16/23 0030  HEPATITIS C ANTIBODY SCREEN WITH REFLEX TO HCV PCR  (ACUTE HEPATITIS PROFILE )  ONE TIME,   Status:  Canceled         03/15/23 1242    03/15/23 1800  tamsulosin (FLOMAX) capsule  EVERY EVENING AFTER DINNER         03/15/23 1127    03/15/23 1300  LR premix infusion  CONTINUOUS         03/15/23 1125    03/15/23 1130  C4 COMPLEMENT, SERUM  ONE TIME,   Status:  Canceled         03/15/23 1126    03/15/23 1130  C3 COMPLEMENT, SERUM  ONE TIME,   Status:  Canceled         03/15/23 1126    03/15/23 1130  ANTI-STREPTOLYSIN O  ONE TIME,   Status:  Canceled         03/15/23 1126    03/15/23 1130  ANTI-DOUBLE STRANDED DNA AB  ONE TIME,   Status:  Canceled         03/15/23 1126    03/15/23 1130  ANCA SCREEN WITH REFLEX TO ANCA TITER  ONE TIME,   Status:  Canceled         03/15/23 1126    03/15/23 1130  HEPATITIS A IGM ANTIBODY  (ACUTE HEPATITIS PROFILE )  ONE TIME,   Status:  Canceled         03/15/23 1126    03/15/23 1130  HEPATITIS B SURFACE ANTIGEN  (ACUTE HEPATITIS PROFILE )   ONE TIME,   Status:  Canceled         03/15/23 1126    03/15/23 1130  HEPATITIS B CORE IGM, AB  (ACUTE HEPATITIS PROFILE )  ONE TIME,   Status:  Canceled         03/15/23 1126    03/15/23 1130  HEPATITIS C ANTIBODY SCREEN WITH REFLEX TO HCV PCR  (ACUTE HEPATITIS PROFILE )  ONE TIME,   Status:  Canceled         03/15/23 1126    03/15/23 1130  HIV 1 AND 2 RAPID SCREEN  ONE TIME,   Status:  Canceled         03/15/23 1126    03/15/23 1130  SYPHILIS SCREENING ALGORITHM WITH REFLEX, SERUM  ONE TIME,   Status:  Canceled         03/15/23 1126    03/15/23 1130  DRUG SCREEN, NO  CONFIRMATION, URINE  ONE TIME         03/15/23 1126    03/15/23 1130  HEP-2 SUBSTRATE ANTINUCLEAR ANTIBODIES (ANA), SERUM  ONE TIME,   Status:  Canceled         03/15/23 1126    03/15/23 1130  MONOCLONAL GAMMOPATHY PROFILE WITH SPEP, FLC, AND IMMUNOTYPING REFLEX  ONE TIME,   Status:  Canceled         03/15/23 1126    03/15/23 1130  KAPPA AND LAMBDA FREE LIGHT CHAINS, SERUM  PROCEDURE ONCE,   Status:  Canceled         03/15/23 1127    03/15/23 1130  MONOCLONAL GAMMOPATHY PROFILE WITH IMMUNOTYPING REFLEX  PROCEDURE ONCE,   Status:  Canceled         03/15/23 1127    03/15/23 1130  ALBUMIN FOR ELECTROPHORESIS  PROCEDURE ONCE,   Status:  Canceled         03/15/23 1127    03/15/23 1130  PROTEIN FOR ELECTROPHORESIS  PROCEDURE ONCE,   Status:  Canceled         03/15/23 1127                    [name], MD

## 2023-03-16 NOTE — Nurses Notes (Signed)
Accucheck at this time is 164.

## 2023-03-16 NOTE — Nurses Notes (Signed)
Into patient room, patient had large soft BM and IV site to right arm infiltrated, IV removed catheter intact, attempted x2 to obtain IV access, patient began to pull arm away from nurse stating "I don't want an IV I want to go home." This nurse educated patient regarding need for IV in order to receive medications, patient stated "I don't care." PCT's into patient room to provide incontinence care. Currently denies any other needs at this time bed in lowest position call light in reach bed alarm on and working.

## 2023-03-16 NOTE — Care Plan (Signed)
Problem: Adult Inpatient Plan of Care  Goal: Plan of Care Review  03/16/2023 0603 by Francesco Runner, RN  Outcome: Ongoing (see interventions/notes)  03/15/2023 2149 by Francesco Runner, RN  Outcome: Ongoing (see interventions/notes)  Goal: Patient-Specific Goal (Individualized)  03/16/2023 0603 by Francesco Runner, RN  Outcome: Ongoing (see interventions/notes)  03/15/2023 2149 by Francesco Runner, RN  Outcome: Ongoing (see interventions/notes)  Flowsheets (Taken 03/15/2023 2000)  Individualized Care Needs: assist with feeding, ADL's, monitor vital signs, prn medicaiton  Anxieties, Fears or Concerns: falling  Patient-Specific Goals (Include Timeframe): discharge when criteria met  Plan of Care Reviewed With: patient  Goal: Absence of Hospital-Acquired Illness or Injury  03/16/2023 0603 by Francesco Runner, RN  Outcome: Ongoing (see interventions/notes)  03/15/2023 2149 by Francesco Runner, RN  Outcome: Ongoing (see interventions/notes)  Intervention: Identify and Manage Fall Risk  Recent Flowsheet Documentation  Taken 03/16/2023 0547 by Francesco Runner, RN  Safety Promotion/Fall Prevention:   activity supervised   fall prevention program maintained   nonskid shoes/slippers when out of bed   safety round/check completed  Taken 03/16/2023 0359 by Francesco Runner, RN  Safety Promotion/Fall Prevention:   activity supervised   fall prevention program maintained   nonskid shoes/slippers when out of bed   safety round/check completed  Taken 03/16/2023 0200 by Francesco Runner, RN  Safety Promotion/Fall Prevention:   activity supervised   fall prevention program maintained   nonskid shoes/slippers when out of bed   safety round/check completed  Taken 03/15/2023 2356 by Francesco Runner, RN  Safety Promotion/Fall Prevention:   activity supervised   fall prevention program maintained   nonskid shoes/slippers when out of bed   safety round/check completed  Taken 03/15/2023 2148 by Francesco Runner, RN  Safety Promotion/Fall Prevention:   activity supervised   fall prevention  program maintained   nonskid shoes/slippers when out of bed   safety round/check completed  Taken 03/15/2023 2000 by Francesco Runner, RN  Safety Promotion/Fall Prevention:   activity supervised   fall prevention program maintained   nonskid shoes/slippers when out of bed   safety round/check completed  Intervention: Prevent Skin Injury  Recent Flowsheet Documentation  Taken 03/15/2023 2000 by Francesco Runner, RN  Body Position: supine, head elevated  Intervention: Prevent and Manage VTE (Venous Thromboembolism) Risk  Recent Flowsheet Documentation  Taken 03/15/2023 2000 by Francesco Runner, RN  VTE Prevention/Management: anticoagulant therapy maintained  Goal: Optimal Comfort and Wellbeing  03/16/2023 0603 by Francesco Runner, RN  Outcome: Ongoing (see interventions/notes)  03/15/2023 2149 by Francesco Runner, RN  Outcome: Ongoing (see interventions/notes)  Intervention: Provide Person-Centered Care  Recent Flowsheet Documentation  Taken 03/15/2023 2000 by Francesco Runner, RN  Trust Relationship/Rapport:   care explained   choices provided   emotional support provided  Goal: Rounds/Family Conference  03/16/2023 0603 by Francesco Runner, RN  Outcome: Ongoing (see interventions/notes)  03/15/2023 2149 by Francesco Runner, RN  Outcome: Ongoing (see interventions/notes)     Problem: Health Knowledge, Opportunity to Enhance (Adult,Obstetrics,Pediatric)  Goal: Knowledgeable about Health Subject/Topic  Description: Patient will demonstrate the desired outcomes by discharge/transition of care.  03/16/2023 0603 by Francesco Runner, RN  Outcome: Ongoing (see interventions/notes)  03/15/2023 2149 by Francesco Runner, RN  Outcome: Ongoing (see interventions/notes)     Problem: Fall Injury Risk  Goal: Absence of Fall and Fall-Related Injury  03/16/2023 0603 by Francesco Runner, RN  Outcome: Ongoing (see interventions/notes)  03/15/2023 2149 by Francesco Runner, RN  Outcome: Ongoing (see interventions/notes)  Intervention: Promote Injury-Free Environment  Recent  Flowsheet  Documentation  Taken 03/16/2023 0547 by Francesco Runner, RN  Safety Promotion/Fall Prevention:   activity supervised   fall prevention program maintained   nonskid shoes/slippers when out of bed   safety round/check completed  Taken 03/16/2023 0359 by Francesco Runner, RN  Safety Promotion/Fall Prevention:   activity supervised   fall prevention program maintained   nonskid shoes/slippers when out of bed   safety round/check completed  Taken 03/16/2023 0200 by Francesco Runner, RN  Safety Promotion/Fall Prevention:   activity supervised   fall prevention program maintained   nonskid shoes/slippers when out of bed   safety round/check completed  Taken 03/15/2023 2356 by Francesco Runner, RN  Safety Promotion/Fall Prevention:   activity supervised   fall prevention program maintained   nonskid shoes/slippers when out of bed   safety round/check completed  Taken 03/15/2023 2148 by Francesco Runner, RN  Safety Promotion/Fall Prevention:   activity supervised   fall prevention program maintained   nonskid shoes/slippers when out of bed   safety round/check completed  Taken 03/15/2023 2000 by Francesco Runner, RN  Safety Promotion/Fall Prevention:   activity supervised   fall prevention program maintained   nonskid shoes/slippers when out of bed   safety round/check completed     Problem: Skin Injury Risk Increased  Goal: Skin Health and Integrity  03/16/2023 0603 by Francesco Runner, RN  Outcome: Ongoing (see interventions/notes)  03/15/2023 2149 by Francesco Runner, RN  Outcome: Ongoing (see interventions/notes)  Intervention: Optimize Skin Protection  Recent Flowsheet Documentation  Taken 03/15/2023 2000 by Francesco Runner, RN  Activity Management: ROM, active encouraged

## 2023-03-17 DIAGNOSIS — Z7401 Bed confinement status: Secondary | ICD-10-CM

## 2023-03-17 DIAGNOSIS — N179 Acute kidney failure, unspecified: Secondary | ICD-10-CM

## 2023-03-17 DIAGNOSIS — R339 Retention of urine, unspecified: Secondary | ICD-10-CM

## 2023-03-17 DIAGNOSIS — N184 Chronic kidney disease, stage 4 (severe): Secondary | ICD-10-CM

## 2023-03-17 LAB — CBC WITH DIFF
BASOPHIL #: 0 10*3/uL (ref 0.00–0.10)
BASOPHIL %: 0 % (ref 0–1)
EOSINOPHIL #: 0 10*3/uL (ref 0.00–0.50)
EOSINOPHIL %: 0 % — ABNORMAL LOW
HCT: 30.5 % — ABNORMAL LOW (ref 36.7–47.1)
HGB: 9.7 g/dL — ABNORMAL LOW (ref 12.5–16.3)
LYMPHOCYTE #: 0.4 10*3/uL — ABNORMAL LOW (ref 1.00–3.00)
LYMPHOCYTE %: 2 % — ABNORMAL LOW (ref 16–44)
MCH: 26.8 pg (ref 23.8–33.4)
MCHC: 31.8 g/dL — ABNORMAL LOW (ref 32.5–36.3)
MCV: 84.4 fL (ref 73.0–96.2)
MONOCYTE #: 0.1 10*3/uL — ABNORMAL LOW (ref 0.30–1.00)
MONOCYTE %: 1 % — ABNORMAL LOW (ref 5–13)
MPV: 8.6 fL (ref 7.4–11.4)
NEUTROPHIL #: 18.6 10*3/uL — ABNORMAL HIGH (ref 1.85–7.80)
NEUTROPHIL %: 97 % — ABNORMAL HIGH (ref 43–77)
PLATELETS: 233 10*3/uL (ref 140–440)
RBC: 3.62 10*6/uL — ABNORMAL LOW (ref 4.06–5.63)
RDW: 17.1 % — ABNORMAL HIGH (ref 12.1–16.2)
WBC: 19.2 10*3/uL — ABNORMAL HIGH (ref 3.6–10.2)

## 2023-03-17 LAB — BASIC METABOLIC PANEL
ANION GAP: 12 mmol/L (ref 4–13)
BUN/CREA RATIO: 13 (ref 6–22)
BUN: 44 mg/dL — ABNORMAL HIGH (ref 7–25)
CALCIUM: 7.9 mg/dL — ABNORMAL LOW (ref 8.6–10.3)
CHLORIDE: 108 mmol/L — ABNORMAL HIGH (ref 98–107)
CO2 TOTAL: 20 mmol/L — ABNORMAL LOW (ref 21–31)
CREATININE: 3.51 mg/dL — ABNORMAL HIGH (ref 0.60–1.30)
ESTIMATED GFR: 19 mL/min/{1.73_m2} — ABNORMAL LOW (ref >59)
GLUCOSE: 285 mg/dL — ABNORMAL HIGH (ref 74–109)
OSMOLALITY, CALCULATED: 301 mOsm/kg — ABNORMAL HIGH (ref 270–290)
POTASSIUM: 4.5 mmol/L (ref 3.5–5.1)
SODIUM: 140 mmol/L (ref 136–145)

## 2023-03-17 LAB — ECG 12 LEAD
Atrial Rate: 87 {beats}/min
Calculated P Axis: 32 degrees
Calculated R Axis: -63 degrees
Calculated T Axis: 37 degrees
PR Interval: 172 ms
QRS Duration: 104 ms
QT Interval: 434 ms
QTC Calculation: 522 ms
Ventricular rate: 87 {beats}/min

## 2023-03-17 LAB — POC BLOOD GLUCOSE (RESULTS)
GLUCOSE, POC: 288 mg/dl — ABNORMAL HIGH (ref 70–100)
GLUCOSE, POC: 268 mg/dl — ABNORMAL HIGH (ref 70–100)
GLUCOSE, POC: 299 mg/dl — ABNORMAL HIGH (ref 70–100)
GLUCOSE, POC: 345 mg/dl — ABNORMAL HIGH (ref 70–100)

## 2023-03-17 LAB — SCAN DIFFERENTIAL: PLATELET MORPHOLOGY COMMENT: NORMAL

## 2023-03-17 LAB — MAGNESIUM: MAGNESIUM: 1.8 mg/dL — ABNORMAL LOW (ref 1.9–2.7)

## 2023-03-17 MED ORDER — WATER FOR INJECTION, STERILE INTRAVENOUS SOLUTION
INTRAVENOUS | Status: AC
Start: 2023-03-17 — End: ?
  Filled 2023-03-17 (×4): qty 1000

## 2023-03-17 NOTE — PT Treatment (Signed)
[facility]  [address]  [place], [address]  [phone]  (Fax) [phone]  Rehabilitation Services  Physical Therapy     Patient Name: [name] [name]  Date of Birth: 05-19-1961  Height: Height: 180.3 cm (5\' 11" )  Weight: Weight: 93.5 kg (206 lb 1 oz)  Room/Bed: 368/A  Payor: [employer] MEDICARE / Plan: [employer] PPO / Product Type: PPO /     PATIENT DID NOT PARTICIPATE IN THERAPY TODAY DUE TO: PATIENT DECLINED TREATMENT        Lane Hacker, PTA 03/17/2023,12:27

## 2023-03-17 NOTE — Progress Notes (Signed)
[place] [facility]ICINE [facility]  Nephrology Consult Progress Note           [name], [name]  Date of Admission:  03/12/2023  Date of Birth:  Mar 23, 1961  Date of Service:  03/17/2023    Hospital Day:  LOS: 3 days       HPI/Subjective:   [name] [name] is a 62 y.o. male with chronic kidney disease stage 4 presented to the hospital with acute on chronic due to urine retention prerenal.    Patient does not have edema today.  No shortness of breath.  He feels little bit better than yesterday.  Review of Systems  Systematic review of 12 organ systems was negative except what mentioned in in the HPI.  Vital Signs:  Temp (24hrs) Max:36.8 C (98.2 F)      Temperature: 36.2 C (97.2 F)  BP (Non-Invasive): (!) 159/77  MAP (Non-Invasive): 101 mmHG  Heart Rate: 63  Respiratory Rate: 18  SpO2: 95 %  I/O:  I/O last 24 hours:    Intake/Output Summary (Last 24 hours) at 03/17/2023 1248  Last data filed at 03/17/2023 1024  Gross per 24 hour   Intake 850 ml   Output 3050 ml   Net -2200 ml     Examination  Patient is alert awake and oriented not in acute distress.  Normal mood and affect.  HEENT normocephalic atraumatic.  Eye exam normal inspection.   Mucous membranes moist, no jaundice.  Neck exam no JVD normal inspection.  Cardiovascular system: Regular rate and rhythm no murmurs rubs or gallops. No chest wall tenderness  Lungs: Clear to auscultation bilaterally no wheezing no rhonchi.  Abdomen soft nontender nondistended.  Extremities no clubbing cyanosis or edema.  Neuro exam: EOMI, normal speech  Current Medications:  acetaminophen (TYLENOL) tablet, 650 mg, Oral, Q4H PRN  albuterol (PROVENTIL) 2.5 mg / 3 mL (0.083%) neb solution, 2.5 mg, Nebulization, Q4H PRN  alcohol 62 % (NOZIN NASAL SANITIZER) nasal solution, 1 Each, Each Nostril, 2x/day  aluminum-magnesium hydroxide-simethicone (MAG-AL PLUS) 200-200-20 mg per 5 mL oral liquid, 15 mL, Oral, 4x/day PRN  aspirin tablet 325 mg, 325 mg, Oral, Daily  buprenorphine-naloxone (SUBOXONE)  2-0.5mg  per sublingual tablet, 1 Tablet, Sublingual, 2x/day  collagenase (SANTYL) 250 unit/gm ointment, , Apply Topically, Daily  Correction/SSIP insulin lispro (HumaLOG) 100 units/mL injection, 0-18 Units, Subcutaneous, 4x/day AC  dextrose (GLUTOSE) 40% oral gel, 15 g, Oral, Q15 Min PRN  dextrose 50% (0.5 g/mL) injection - syringe, 12.5 g, Intravenous, Q15 Min PRN  diphenhydrAMINE (BENADRYL) 50 mg/mL injection, 25 mg, Intravenous, Q6H PRN  docusate sodium (COLACE) capsule, 100 mg, Oral, 2x/day  famotidine (PEPCID) 10 mg/mL injection, 10 mg, Intravenous, 2x/day  glucagon (GLUCAGEN DIAGNOSTIC KIT) injection 1 mg, 1 mg, IntraMUSCULAR, Once PRN  guaiFENesin 100mg  per 5mL oral liquid - for cough (expectorant), 200 mg, Oral, Q4H PRN  haloperidol (HALDOL) 5 mg/mL injection, 5 mg, Intravenous, Q6H PRN  heparin 5,000 unit/mL injection, 5,000 Units, Subcutaneous, Q12H  hydrALAZINE (APRESOLINE) injection 10 mg, 10 mg, Intravenous, Q4H PRN  LORazepam (ATIVAN) 2 mg/mL injection, 0.5 mg, Intravenous, Q4H PRN  LR premix infusion, , Intravenous, Continuous  nitroGLYCERIN (NITROSTAT) sublingual tablet, 0.4 mg, Sublingual, Q5 Min PRN  ondansetron (ZOFRAN) 2 mg/mL injection, 4 mg, Intravenous, Q8H PRN  polyethylene glycol (MIRALAX) oral packet, 17 g, Oral, Daily PRN  tamsulosin (FLOMAX) capsule, 0.4 mg, Oral, Daily after Dinner  traZODone (DESYREL) tablet, 50 mg, Oral, HS PRN      Labs:  BMP:  140 (07/08 0404) 108* (07/08 0404) 44* (07/08 0404)    /     285* (07/08 0404)   4.5 (07/08 0404) 20* (07/08 0404) 3.51* (07/08 0404) \             CBC:     19.2* (07/08 0404) \   9.7* (07/08 0404) /   233 (07/08 0404)      / 30.5* (07/08 0404) \          Microbiology:  Hospital Encounter on 03/12/23 (from the past 96 hour(s))   OCCULT BLOOD, STOOL (IFOB)    Collection Time: 03/14/23  9:26 AM    Specimen: Stool   Culture Result Status    IFOB Positive (A) Final     Imaging:   ECG 12 LEAD  Normal sinus rhythm  Left axis deviation  Low voltage  QRS  Inferior infarct (cited on or before 12-Mar-2023)  Prolonged QT  Abnormal ECG  When compared with ECG of 12-Mar-2023 15:09,  No significant change was found  Confirmed by Earnie Larsson ([id number]) on 03/17/2023 10:25:18 AM    Assessment/ Plan:   Active Hospital Problems   (*Primary Problem)    Diagnosis    *AMS (altered mental status)       Chronic kidney disease stage 4, acute on chronic kidney disease  -obstructive uropathy/retention plus prerenal  -keep Foley  -keep Flomax  -keep IV fluids, started bicarb drip  -serology workup in progress  -follow up PTH and vitamin-D  -kidney function is improving  -no need for dialysis    Acid-base , start sodium bicarbonate drip  Electrolytes , continue to monitor and replace as needed      [name], MD, FASN, 03/17/2023, 12:48

## 2023-03-17 NOTE — Progress Notes (Signed)
[place] MEDICINE [facility]     HOSPITALIST PROGRESS NOTE    Assessment/Plan:    [name] [name] is a 62 y.o. male who presented to [facility] with AMS (altered mental status).    Concern for angioedema  Tongue appears significantly swollen with compromise speech   Airway remains patent and he does not require supplemental oxygen  Chest is clear to auscultation bilaterally  VSS  Unknown etiology/offending agent  Suspect Levaquin; added to allergy list  Responding well to Decadron and Pepcid  Symptoms almost completely resolved 7/8    Steroid induced leukocytosis   Secondary to use of Decadron   Angioedema improving with Decadron     Acute metabolic encephalopathy, resolved   Auditory and visual hallucination, resolved   Multifactorial:  Uremia, narcotics  Mentation is now back to baseline       Acute renal failure   Probably baseline CKD Stage 3  Metabolic acidemia  Multifactorial:  Prerenal Azotemia, adverse effects of meds   Start bicarb drip   Renal ultrasound WNL  Avoid all nephrotoxins  Nephrology consult greatly appreciated    Opiate use disorder   On Suboxone 3 times daily (held)  Restart Suboxone b.i.d.    Diabetes with hyperglycemia & Hypoglycemia  Steroid induced hyperglycemia  A1c of 6.1 on 03/14/2023  Continue insulin therapy    Impaired mobility and ADLs  PT evaluation      Code Status: Full Code    VTE prophylaxis: HSQ    Disposition: TBD       LOS: 3 days     Subjective     Patient seen and examined at bedside.   Patient was awake, alert and oriented x3   Angioedema has significantly improved  Mentation is back to baseline  Renal function also showing some improvement   Continue to monitor closely    Case discussed with the son on the phone this morning    Not stable for DC       Objective     Vital signs in last 24 hours:  Filed Vitals:    03/16/23 2246 03/17/23 0005 03/17/23 0350 03/17/23 0750   BP:  (!) 154/84 (!) 144/78 (!) 159/77   Pulse: 83 74 78 70   Resp:  17 18 18    Temp:  36.8  C (98.2 F) 36.3 C (97.4 F) 36.2 C (97.2 F)   SpO2:  91% 91% 95%       Intake/Output last 3 shifts:    Intake/Output Summary (Last 24 hours) at 03/17/2023 1145  Last data filed at 03/17/2023 1024  Gross per 24 hour   Intake 880 ml   Output 3050 ml   Net -2170 ml        Physical Exam:    General: Well-developed, well-nourished.  Pleasantly confused  Cardiovascular:  Regular rate, normal rhythm, S1 and S2 present, no murmur, no gallop, no rub, normal peripheral perfusion  Respiratory: Clear to auscultation bilaterally, bilateral breath sounds, normal effort  Gastrointestinal: Soft, benign, nondistended, nontender, no mass  Extremities: No gross deformity, no cyanosis or edema  Skin: Warm, dry; no rashes or lesions    In-Hospital Medications:    Current Facility-Administered Medications   Medication Dose Route Frequency    acetaminophen (TYLENOL) tablet  650 mg Oral Q4H PRN    albuterol (PROVENTIL) 2.5 mg / 3 mL (0.083%) neb solution  2.5 mg Nebulization Q4H PRN    alcohol 62 % (NOZIN NASAL SANITIZER) nasal solution  1 Each  Each Nostril 2x/day    aluminum-magnesium hydroxide-simethicone (MAG-AL PLUS) 200-200-20 mg per 5 mL oral liquid  15 mL Oral 4x/day PRN    aspirin tablet 325 mg  325 mg Oral Daily    buprenorphine-naloxone (SUBOXONE) 2-0.5mg  per sublingual tablet  1 Tablet Sublingual 2x/day    collagenase (SANTYL) 250 unit/gm ointment   Apply Topically Daily    Correction/SSIP insulin lispro (HumaLOG) 100 units/mL injection  0-18 Units Subcutaneous 4x/day AC    dextrose (GLUTOSE) 40% oral gel  15 g Oral Q15 Min PRN    dextrose 50% (0.5 g/mL) injection - syringe  12.5 g Intravenous Q15 Min PRN    diphenhydrAMINE (BENADRYL) 50 mg/mL injection  25 mg Intravenous Q6H PRN    docusate sodium (COLACE) capsule  100 mg Oral 2x/day    famotidine (PEPCID) 10 mg/mL injection  10 mg Intravenous 2x/day    glucagon (GLUCAGEN DIAGNOSTIC KIT) injection 1 mg  1 mg IntraMUSCULAR Once PRN    guaiFENesin 100mg  per 5mL oral liquid - for  cough (expectorant)  200 mg Oral Q4H PRN    haloperidol (HALDOL) 5 mg/mL injection  5 mg Intravenous Q6H PRN    heparin 5,000 unit/mL injection  5,000 Units Subcutaneous Q12H    hydrALAZINE (APRESOLINE) injection 10 mg  10 mg Intravenous Q4H PRN    LORazepam (ATIVAN) 2 mg/mL injection  0.5 mg Intravenous Q4H PRN    LR premix infusion   Intravenous Continuous    nitroGLYCERIN (NITROSTAT) sublingual tablet  0.4 mg Sublingual Q5 Min PRN    ondansetron (ZOFRAN) 2 mg/mL injection  4 mg Intravenous Q8H PRN    polyethylene glycol (MIRALAX) oral packet  17 g Oral Daily PRN    tamsulosin (FLOMAX) capsule  0.4 mg Oral Daily after Dinner    traZODone (DESYREL) tablet  50 mg Oral HS PRN          Labs:    Results for orders placed or performed during the hospital encounter of 03/12/23 (from the past 24 hour(s))   URINE DRUG SCREEN    Collection Time: 03/12/23  1:22 PM   Result Value Ref Range    AMPHETAMINES URINE Negative Negative    BARBITURATES URINE Negative Negative    BENZODIAZEPINES URINE Negative Negative    METHADONE URINE Negative Negative    COCAINE METABOLITES URINE Negative Negative    OPIATES URINE Negative Negative    PCP URINE Negative Negative    CANNABINOIDS URINE Negative Negative   URINALYSIS, MACRO/MICRO    Collection Time: 03/12/23  1:22 PM   Result Value Ref Range    COLOR Light Yellow (A) Yellow    APPEARANCE Slightly Hazy (A) Clear    SPECIFIC GRAVITY 1.025 1.003 - 1.035    PH 6.0 4.6 - 8.0    LEUKOCYTES Negative Negative WBCs/uL    NITRITE Negative Negative    PROTEIN >= 300 (A) Negative mg/dL    GLUCOSE 161 (A) Negative mg/dL    KETONES Trace (A) Negative mg/dL    BILIRUBIN Negative Negative mg/dL    BLOOD Moderate (A) Negative mg/dL    UROBILINOGEN 0.2 0.2 - 1.0 mg/dL   URINALYSIS, MICROSCOPIC    Collection Time: 03/12/23  1:22 PM   Result Value Ref Range    RBCS 11-15 (A) 0-3, Not Present /hpf    BACTERIA Few (A) Negative /hpf    MUCOUS Few (A) (none) /hpf    WBCS 0-5 Not Present, Occasional, 0-5  /hpf    SQUAMOUS EPITHELIAL Few Not  Present, Few /hpf   COMPREHENSIVE METABOLIC PANEL, NON-FASTING    Collection Time: 03/12/23  1:43 PM   Result Value Ref Range    SODIUM 139 136 - 145 mmol/L    POTASSIUM 3.2 (L) 3.5 - 5.1 mmol/L    CHLORIDE 103 98 - 107 mmol/L    CO2 TOTAL 22 21 - 32 mmol/L    ANION GAP 14 (H) 4 - 13 mmol/L    BUN 41 (H) 7 - 18 mg/dL    CREATININE 1.61 (H) 0.70 - 1.30 mg/dL    BUN/CREA RATIO 9     ESTIMATED GFR 13 (L) >59 mL/min/1.63m^2    ALBUMIN 2.3 (L) 3.4 - 5.0 g/dL    CALCIUM 8.4 (L) 8.5 - 10.1 mg/dL    GLUCOSE 90 74 - 096 mg/dL    ALKALINE PHOSPHATASE 86 46 - 116 U/L    ALT (SGPT) 12 <=78 U/L    AST (SGOT) 13 (L) 15 - 37 U/L    BILIRUBIN TOTAL 0.5 0.2 - 1.0 mg/dL    PROTEIN TOTAL 7.6 6.4 - 8.2 g/dL    ALBUMIN/GLOBULIN RATIO 0.4 (L) 0.8 - 1.4    OSMOLALITY, CALCULATED 287 270 - 290 mOsm/kg    CALCIUM, CORRECTED 9.8 mg/dL    GLOBULIN 5.3    CBC WITH DIFF    Collection Time: 03/12/23  1:43 PM   Result Value Ref Range    WBC 18.7 (H) 4.0 - 10.5 x10^3/uL    RBC 4.31 4.20 - 6.00 x10^6/uL    HGB 11.8 (L) 13.5 - 18.0 g/dL    HCT 04.5 (L) 40.9 - 51.0 %    MCV 86.2 78.0 - 99.0 fL    MCH 27.5 27.0 - 32.0 pg    MCHC 31.8 (L) 32.0 - 36.0 g/dL    RDW 81.1 (H) 91.4 - 14.8 %    PLATELETS 349 140 - 440 x10^3/uL    MPV 8.1 7.4 - 10.4 fL    NEUTROPHIL % 89 (H) 40 - 76 %    LYMPHOCYTE % 6 (L) 25 - 45 %    MONOCYTE % 5 0 - 12 %    EOSINOPHIL % 1 0 - 7 %    BASOPHIL % 0 0 - 3 %    NEUTROPHIL # 16.56 (H) 1.80 - 8.40 x10^3/uL    LYMPHOCYTE # 1.08 (L) 1.10 - 5.00 x10^3/uL    MONOCYTE # 0.91 0.00 - 1.30 x10^3/uL    EOSINOPHIL # 0.13 0.00 - 0.80 x10^3/uL    BASOPHIL # 0.02 0.00 - 0.30 x10^3/uL   CREATINE KINASE (CK), TOTAL, SERUM OR PLASMA    Collection Time: 03/12/23  2:22 PM   Result Value Ref Range    CREATINE KINASE 92 39 - 308 U/L   AMMONIA    Collection Time: 03/12/23  2:31 PM   Result Value Ref Range    AMMONIA <10 (L) 11 - 32 umol/L   GOLD TOP TUBE    Collection Time: 03/12/23  2:41 PM   Result Value Ref Range     RAINBOW/EXTRA TUBE AUTO RESULT Yes    GRAY TOP TUBE    Collection Time: 03/12/23  2:41 PM   Result Value Ref Range    RAINBOW/EXTRA TUBE AUTO RESULT Yes    ECG 12 LEAD    Collection Time: 03/12/23  3:09 PM   Result Value Ref Range    Ventricular rate 87 BPM    Atrial Rate 87 BPM    PR Interval 160  ms    QRS Duration 108 ms    QT Interval 398 ms    QTC Calculation 478 ms    Calculated P Axis 5 degrees    Calculated R Axis -65 degrees    Calculated T Axis 24 degrees   BASIC METABOLIC PANEL, NON-FASTING    Collection Time: 03/13/23  4:31 AM   Result Value Ref Range    SODIUM 136 136 - 145 mmol/L    POTASSIUM 3.1 (L) 3.5 - 5.1 mmol/L    CHLORIDE 106 98 - 107 mmol/L    CO2 TOTAL 17 (L) 21 - 31 mmol/L    ANION GAP 13 4 - 13 mmol/L    CALCIUM 8.2 (L) 8.6 - 10.3 mg/dL    GLUCOSE 63 (L) 74 - 109 mg/dL    BUN 40 (H) 7 - 25 mg/dL    CREATININE 9.14 (H) 0.60 - 1.30 mg/dL    BUN/CREA RATIO 10 6 - 22    ESTIMATED GFR 15 (L) >59 mL/min/1.57m^2    OSMOLALITY, CALCULATED 280 270 - 290 mOsm/kg   MAGNESIUM    Collection Time: 03/13/23  4:31 AM   Result Value Ref Range    MAGNESIUM 1.9 1.9 - 2.7 mg/dL   CBC WITH DIFF    Collection Time: 03/13/23  4:31 AM   Result Value Ref Range    WBC 16.7 (H) 3.6 - 10.2 x10^3/uL    RBC 4.14 4.06 - 5.63 x10^6/uL    HGB 11.1 (L) 12.5 - 16.3 g/dL    HCT 78.2 (L) 95.6 - 47.1 %    MCV 83.9 73.0 - 96.2 fL    MCH 26.8 23.8 - 33.4 pg    MCHC 31.9 (L) 32.5 - 36.3 g/dL    RDW 21.3 (H) 08.6 - 16.2 %    PLATELETS 289 140 - 440 x10^3/uL    MPV 8.8 7.4 - 11.4 fL    NEUTROPHIL % 86 (H) 43 - 77 %    LYMPHOCYTE % 7 (L) 16 - 44 %    MONOCYTE % 5 5 - 13 %    EOSINOPHIL % 2 %    BASOPHIL % 1 0 - 1 %    NEUTROPHIL # 14.30 (H) 1.85 - 7.80 x10^3/uL    LYMPHOCYTE # 1.10 1.00 - 3.00 x10^3/uL    MONOCYTE # 0.90 0.30 - 1.00 x10^3/uL    EOSINOPHIL # 0.30 0.00 - 0.50 x10^3/uL    BASOPHIL # 0.10 0.00 - 0.10 x10^3/uL   RETICULOCYTE COUNT    Collection Time: 03/13/23  4:31 AM   Result Value Ref Range    RETICULOCYTE % AUTOMATED  1.07 0.5 - 2.17 %    IMMATURE RETIC FRACTION 0.32 0.29 - 0.53    RETICULOCYTES COUNT # AUTOMATED 0.0443 0.0221 - 0.0963 10(6)/uL   FERRITIN    Collection Time: 03/13/23  4:31 AM   Result Value Ref Range    FERRITIN 86 11 - 336 ng/mL   VITAMIN B12    Collection Time: 03/13/23  4:31 AM   Result Value Ref Range    VITAMIN B 12 221 180 - 914 pg/mL   FOLATE    Collection Time: 03/13/23  4:31 AM   Result Value Ref Range    FOLATE 18.2 5.9 - 24.4 ng/mL   IRON TRANSFERRIN AND TIBC    Collection Time: 03/13/23  4:31 AM   Result Value Ref Range    TOTAL IRON BINDING CAPACITY 190 (L) 250 - 450 ug/dL  IRON (TRANSFERRIN) SATURATION 13 (L) 20 - 50 %    IRON 25 (L) 50 - 212 ug/dL    TRANSFERRIN 782 (L) 203 - 362 mg/dL    UIBC 956 213 - 086 ug/dL   POC BLOOD GLUCOSE (RESULTS)    Collection Time: 03/13/23  5:55 AM   Result Value Ref Range    GLUCOSE, POC 98 70 - 100 mg/dl       Micro:    No results found for any visits on 03/12/23 (from the past 24 hour(s)).       Imaging:    ECG 12 LEAD  Normal sinus rhythm  Left axis deviation  Low voltage QRS  Inferior infarct (cited on or before 12-Mar-2023)  Prolonged QT  Abnormal ECG  When compared with ECG of 12-Mar-2023 15:09,  No significant change was found  Confirmed by Earnie Larsson [phone]) on 03/17/2023 10:25:18 AM         Audrea Muscat, MD  March 17, 2023    I personally spent 50 minutes face-to-face and non-face-to-face in the care of this patient, which includes all pre, intra, and post visit time on the date of service.  All documented time was specific to the E/M visit and does not include any procedures that may have been performed.

## 2023-03-17 NOTE — Care Plan (Signed)
Problem: Adult Inpatient Plan of Care  Goal: Plan of Care Review  Outcome: Ongoing (see interventions/notes)  Goal: Absence of Hospital-Acquired Illness or Injury  Outcome: Ongoing (see interventions/notes)  Intervention: Identify and Manage Fall Risk  Recent Flowsheet Documentation  Taken 03/16/2023 1915 by Jeannetta Ellis, RN  Safety Promotion/Fall Prevention:   fall prevention program maintained   activity supervised   motion sensor pad activated   safety round/check completed  Intervention: Prevent Skin Injury  Recent Flowsheet Documentation  Taken 03/17/2023 0200 by Jeannetta Ellis, RN  Body Position: supine, head elevated  Taken 03/17/2023 0000 by Jeannetta Ellis, RN  Body Position:   supine, head elevated   side lying, left  Taken 03/16/2023 2200 by Jeannetta Ellis, RN  Body Position: supine, head elevated  Taken 03/16/2023 2000 by Jeannetta Ellis, RN  Body Position: supine, head elevated  Taken 03/16/2023 1915 by Jeannetta Ellis, RN  Body Position: supine, head elevated  Skin Protection:   adhesive use limited   incontinence pads utilized   tubing/devices free from skin contact  Intervention: Prevent and Manage VTE (Venous Thromboembolism) Risk  Recent Flowsheet Documentation  Taken 03/16/2023 1915 by Jeannetta Ellis, RN  VTE Prevention/Management: anticoagulant therapy maintained  Intervention: Prevent Infection  Recent Flowsheet Documentation  Taken 03/16/2023 1915 by Jeannetta Ellis, RN  Infection Prevention:   promote handwashing   personal protective equipment utilized   equipment surfaces disinfected  Goal: Optimal Comfort and Wellbeing  Outcome: Ongoing (see interventions/notes)  Goal: Rounds/Family Conference  Outcome: Ongoing (see interventions/notes)     Problem: Health Knowledge, Opportunity to Enhance (Adult,Obstetrics,Pediatric)  Goal: Knowledgeable about Health Subject/Topic  Description: Patient will demonstrate the desired outcomes by discharge/transition of care.  Outcome: Ongoing (see interventions/notes)      Problem: Fall Injury Risk  Goal: Absence of Fall and Fall-Related Injury  Outcome: Ongoing (see interventions/notes)  Intervention: Promote Injury-Free Environment  Recent Flowsheet Documentation  Taken 03/16/2023 1915 by Jeannetta Ellis, RN  Safety Promotion/Fall Prevention:   fall prevention program maintained   activity supervised   motion sensor pad activated   safety round/check completed     Problem: Skin Injury Risk Increased  Goal: Skin Health and Integrity  Outcome: Ongoing (see interventions/notes)  Intervention: Optimize Skin Protection  Recent Flowsheet Documentation  Taken 03/16/2023 1915 by Jeannetta Ellis, RN  Pressure Reduction Techniques:   Heels elevated off of the bed   Patient turned q 2 hours   Frequent weight shifting encouraged  Pressure Reduction Devices:   Repositioning wedges/pillows utilized   Heel offloading device utilized  Skin Protection:   adhesive use limited   incontinence pads utilized   tubing/devices free from skin contact  Head of Bed (HOB) Positioning: HOB at 30-45 degrees

## 2023-03-18 DIAGNOSIS — N139 Obstructive and reflux uropathy, unspecified: Secondary | ICD-10-CM

## 2023-03-18 DIAGNOSIS — D72829 Elevated white blood cell count, unspecified: Secondary | ICD-10-CM

## 2023-03-18 LAB — MONOCLONAL GAMMOPATHY PROFILE WITH IMMUNOTYPING REFLEX
ALBUMIN: 2.4 g/dL — ABNORMAL LOW (ref 3.4–4.8)
KAPPA FREE LIGHT CHAINS: 11.15 mg/dL — ABNORMAL HIGH (ref 1.25–3.25)
KAPPA/LAMBDA FLC RATIO: 0.88 (ref 0.80–2.10)
LAMBDA FREE LIGHT CHAINS: 12.7 mg/dL — ABNORMAL HIGH (ref 0.60–2.70)
TOTAL PROTEIN: 6.2 g/dL

## 2023-03-18 LAB — SCAN DIFFERENTIAL: PLATELET MORPHOLOGY COMMENT: NORMAL

## 2023-03-18 LAB — ALBUMIN FOR ELECTROPHORESIS: ALBUMIN: 2.4 g/dL — ABNORMAL LOW (ref 3.4–4.8)

## 2023-03-18 LAB — HEP-2 SUBSTRATE ANTINUCLEAR ANTIBODIES (ANA), SERUM: ANA INTERPRETATION: NEGATIVE

## 2023-03-18 LAB — CBC WITH DIFF
BASOPHIL #: 0.1 10*3/uL (ref 0.00–0.10)
BASOPHIL %: 0 % (ref 0–1)
EOSINOPHIL #: 0 10*3/uL (ref 0.00–0.50)
EOSINOPHIL %: 0 % — ABNORMAL LOW
HCT: 31.9 % — ABNORMAL LOW (ref 36.7–47.1)
HGB: 10.1 g/dL — ABNORMAL LOW (ref 12.5–16.3)
LYMPHOCYTE #: 0.6 10*3/uL — ABNORMAL LOW (ref 1.00–3.00)
LYMPHOCYTE %: 2 % — ABNORMAL LOW (ref 16–44)
MCH: 26.6 pg (ref 23.8–33.4)
MCHC: 31.7 g/dL — ABNORMAL LOW (ref 32.5–36.3)
MCV: 83.9 fL (ref 73.0–96.2)
MONOCYTE #: 1.3 10*3/uL — ABNORMAL HIGH (ref 0.30–1.00)
MONOCYTE %: 5 % (ref 5–13)
MPV: 8.7 fL (ref 7.4–11.4)
NEUTROPHIL #: 24.6 10*3/uL — ABNORMAL HIGH (ref 1.85–7.80)
NEUTROPHIL %: 92 % — ABNORMAL HIGH (ref 43–77)
PLATELETS: 256 10*3/uL (ref 140–440)
RBC: 3.81 10*6/uL — ABNORMAL LOW (ref 4.06–5.63)
RDW: 17.3 % — ABNORMAL HIGH (ref 12.1–16.2)
WBC: 26.7 10*3/uL — ABNORMAL HIGH (ref 3.6–10.2)

## 2023-03-18 LAB — KAPPA AND LAMBDA FREE LIGHT CHAINS, SERUM
KAPPA FREE LIGHT CHAINS: 11.15 mg/dL — ABNORMAL HIGH (ref 1.25–3.25)
KAPPA/LAMBDA FLC RATIO: 0.88 (ref 0.80–2.10)
LAMBDA FREE LIGHT CHAINS: 12.7 mg/dL — ABNORMAL HIGH (ref 0.60–2.70)

## 2023-03-18 LAB — BASIC METABOLIC PANEL
ANION GAP: 10 mmol/L (ref 4–13)
BUN/CREA RATIO: 16 (ref 6–22)
BUN: 50 mg/dL — ABNORMAL HIGH (ref 7–25)
CALCIUM: 7.8 mg/dL — ABNORMAL LOW (ref 8.6–10.3)
CHLORIDE: 106 mmol/L (ref 98–107)
CO2 TOTAL: 23 mmol/L (ref 21–31)
CREATININE: 3.14 mg/dL — ABNORMAL HIGH (ref 0.60–1.30)
ESTIMATED GFR: 21 mL/min/{1.73_m2} — ABNORMAL LOW (ref >59)
GLUCOSE: 337 mg/dL — ABNORMAL HIGH (ref 74–109)
OSMOLALITY, CALCULATED: 304 mOsm/kg — ABNORMAL HIGH (ref 270–290)
POTASSIUM: 4.2 mmol/L (ref 3.5–5.1)
SODIUM: 139 mmol/L (ref 136–145)

## 2023-03-18 LAB — HEPATITIS A (HAV) IGM ANTIBODY: HAV IGM: NEGATIVE

## 2023-03-18 LAB — HEPATITIS C ANTIBODY SCREEN WITH REFLEX TO HCV PCR: HCV ANTIBODY QUALITATIVE: NEGATIVE

## 2023-03-18 LAB — C3 COMPLEMENT, SERUM: C3 COMPLEMENT: 111 mg/dL (ref 81–157)

## 2023-03-18 LAB — HEPATITIS B CORE IGM, AB: HBV CORE IGM ANTIBODY QUALITATIVE: NEGATIVE

## 2023-03-18 LAB — POC BLOOD GLUCOSE (RESULTS)
GLUCOSE, POC: 285 mg/dl — ABNORMAL HIGH (ref 70–100)
GLUCOSE, POC: 279 mg/dl — ABNORMAL HIGH (ref 70–100)
GLUCOSE, POC: 286 mg/dl — ABNORMAL HIGH (ref 70–100)
GLUCOSE, POC: 241 mg/dl — ABNORMAL HIGH (ref 70–100)

## 2023-03-18 LAB — SYPHILIS SCREENING ALGORITHM WITH REFLEX, SERUM: SYPHILIS TP ANTIBODIES: NONREACTIVE

## 2023-03-18 LAB — C. DIFFICILE PCR
C. DIFFICILE TOXIN GENE, PCR: POSITIVE — AB
PRESUMPTIVE 027/NAP1/BI: POSITIVE — AB

## 2023-03-18 LAB — C4 COMPLEMENT, SERUM: C4 COMPLEMENT: 28 mg/dL (ref 12–39)

## 2023-03-18 LAB — PROTEIN FOR ELECTROPHORESIS: PROTEIN TOTAL: 6.2 g/dL (ref 5.6–7.6)

## 2023-03-18 LAB — MAGNESIUM: MAGNESIUM: 1.9 mg/dL (ref 1.9–2.7)

## 2023-03-18 LAB — HEPATITIS B SURFACE ANTIGEN: HBV SURFACE ANTIGEN QUALITATIVE: NEGATIVE

## 2023-03-18 LAB — CLOSTRIDIUM DIFFICILE TOXIN A/B: CLOSTRIDIUM DIFFICILE TOXIN A/B: POSITIVE — AB

## 2023-03-18 LAB — ANTI-DOUBLE STRANDED DNA AB: ANTI DNA ANTIBODY: NEGATIVE

## 2023-03-18 MED ORDER — VANCOMYCIN 25 MG/ML ORAL LIQUID WITH CHERRY FLAVORING
125.0000 mg | Freq: Four times a day (QID) | ORAL | Status: DC
Start: 2023-03-18 — End: 2023-03-21
  Administered 2023-03-18 – 2023-03-21 (×11): 125 mg via ORAL
  Filled 2023-03-18 (×14): qty 5

## 2023-03-18 MED ORDER — SODIUM CHLORIDE 0.45 % INTRAVENOUS SOLUTION
INTRAVENOUS | Status: DC
Start: 2023-03-18 — End: 2023-03-20
  Administered 2023-03-20: 0 mL via INTRAVENOUS

## 2023-03-18 MED ORDER — CALCIUM 200 MG (AS CALCIUM CARBONATE 500 MG) CHEWABLE TABLET
500.0000 mg | CHEWABLE_TABLET | Freq: Three times a day (TID) | ORAL | Status: DC | PRN
Start: 2023-03-18 — End: 2023-03-21
  Administered 2023-03-18 – 2023-03-20 (×2): 500 mg via ORAL
  Filled 2023-03-18 (×2): qty 1

## 2023-03-18 NOTE — Care Plan (Signed)
Problem: Adult Inpatient Plan of Care  Goal: Plan of Care Review  Outcome: Ongoing (see interventions/notes)  Goal: Patient-Specific Goal (Individualized)  Outcome: Ongoing (see interventions/notes)  Flowsheets (Taken 03/17/2023 2000)  Individualized Care Needs: assist with ADL's,monitor vital signs, medication administration, IV fluids, monitor labwork, promote mobility, intermittent reorientation as needed  Anxieties, Fears or Concerns: none stated  Patient-Specific Goals (Include Timeframe): discharge when criteria met  Plan of Care Reviewed With: patient  Goal: Absence of Hospital-Acquired Illness or Injury  Outcome: Ongoing (see interventions/notes)  Intervention: Identify and Manage Fall Risk  Recent Flowsheet Documentation  Taken 03/18/2023 0000 by Francesco Runner, RN  Safety Promotion/Fall Prevention:   activity supervised   fall prevention program maintained   nonskid shoes/slippers when out of bed   safety round/check completed  Taken 03/17/2023 2155 by Francesco Runner, RN  Safety Promotion/Fall Prevention:   activity supervised   fall prevention program maintained   nonskid shoes/slippers when out of bed   safety round/check completed  Taken 03/17/2023 2000 by Francesco Runner, RN  Safety Promotion/Fall Prevention:   activity supervised   fall prevention program maintained   nonskid shoes/slippers when out of bed   safety round/check completed  Intervention: Prevent Skin Injury  Recent Flowsheet Documentation  Taken 03/17/2023 2200 by Francesco Runner, RN  Body Position: supine, head elevated  Taken 03/17/2023 2000 by Francesco Runner, RN  Body Position: supine, head elevated  Intervention: Prevent and Manage VTE (Venous Thromboembolism) Risk  Recent Flowsheet Documentation  Taken 03/17/2023 2000 by Francesco Runner, RN  VTE Prevention/Management: ambulation promoted  Intervention: Prevent Infection  Recent Flowsheet Documentation  Taken 03/17/2023 2155 by Francesco Runner, RN  Infection Prevention:   promote handwashing   rest/sleep  promoted  Taken 03/17/2023 2000 by Francesco Runner, RN  Infection Prevention:   promote handwashing   rest/sleep promoted  Goal: Optimal Comfort and Wellbeing  Outcome: Ongoing (see interventions/notes)  Intervention: Provide Person-Centered Care  Recent Flowsheet Documentation  Taken 03/17/2023 2000 by Francesco Runner, RN  Trust Relationship/Rapport:   care explained   choices provided   emotional support provided  Goal: Rounds/Family Conference  Outcome: Ongoing (see interventions/notes)     Problem: Health Knowledge, Opportunity to Enhance (Adult,Obstetrics,Pediatric)  Goal: Knowledgeable about Health Subject/Topic  Description: Patient will demonstrate the desired outcomes by discharge/transition of care.  Outcome: Ongoing (see interventions/notes)     Problem: Fall Injury Risk  Goal: Absence of Fall and Fall-Related Injury  Outcome: Ongoing (see interventions/notes)  Intervention: Promote Scientist, clinical (histocompatibility and immunogenetics) Documentation  Taken 03/18/2023 0000 by Francesco Runner, RN  Safety Promotion/Fall Prevention:   activity supervised   fall prevention program maintained   nonskid shoes/slippers when out of bed   safety round/check completed  Taken 03/17/2023 2155 by Francesco Runner, RN  Safety Promotion/Fall Prevention:   activity supervised   fall prevention program maintained   nonskid shoes/slippers when out of bed   safety round/check completed  Taken 03/17/2023 2000 by Francesco Runner, RN  Safety Promotion/Fall Prevention:   activity supervised   fall prevention program maintained   nonskid shoes/slippers when out of bed   safety round/check completed     Problem: Skin Injury Risk Increased  Goal: Skin Health and Integrity  Outcome: Ongoing (see interventions/notes)  Intervention: Optimize Skin Protection  Recent Flowsheet Documentation  Taken 03/17/2023 2000 by Francesco Runner, RN  Activity Management: bedrest  Head of Bed (HOB) Positioning: HOB at 45 degrees

## 2023-03-18 NOTE — Consults (Signed)
[facility]  Palliative Care Nurse Practitioner  Consult Note    [name], [name], 62 y.o. male  Date of Birth:  1961-07-05  MRN: [id number]  Admit Date: 03/12/2023   Attending: Hospitalist  Sherie Don, MD   Reason for Consult: Goal Coordination    Chief Complaint:  Altered mental status    HPI:  [name] [name] is a 62 y.o. male who presented to the ED from [facility] nursing home with altered mental status.  He had fallen at the nursing home and was unable to bear weight on his left knee.  Imaging was negative for acute findings.  Patient had been found with 3 Suboxone strips in his bed, is on Suboxone for chronic pain.  Patient admitted with acute metabolic encephalopathy, auditory/visual hallucinations, acute renal failure, CKD, metabolic acidemia, opiate use disorder.  Pt tx with IVF, Suboxone was held until mentation improved, PT/OT eval.  Nephrology was consulted and impression is AKI on CKD4 and started patient on IV bicarb drip, kidney US negative for hydronephrosis.      Subjective:  Patient lying in bed upon arrival to the room.  He is alert and oriented during my visit.  He voiced no complaints at this time.      Review of Systems:  ROS: Other than ROS in the HPI, all other systems were negative.    Past Medical History:   Diagnosis Date    Anxiety     Depression     Diabetes (CMS HCC)     Diabetes mellitus, type 2 (CMS HCC)     GERD (gastroesophageal reflux disease)     MRSA (methicillin resistant staph aureus) culture positive 08/06/2019    L eye cellulitis    Osteomyelitis (CMS HCC)     Peripheral neuropathy          Past Surgical History:   Procedure Laterality Date    MANDIBLE SURGERY      SHOULDER SURGERY            Family Medical History:       Problem Relation (Age of Onset)    Diabetes Mother, Father            Social Determinants of Health     Financial Resource Strain: No   Transportation Needs: None   Social Connections: Family interaction   Intimate Partner Violence: None   Housing  Stability: Has housing     Social History     Socioeconomic History    Marital status: Widowed   Tobacco Use    Smoking status: Former     Types: Cigars    Smokeless tobacco: Never   Vaping Use    Vaping status: Former   Substance and Sexual Activity    Alcohol use: Yes     Comment: Drinks 3-4 beers daily.    Drug use: Never     Social Determinants of Health     Social Connections: Medium Risk (03/13/2023)    Social Connections     SDOH Social Isolation: 3 to 5 times a week       Current Outpatient Medications   Medication Instructions    acetaminophen (TYLENOL) 650 mg, Oral, EVERY 6 HOURS PRN, fever    aspirin 325 mg, Oral, DAILY    baclofen (LIORESAL) 10 mg, Oral, 2 TIMES DAILY    buprenorphine-naloxone (SUBOXONE) 2-0.5 mg Sublingual Film 1 Film, Sublingual, 3 TIMES DAILY    calcium carbonate (TUMS ORAL) Oral    cholecalciferol (Vitamin D3) (VITAMIN  D-3) 5,000 Units, Oral, DAILY    Cimetidine (TAGAMET) 300 mg, Oral, 2 TIMES DAILY    dapagliflozin propanediol (FARXIGA) 10 mg, Oral, DAILY    diclofenac sodium (VOLTAREN ARTHRITIS PAIN) 1 % Gel Apply Topically, 2 TIMES DAILY    docusate sodium (COLACE) 100 mg, Oral, 2 TIMES DAILY    ertapenem (INVANZ) 1 gram Injection Recon Soln infusion Intravenous, EVERY 24 HOURS, IM Q 24 hours for wound infection started 03/03/23    insulin glargine,hum.rec.anlog (LANTUS SOLOSTAR U-100 INSULIN SUBQ) Subcutaneous, 65 units subcutaneously at bedtime    insulin lispro (HUMALOG) 15 Units, Subcutaneous, 3 TIMES DAILY BEFORE MEALS    levoFLOXacin (LEVAQUIN) 500 mg, Oral, EVERY 24 HOURS, Start date 6/24    protein supplement (PROMOD) Oral Liquid 30 mL, Oral    Saccharomyces boulardii (FLORASTOR) 250 mg, Oral, 2 TIMES DAILY      Allergies   Allergen Reactions    Levaquin [Levofloxacin] Angioedema    Penicillins     Tramadol         Physical Exam:  Constitutional: no distress  Respiratory: Clear to auscultation bilaterally.   Cardiovascular: S1, S2 normal  Gastrointestinal: non-distended,  Soft, non-tender, Bowel sounds normal  Extremities: No edema  Integumentary:  Skin warm and dry  Neurologic: Alert and oriented x3    BP (P) 120/69   Pulse (P) 78   Temp (P) 37.4 C (99.3 F)   Resp (P) 18   Ht 1.803 m (5\' 11" )   Wt 94.1 kg (207 lb 8 oz)   SpO2 (P) 97%   BMI 28.94 kg/m      Pain: Numeric 0     Labs:  Lab Results Today:    Results for orders placed or performed during the hospital encounter of 03/12/23 (from the past 24 hour(s))   POC BLOOD GLUCOSE (RESULTS)   Result Value Ref Range    GLUCOSE, POC 299 (H) 70 - 100 mg/dl   POC BLOOD GLUCOSE (RESULTS)   Result Value Ref Range    GLUCOSE, POC 345 (H) 70 - 100 mg/dl   BASIC METABOLIC PANEL   Result Value Ref Range    SODIUM 139 136 - 145 mmol/L    POTASSIUM 4.2 3.5 - 5.1 mmol/L    CHLORIDE 106 98 - 107 mmol/L    CO2 TOTAL 23 21 - 31 mmol/L    ANION GAP 10 4 - 13 mmol/L    CALCIUM 7.8 (L) 8.6 - 10.3 mg/dL    GLUCOSE 161 (H) 74 - 109 mg/dL    BUN 50 (H) 7 - 25 mg/dL    CREATININE 0.96 (H) 0.60 - 1.30 mg/dL    BUN/CREA RATIO 16 6 - 22    ESTIMATED GFR 21 (L) >59 mL/min/1.102m^2    OSMOLALITY, CALCULATED 304 (H) 270 - 290 mOsm/kg   MAGNESIUM   Result Value Ref Range    MAGNESIUM 1.9 1.9 - 2.7 mg/dL   CBC WITH DIFF   Result Value Ref Range    WBC 26.7 (H) 3.6 - 10.2 x10^3/uL    RBC 3.81 (L) 4.06 - 5.63 x10^6/uL    HGB 10.1 (L) 12.5 - 16.3 g/dL    HCT 04.5 (L) 40.9 - 47.1 %    MCV 83.9 73.0 - 96.2 fL    MCH 26.6 23.8 - 33.4 pg    MCHC 31.7 (L) 32.5 - 36.3 g/dL    RDW 81.1 (H) 91.4 - 16.2 %    PLATELETS 256 140 - 440 x10^3/uL  MPV 8.7 7.4 - 11.4 fL    NEUTROPHIL % 92 (H) 43 - 77 %    LYMPHOCYTE % 2 (L) 16 - 44 %    MONOCYTE % 5 5 - 13 %    EOSINOPHIL % 0 (L) %    BASOPHIL % 0 0 - 1 %    NEUTROPHIL # 24.60 (H) 1.85 - 7.80 x10^3/uL    LYMPHOCYTE # 0.60 (L) 1.00 - 3.00 x10^3/uL    MONOCYTE # 1.30 (H) 0.30 - 1.00 x10^3/uL    EOSINOPHIL # 0.00 0.00 - 0.50 x10^3/uL    BASOPHIL # 0.10 0.00 - 0.10 x10^3/uL   SCAN DIFFERENTIAL   Result Value Ref Range     ANISOCYTOSIS 1+ (10-25%)     PLATELET MORPHOLOGY COMMENT Normal    POC BLOOD GLUCOSE (RESULTS)   Result Value Ref Range    GLUCOSE, POC 285 (H) 70 - 100 mg/dl   POC BLOOD GLUCOSE (RESULTS)   Result Value Ref Range    GLUCOSE, POC 279 (H) 70 - 100 mg/dl       Imaging Studies:    Images and Reports reviewed to current date.      ACP:  Advance directives discussed and information provided.    Code Status:  Currently a full code and wishes to remain a full code.    GOC:  Patient resides at [facility].  He states that the nursing staff assists him with all ADL's.  He wishes to return there at discharge.  We reviewed current condition and plan of care.  Patient states he is eager to be discharged as he is feeling better.  Education on the disease trajectories of chronic health conditions and impact on overall health discussed, patient voiced understanding and had no questions.  Palliative care discussed and patient declined services.      PPS prior to hospitalization: 50%    PPS currently: 50%      Persons present and participating in discussion: Patient      Was the conversation voluntary? Yes      Patient Disposition/Discharge needs:  Nursing home    ACP/GOC time:     16 minutes    Total visit time:     46 minutes including ACP/GOC time, Face to face, reviewing medical records, and documentation time.    Thank you for this consult.    Ellene Route, APRN,NP-C  Palliative Care Nurse Practitioner    This note may have been partially generated using Mmodal Fluency Direct system and there may be some incorrect words, spellings, and punctuation that were not noted in checking the note before saving.

## 2023-03-18 NOTE — Nurses Notes (Signed)
This patient's son called concerned with the positive C.Diff and the treatment. He said his father received Vanc for a year and a half at [facility] for VRSA in the foot and almost shut his kidneys down. The son is very concerned with his father receiving oral vancomycin with his current kidney function. He wanted me to let my provider know? I told the son that this was the current treatment and we would monitor the patient but he wanted something different if possible.  The provider told me that nephrology was follow the patient and that oral vancomycin is the preferred treatment for clostridium difficile.

## 2023-03-18 NOTE — PT Treatment (Signed)
[facility] Medicine [facility]  [address]  [place], [address]  [phone]  (Fax) [phone]  Rehabilitation Department  Physical Therapy Daily Inpatient Note    Date: 03/18/2023  Patient's Name: [name] [name]  Date of Birth: [birth year]  Height: Height: 180.3 cm (5\' 11" )  Weight: Weight: 94.1 kg (207 lb 8 oz)      Plan: Will continue under current POC.         Subjective/Objective/Assessment:  Flowsheet    03/18/23 0842   Rehab Session   Document Type therapy progress note (daily note)   PT Visit Date 03/18/23   General Information   Patient Profile Reviewed yes   Medical Lines Foley Catheter   Respiratory Status room air   Existing Precautions/Restrictions fall precautions   Pre Treatment Status   Pre Treatment Patient Status Patient supine in bed   Support Present Pre Treatment  None   Communication Pre Treatment  Nurse   Communication Pre Treatment Comment cleared for PT   Pre- Treatment Vital Signs   Pre-Treatment Heart Rate (beats/min) 78   Pre SpO2 (%) 96   O2 Delivery Pre Treatment room air   Pre-Treatment Pain   Pretreatment Pain Rating 8/10   Pre/Posttreatment Pain Comment left le   Bed Mobility Assessment/Treatment   Supine-Sit Independence maximum assist (25% patient effort);2 person assist required   Bed Mobility, Assistive Device bed rails;draw sheet   Transfer Assessment/Treatment   Transfer Comment declined to stand   Balance   Sitting Balance: Static good balance   Therapeutic Exercise   Comment patient did active anklepumps, knee extensions and marches 20 reps   Post Treatment Status   Post Treatment Patient Status Patient supine in bed   Patient Effort fair   Post-Treatment Vital Signs   Post-treatment Heart Rate (beats/min) 79   Post SpO2 (%) 94   O2 Delivery Post Treatment room air   Post-Treatment Pain   Posttreatment Pain Rating 8/10   Cognitive Assessment/Intervention   Behavior/Mood Observations alert;cooperative;confused   Physical Therapy Time and Intention   Total  PT Minutes: 16     Patient in the bed, transferred supine to sit with max of 2, sitting eob with sba, did few exs sitting eob, declined standing eob stated his left le is broke.. nsg aware, returned to supine with mod of 2, needs in reach, bed alarmed            Intervention minutes: THERAPEUTIC ACTIVITY 16 minutes    THERAPIST  Lane Hacker, PTA  03/18/2023, 12:05

## 2023-03-18 NOTE — OT Treatment (Signed)
[facility]  [address]  [place], [address]  [phone]  (Fax) [phone]  Rehabilitation Services  Occupational Therapy     Patient Name: [name] [name]  Date of Birth: 13-Dec-1960  Height: Height: 180.3 cm (5\' 11" )  Weight: Weight: 94.1 kg (207 lb 8 oz)  Room/Bed: 368/A  Payor: [place] MEDICARE / Plan:  MEDICARE ADVANTAGE PPO / Product Type: PPO /     PATIENT DID NOT PARTICIPATE IN THERAPY TODAY DUE TO: PATIENT DECLINED TREATMENT        Hildred Laser, OT 03/18/2023,09:55

## 2023-03-18 NOTE — Progress Notes (Signed)
[place] MEDICINE [facility]    HOSPITALIST PROGRESS NOTE    [name] [name]  Date of service: 03/18/2023  Date of Admission:  03/12/2023  Hospital Day:  LOS: 4 days     Subjective:   Patient seen and examined.  No new complaints.  Two liquid bowel movements reported.      Vital Signs:  Filed Vitals:    03/18/23 0806 03/18/23 1117 03/18/23 1208 03/18/23 1548   BP: 123/62  120/69 (!) 125/55   Pulse: 65 69 78 70   Resp: 16  18 17    Temp: 36.8 C (98.3 F)  37.4 C (99.3 F) 36.7 C (98 F)   SpO2: 93%  97% 90%        Physical Exam:  General:  Patient in NAD, resting in bed, no visitors present  Head:  Normocephalic, atraumatic   Neck:  Soft, supple, trachea midline  Heart:  RRR, S1 and S2 normal  Lungs:  Unlabored respirations.  Lungs bilateral clear to auscultation  Abdomen:  Soft, active bowel sounds,   Psych:  Cooperative, not agitated    Intake & Output:    Intake/Output Summary (Last 24 hours) at 03/18/2023 1647  Last data filed at 03/18/2023 1644  Gross per 24 hour   Intake 360 ml   Output 1650 ml   Net -1290 ml     I/O current shift:  07/09 0700 - 07/09 1859  In: 120 [P.O.:120]  Out: 1350 [Urine:1350]  Emesis:    BM:    Date of Last Bowel Movement: 03/16/23  Heme:      1/2 NS premix infusion, , Intravenous, Continuous  acetaminophen (TYLENOL) tablet, 650 mg, Oral, Q4H PRN  albuterol (PROVENTIL) 2.5 mg / 3 mL (0.083%) neb solution, 2.5 mg, Nebulization, Q4H PRN  alcohol 62 % (NOZIN NASAL SANITIZER) nasal solution, 1 Each, Each Nostril, 2x/day  aluminum-magnesium hydroxide-simethicone (MAG-AL PLUS) 200-200-20 mg per 5 mL oral liquid, 15 mL, Oral, 4x/day PRN  aspirin tablet 325 mg, 325 mg, Oral, Daily  buprenorphine-naloxone (SUBOXONE) 2-0.5mg  per sublingual tablet, 1 Tablet, Sublingual, 2x/day  calcium carbonate (TUMS) 500mg  (200mg  elemental calcium) chewable tablet, 500 mg, Oral, 3x/day PRN  collagenase (SANTYL) 250 unit/gm ointment, , Apply Topically, Daily  Correction/SSIP insulin lispro (HumaLOG) 100  units/mL injection, 0-18 Units, Subcutaneous, 4x/day AC  dextrose (GLUTOSE) 40% oral gel, 15 g, Oral, Q15 Min PRN  dextrose 50% (0.5 g/mL) injection - syringe, 12.5 g, Intravenous, Q15 Min PRN  diphenhydrAMINE (BENADRYL) 50 mg/mL injection, 25 mg, Intravenous, Q6H PRN  docusate sodium (COLACE) capsule, 100 mg, Oral, 2x/day  famotidine (PEPCID) 10 mg/mL injection, 10 mg, Intravenous, 2x/day  glucagon (GLUCAGEN DIAGNOSTIC KIT) injection 1 mg, 1 mg, IntraMUSCULAR, Once PRN  guaiFENesin 100mg  per 5mL oral liquid - for cough (expectorant), 200 mg, Oral, Q4H PRN  haloperidol (HALDOL) 5 mg/mL injection, 5 mg, Intravenous, Q6H PRN  heparin 5,000 unit/mL injection, 5,000 Units, Subcutaneous, Q12H  hydrALAZINE (APRESOLINE) injection 10 mg, 10 mg, Intravenous, Q4H PRN  LORazepam (ATIVAN) 2 mg/mL injection, 0.5 mg, Intravenous, Q4H PRN  nitroGLYCERIN (NITROSTAT) sublingual tablet, 0.4 mg, Sublingual, Q5 Min PRN  ondansetron (ZOFRAN) 2 mg/mL injection, 4 mg, Intravenous, Q8H PRN  polyethylene glycol (MIRALAX) oral packet, 17 g, Oral, Daily PRN  tamsulosin (FLOMAX) capsule, 0.4 mg, Oral, Daily after Dinner  traZODone (DESYREL) tablet, 50 mg, Oral, HS PRN          Labs:  Recent Results (from the past 48 hour(s))   CBC WITH DIFF  Collection Time: 03/18/23  4:55 AM   Result Value    WBC 26.7 (H)    HGB 10.1 (L)    HCT 31.9 (L)    PLATELETS 256      Results for orders placed or performed during the hospital encounter of 03/12/23 (from the past 48 hour(s))   BASIC METABOLIC PANEL    Collection Time: 03/18/23  4:55 AM   Result Value    SODIUM 139    POTASSIUM 4.2    CHLORIDE 106    CO2 TOTAL 23    GLUCOSE 337 (H)    BUN 50 (H)    CREATININE 3.14 (H)      No results found for this or any previous visit (from the past 48 hour(s)).   No results found for this or any previous visit (from the past 48 hour(s)).   No results found for this or any previous visit (from the past 48 hour(s)).   Results for orders placed or performed during the  hospital encounter of 03/12/23 (from the past 1344 hour(s))   HGA1C (HEMOGLOBIN A1C WITH EST AVG GLUCOSE)    Collection Time: 03/14/23  9:09 AM   Result Value    HEMOGLOBIN A1C 6.1 (H)      No results found for this or any previous visit (from the past 48 hour(s)).     Microbiology:  No results found for any visits on 03/12/23 (from the past 96 hour(s)).    Imaging:   ECG 12 LEAD  Normal sinus rhythm  Left axis deviation  Low voltage QRS  Inferior infarct (cited on or before 12-Mar-2023)  Prolonged QT  Abnormal ECG  When compared with ECG of 12-Mar-2023 15:09,  No significant change was found  Confirmed by Earnie Larsson [phone]) on 03/17/2023 10:25:18 AM        Assessment/ Plan:   Active Hospital Problems   (*Primary Problem)    Diagnosis    *AMS (altered mental status)    CKD (chronic kidney disease) stage 4, GFR 15-29 ml/min (CMS HCC)     Concern for angioedema  Etiology unclear, Levaquin was added to allergy list.  Angioedema appeared to have been resolved.  Vital signs stable.     Steroid induced leukocytosis   Secondary to use of Decadron   Angioedema improving with Decadron   White count is still trending up, could be secondary to steroid but would like to rule out C diff with patient having liquid stools.     Acute metabolic encephalopathy, resolved   Auditory and visual hallucination, resolved   Multifactorial:  Uremia, narcotics  Mentation is now back to baseline         Acute renal failure   Probably baseline CKD Stage 3  Metabolic acidemia  Avoid all nephrotoxins  Nephrology consult greatly appreciated     Opiate use disorder   On Suboxone 3 times daily  Restart Suboxone b.i.d.     Diabetes with hyperglycemia & Hypoglycemia  Steroid induced hyperglycemia  A1c of 6.1 on 03/14/2023  Continue insulin therapy     Impaired mobility and ADLs  PT evaluation        Code Status: Full Code     VTE prophylaxis: HSQ     Disposition: TBD      Condition and plan discussed with patient and all questions were  answered.    Disposition Planning:  TBD     Louis Meckel, MD  03/18/2023  [facility] MEDICINE HOSPITALIST

## 2023-03-18 NOTE — Nurses Notes (Signed)
Patient has had 2 liquid stools, Hospitalist  notified, specimen sent

## 2023-03-18 NOTE — Progress Notes (Signed)
[place] MEDICINE [facility]  Nephrology Consult Progress Note           [name], [name]  Date of Admission:  03/12/2023  Date of Birth:  May 19, 1961  Date of Service:  03/18/2023    Hospital Day:  LOS: 4 days       HPI/Subjective:   [name] [name] is a 62 y.o. male with chronic kidney disease stage 4 presented to the hospital with acute on chronic due to urine retention prerenal.    Patient does not have edema today.  No shortness of breath.  He feels little bit better than yesterday.  Review of Systems  Systematic review of 12 organ systems was negative except what mentioned in in the HPI.  Vital Signs:  Temp (24hrs) Max:37.4 C (99.3 F)      Temperature: (P) 37.4 C (99.3 F)  BP (Non-Invasive): (P) 120/69  MAP (Non-Invasive): (P) 80 mmHG  Heart Rate: (P) 78  Respiratory Rate: (P) 18  SpO2: (P) 97 %  I/O:  I/O last 24 hours:    Intake/Output Summary (Last 24 hours) at 03/18/2023 1258  Last data filed at 03/18/2023 0926  Gross per 24 hour   Intake 600 ml   Output 2050 ml   Net -1450 ml     Examination  Patient is alert awake and oriented not in acute distress.  Normal mood and affect.  HEENT normocephalic atraumatic.  Eye exam normal inspection.   Mucous membranes moist, no jaundice.  Neck exam no JVD normal inspection.  Cardiovascular system: Regular rate and rhythm no murmurs rubs or gallops. No chest wall tenderness  Lungs: Clear to auscultation bilaterally no wheezing no rhonchi.  Abdomen soft nontender nondistended.  Extremities no clubbing cyanosis or edema.  Neuro exam: EOMI, normal speech  Current Medications:  acetaminophen (TYLENOL) tablet, 650 mg, Oral, Q4H PRN  albuterol (PROVENTIL) 2.5 mg / 3 mL (0.083%) neb solution, 2.5 mg, Nebulization, Q4H PRN  alcohol 62 % (NOZIN NASAL SANITIZER) nasal solution, 1 Each, Each Nostril, 2x/day  aluminum-magnesium hydroxide-simethicone (MAG-AL PLUS) 200-200-20 mg per 5 mL oral liquid, 15 mL, Oral, 4x/day PRN  aspirin tablet 325 mg, 325 mg, Oral,  Daily  buprenorphine-naloxone (SUBOXONE) 2-0.5mg  per sublingual tablet, 1 Tablet, Sublingual, 2x/day  calcium carbonate (TUMS) 500mg  (200mg  elemental calcium) chewable tablet, 500 mg, Oral, 3x/day PRN  collagenase (SANTYL) 250 unit/gm ointment, , Apply Topically, Daily  Correction/SSIP insulin lispro (HumaLOG) 100 units/mL injection, 0-18 Units, Subcutaneous, 4x/day AC  dextrose (GLUTOSE) 40% oral gel, 15 g, Oral, Q15 Min PRN  dextrose 50% (0.5 g/mL) injection - syringe, 12.5 g, Intravenous, Q15 Min PRN  diphenhydrAMINE (BENADRYL) 50 mg/mL injection, 25 mg, Intravenous, Q6H PRN  docusate sodium (COLACE) capsule, 100 mg, Oral, 2x/day  famotidine (PEPCID) 10 mg/mL injection, 10 mg, Intravenous, 2x/day  glucagon (GLUCAGEN DIAGNOSTIC KIT) injection 1 mg, 1 mg, IntraMUSCULAR, Once PRN  guaiFENesin 100mg  per 5mL oral liquid - for cough (expectorant), 200 mg, Oral, Q4H PRN  haloperidol (HALDOL) 5 mg/mL injection, 5 mg, Intravenous, Q6H PRN  heparin 5,000 unit/mL injection, 5,000 Units, Subcutaneous, Q12H  hydrALAZINE (APRESOLINE) injection 10 mg, 10 mg, Intravenous, Q4H PRN  LORazepam (ATIVAN) 2 mg/mL injection, 0.5 mg, Intravenous, Q4H PRN  nitroGLYCERIN (NITROSTAT) sublingual tablet, 0.4 mg, Sublingual, Q5 Min PRN  ondansetron (ZOFRAN) 2 mg/mL injection, 4 mg, Intravenous, Q8H PRN  polyethylene glycol (MIRALAX) oral packet, 17 g, Oral, Daily PRN  sodium bicarbonate 150 mEq in SW 1,000 mL infusion, , Intravenous, Continuous  tamsulosin (FLOMAX)  capsule, 0.4 mg, Oral, Daily after Dinner  traZODone (DESYREL) tablet, 50 mg, Oral, HS PRN      Labs:  BMP:   139 (07/09 0455) 106 (07/09 0455) 50* (07/09 0455)    /     337* (07/09 0455)   4.2 (07/09 0455) 23 (07/09 0455) 3.14* (07/09 0455) \             CBC:     26.7* (07/09 0455) \   10.1* (07/09 0455) /   256 (07/09 0455)      / 31.9* (07/09 0455) \          Microbiology:  No results found for any visits on 03/12/23 (from the past 96 hour(s)).    Imaging:   ECG 12 LEAD  Normal  sinus rhythm  Left axis deviation  Low voltage QRS  Inferior infarct (cited on or before 12-Mar-2023)  Prolonged QT  Abnormal ECG  When compared with ECG of 12-Mar-2023 15:09,  No significant change was found  Confirmed by Earnie Larsson [phone]) on 03/17/2023 10:25:18 AM    Assessment/ Plan:   Active Hospital Problems   (*Primary Problem)    Diagnosis    *AMS (altered mental status)    CKD (chronic kidney disease) stage 4, GFR 15-29 ml/min (CMS HCC)       Chronic kidney disease stage 4, acute on chronic kidney disease  -obstructive uropathy/retention plus prerenal  -baseline Cr  2.7  -SCR 3.14* (07/09 0455)  -keep Foley  -keep Flomax  -keep IV fluids, started bicarb drip  -serology workup in progress  -follow up PTH and vitamin-D  -kidney function is improving  -no need for dialysis    Acid-base , better   Electrolytes , stable      [name], MD, FASN, 03/18/2023, 12:58

## 2023-03-18 NOTE — Nurses Notes (Signed)
Patient agitated during assessment. Patient asked for his ativan. Ativan given patient tolerated well, pleasant at this time.

## 2023-03-19 DIAGNOSIS — N2581 Secondary hyperparathyroidism of renal origin: Secondary | ICD-10-CM

## 2023-03-19 DIAGNOSIS — R5381 Other malaise: Secondary | ICD-10-CM

## 2023-03-19 DIAGNOSIS — N400 Enlarged prostate without lower urinary tract symptoms: Secondary | ICD-10-CM

## 2023-03-19 DIAGNOSIS — R419 Unspecified symptoms and signs involving cognitive functions and awareness: Secondary | ICD-10-CM

## 2023-03-19 DIAGNOSIS — I639 Cerebral infarction, unspecified: Secondary | ICD-10-CM

## 2023-03-19 DIAGNOSIS — N183 Chronic kidney disease, stage 3 unspecified (CMS HCC): Secondary | ICD-10-CM

## 2023-03-19 LAB — ANCA SCREEN WITH REFLEX TO ANCA TITER: ANCA SCREEN: NEGATIVE

## 2023-03-19 LAB — CBC WITH DIFF
BASOPHIL #: 0.1 10*3/uL (ref 0.00–0.10)
BASOPHIL %: 0 % (ref 0–1)
EOSINOPHIL #: 0.1 10*3/uL (ref 0.00–0.50)
EOSINOPHIL %: 0 % — ABNORMAL LOW
HCT: 32.3 % — ABNORMAL LOW (ref 36.7–47.1)
HGB: 10.4 g/dL — ABNORMAL LOW (ref 12.5–16.3)
LYMPHOCYTE #: 1.6 10*3/uL (ref 1.00–3.00)
LYMPHOCYTE %: 8 % — ABNORMAL LOW (ref 16–44)
MCH: 27.1 pg (ref 23.8–33.4)
MCHC: 32.3 g/dL — ABNORMAL LOW (ref 32.5–36.3)
MCV: 84 fL (ref 73.0–96.2)
MONOCYTE #: 1.2 10*3/uL — ABNORMAL HIGH (ref 0.30–1.00)
MONOCYTE %: 6 % (ref 5–13)
MPV: 7.9 fL (ref 7.4–11.4)
NEUTROPHIL #: 17.7 10*3/uL — ABNORMAL HIGH (ref 1.85–7.80)
NEUTROPHIL %: 86 % — ABNORMAL HIGH (ref 43–77)
PLATELETS: 260 10*3/uL (ref 140–440)
RBC: 3.84 10*6/uL — ABNORMAL LOW (ref 4.06–5.63)
RDW: 17.1 % — ABNORMAL HIGH (ref 12.1–16.2)
WBC: 20.6 10*3/uL — ABNORMAL HIGH (ref 3.6–10.2)

## 2023-03-19 LAB — SCAN DIFFERENTIAL: PLATELET MORPHOLOGY COMMENT: ADEQUATE

## 2023-03-19 LAB — POC BLOOD GLUCOSE (RESULTS)
GLUCOSE, POC: 194 mg/dl — ABNORMAL HIGH (ref 70–100)
GLUCOSE, POC: 230 mg/dl — ABNORMAL HIGH (ref 70–100)

## 2023-03-19 LAB — BASIC METABOLIC PANEL
ANION GAP: 7 mmol/L (ref 4–13)
BUN/CREA RATIO: 15 (ref 6–22)
BUN: 50 mg/dL — ABNORMAL HIGH (ref 7–25)
CALCIUM: 7.4 mg/dL — ABNORMAL LOW (ref 8.6–10.3)
CHLORIDE: 107 mmol/L (ref 98–107)
CO2 TOTAL: 24 mmol/L (ref 21–31)
CREATININE: 3.28 mg/dL — ABNORMAL HIGH (ref 0.60–1.30)
ESTIMATED GFR: 20 mL/min/{1.73_m2} — ABNORMAL LOW (ref >59)
GLUCOSE: 198 mg/dL — ABNORMAL HIGH (ref 74–109)
OSMOLALITY, CALCULATED: 295 mOsm/kg — ABNORMAL HIGH (ref 270–290)
POTASSIUM: 3.7 mmol/L (ref 3.5–5.1)
SODIUM: 138 mmol/L (ref 136–145)

## 2023-03-19 LAB — ANTI-STREPTOLYSIN O: ANTI-STREPTOLYSIN O: 116 IU/mL (ref <200)

## 2023-03-19 MED ORDER — ERGOCALCIFEROL (VITAMIN D2) 1,250 MCG (50,000 UNIT) CAPSULE
50000.0000 [IU] | ORAL_CAPSULE | ORAL | Status: DC
Start: 2023-03-19 — End: 2023-03-21
  Administered 2023-03-19: 50000 [IU] via ORAL
  Filled 2023-03-19: qty 1

## 2023-03-19 MED ORDER — CALCITRIOL 0.25 MCG CAPSULE
0.2500 ug | ORAL_CAPSULE | Freq: Two times a day (BID) | ORAL | Status: DC
Start: 2023-03-19 — End: 2023-03-21
  Administered 2023-03-19 – 2023-03-21 (×5): 0.25 ug via ORAL
  Filled 2023-03-19 (×5): qty 1

## 2023-03-19 NOTE — Progress Notes (Addendum)
[facility]   Progress Note    [name] [name]  Date of service: 03/19/2023  Date of Admission:  03/12/2023  Hospital Day:  LOS: 5 days     Subjective:  No overnight events reported.  Patient was accompanied by his brother at the time of the evaluation. The care plan was reviewed in detail.  All questions/concerns were addressed.    Review of Systems:    As mentioned above.     Objective:      Vital Signs:  Vitals:    03/18/23 2216 03/18/23 2353 03/19/23 0350 03/19/23 1023   BP:  103/60  (!) 140/64   Pulse: 82 89  85   Resp:  17  20   Temp:  36.3 C (97.3 F)     SpO2:  92%  90%   Weight:   97.2 kg (214 lb 4 oz)    Height:       BMI:                I/O:  I/O last 24 hours:    Intake/Output Summary (Last 24 hours) at 03/19/2023 1342  Last data filed at 03/19/2023 1327  Gross per 24 hour   Intake 240 ml   Output 1000 ml   Net -760 ml         1/2 NS premix infusion, , Intravenous, Continuous  acetaminophen (TYLENOL) tablet, 650 mg, Oral, Q4H PRN  albuterol (PROVENTIL) 2.5 mg / 3 mL (0.083%) neb solution, 2.5 mg, Nebulization, Q4H PRN  alcohol 62 % (NOZIN NASAL SANITIZER) nasal solution, 1 Each, Each Nostril, 2x/day  aluminum-magnesium hydroxide-simethicone (MAG-AL PLUS) 200-200-20 mg per 5 mL oral liquid, 15 mL, Oral, 4x/day PRN  aspirin tablet 325 mg, 325 mg, Oral, Daily  buprenorphine-naloxone (SUBOXONE) 2-0.5mg  per sublingual tablet, 1 Tablet, Sublingual, 2x/day  calcitriol (ROCALTROL) capsule, 0.25 mcg, Oral, Q12H  calcium carbonate (TUMS) 500mg  (200mg  elemental calcium) chewable tablet, 500 mg, Oral, 3x/day PRN  collagenase (SANTYL) 250 unit/gm ointment, , Apply Topically, Daily  Correction/SSIP insulin lispro (HumaLOG) 100 units/mL injection, 0-18 Units, Subcutaneous, 4x/day AC  dextrose (GLUTOSE) 40% oral gel, 15 g, Oral, Q15 Min PRN  dextrose 50% (0.5 g/mL) injection - syringe, 12.5 g, Intravenous, Q15 Min PRN  diphenhydrAMINE (BENADRYL) 50 mg/mL injection, 25 mg, Intravenous, Q6H PRN  docusate  sodium (COLACE) capsule, 100 mg, Oral, 2x/day  ergocalciferol-vitamin D2 (DRISDOL) capsule, 50,000 Units, Oral, M and F  famotidine (PEPCID) 10 mg/mL injection, 10 mg, Intravenous, 2x/day  glucagon (GLUCAGEN DIAGNOSTIC KIT) injection 1 mg, 1 mg, IntraMUSCULAR, Once PRN  guaiFENesin 100mg  per 5mL oral liquid - for cough (expectorant), 200 mg, Oral, Q4H PRN  haloperidol (HALDOL) 5 mg/mL injection, 5 mg, Intravenous, Q6H PRN  heparin 5,000 unit/mL injection, 5,000 Units, Subcutaneous, Q12H  hydrALAZINE (APRESOLINE) injection 10 mg, 10 mg, Intravenous, Q4H PRN  LORazepam (ATIVAN) 2 mg/mL injection, 0.5 mg, Intravenous, Q4H PRN  nitroGLYCERIN (NITROSTAT) sublingual tablet, 0.4 mg, Sublingual, Q5 Min PRN  ondansetron (ZOFRAN) 2 mg/mL injection, 4 mg, Intravenous, Q8H PRN  polyethylene glycol (MIRALAX) oral packet, 17 g, Oral, Daily PRN  tamsulosin (FLOMAX) capsule, 0.4 mg, Oral, Daily after Dinner  traZODone (DESYREL) tablet, 50 mg, Oral, HS PRN  vancomycin 25 mg per mL oral liquid with cherry flavoring, 125 mg, Oral, 4x/day        Physical Exam:    Gen: NAD, pleasant, alert/oriented x 3, speaking in full sentences, family present  HEENT:  Atraumatic, moist mucous membranes  Neck: supple, no  TM  Heart:  Normal S1/S2, no extra heart sounds noted  Chest:  Clear to auscultation bilaterally, good aeration to bases  Abdomen:+BS, soft,NT/ND, no rebound/guarding, normal contour  Extr: no c/c/e  Neurological:CN II-XII grossly normal, no focal deficits  Skin: Skin is warm and dry, no erythema noted    Labs:  Results for orders placed or performed during the hospital encounter of 03/12/23 (from the past 24 hour(s))   C. DIFFICILE PCR    Specimen: Stool   Result Value Ref Range    C. DIFFICILE TOXIN GENE, PCR Positive (A) Negative    PRESUMPTIVE 027/NAP1/BI Positive (A) Not Detected, Negative    Narrative    Error^5011^Post-run analysis error^Error 5011: Signal loss detected in the amplification curve for analyte [SPC]. 15.2  decrease in signal with 21.1% decrease at cycle 27.^[id number]  QIHKV^4259^DGLO-VFI analysis error^Error 5011: Signal loss detected in the amplification curve for analyte [SPC]. 15.2 decrease in signal with 21.1% decrease at cycle 27.^[id number]   CLOSTRIDIUM DIFFICILE TOXIN A/B    Specimen: Stool   Result Value Ref Range    CLOSTRIDIUM DIFFICILE TOXIN A/B Positive (A) Negative   CBC/DIFF    Narrative    The following orders were created for panel order CBC/DIFF.  Procedure                               Abnormality         Status                     ---------                               -----------         ------                     CBC WITH YTKZ[[id number]]                Abnormal            Final result                 Please view results for these tests on the individual orders.   BASIC METABOLIC PANEL   Result Value Ref Range    SODIUM 138 136 - 145 mmol/L    POTASSIUM 3.7 3.5 - 5.1 mmol/L    CHLORIDE 107 98 - 107 mmol/L    CO2 TOTAL 24 21 - 31 mmol/L    ANION GAP 7 4 - 13 mmol/L    CALCIUM 7.4 (L) 8.6 - 10.3 mg/dL    GLUCOSE 573 (H) 74 - 109 mg/dL    BUN 50 (H) 7 - 25 mg/dL    CREATININE 2.20 (H) 0.60 - 1.30 mg/dL    BUN/CREA RATIO 15 6 - 22    ESTIMATED GFR 20 (L) >59 mL/min/1.44m^2    OSMOLALITY, CALCULATED 295 (H) 270 - 290 mOsm/kg    Narrative    Estimated Glomerular Filtration Rate (eGFR) is calculated using the CKD-EPI (2021) equation, intended for patients 4 years of age and older. If gender is not documented or "unknown", there will be no eGFR calculation.     CBC WITH DIFF   Result Value Ref Range    WBC 20.6 (H) 3.6 - 10.2 x10^3/uL    RBC 3.84 (L) 4.06 - 5.63 x10^6/uL    HGB 10.4 (  L) 12.5 - 16.3 g/dL    HCT 64.4 (L) 03.4 - 47.1 %    MCV 84.0 73.0 - 96.2 fL    MCH 27.1 23.8 - 33.4 pg    MCHC 32.3 (L) 32.5 - 36.3 g/dL    RDW 74.2 (H) 59.5 - 16.2 %    PLATELETS 260 140 - 440 x10^3/uL    MPV 7.9 7.4 - 11.4 fL    NEUTROPHIL % 86 (H) 43 - 77 %    LYMPHOCYTE % 8 (L) 16 - 44 %    MONOCYTE % 6 5 - 13  %    EOSINOPHIL % 0 (L) %    BASOPHIL % 0 0 - 1 %    NEUTROPHIL # 17.70 (H) 1.85 - 7.80 x10^3/uL    LYMPHOCYTE # 1.60 1.00 - 3.00 x10^3/uL    MONOCYTE # 1.20 (H) 0.30 - 1.00 x10^3/uL    EOSINOPHIL # 0.10 0.00 - 0.50 x10^3/uL    BASOPHIL # 0.10 0.00 - 0.10 x10^3/uL   SCAN DIFFERENTIAL   Result Value Ref Range    ANISOCYTOSIS 1+ (10-25%)     PLATELET MORPHOLOGY COMMENT Adequate    POC BLOOD GLUCOSE (RESULTS)   Result Value Ref Range    GLUCOSE, POC 286 (H) 70 - 100 mg/dl   POC BLOOD GLUCOSE (RESULTS)   Result Value Ref Range    GLUCOSE, POC 241 (H) 70 - 100 mg/dl   POC BLOOD GLUCOSE (RESULTS)   Result Value Ref Range    GLUCOSE, POC 194 (H) 70 - 100 mg/dl      Microbiology:  Hospital Encounter on 03/12/23 (from the past 96 hour(s))   C. DIFFICILE PCR    Collection Time: 03/18/23  4:33 PM    Specimen: Stool   Culture Result Status    C. DIFFICILE TOXIN GENE, PCR Positive (A) Final    PRESUMPTIVE 027/NAP1/BI Positive (A) Final    Narrative    Error^5011^Post-run analysis error^Error 5011: Signal loss detected in the amplification curve for analyte [SPC]. 15.2 decrease in signal with 21.1% decrease at cycle 27.^[id number]  ACZYS^0630^ZSWF-UXN analysis error^Error 5011: Signal loss detected in the amplification curve for analyte [SPC]. 15.2 decrease in signal with 21.1% decrease at cycle 27.^[id number]   CLOSTRIDIUM DIFFICILE TOXIN A/B    Collection Time: 03/18/23  4:33 PM    Specimen: Stool   Culture Result Status    CLOSTRIDIUM DIFFICILE TOXIN A/B Positive (A) Final     Assessment/ Plan:   Acute Metabolic Encephalopathy, resolved  Neurocognitive Disorder  -CT Head unremarkable for any acute intracranial processes  -continue to assess for/treat underlying infectious process  -monitor closely     Abdominal Pain  Acute C. Dif Colitis  -afebrile, hemodynamically stable, WBC 20.6  -antiemetic Rx continue  -advance diet as tolerated  -conservative pain management strategy in place     Angioedema, resolved  -possibly  secondary to Levaquin Rx exposure  -monitor closely    .Acute vs Kidney Injury, stage IIIb  -Cr 4.0--> 3.28  -avoid/limit nephrotoxic agents/ACEI/ARBs, NSAIDs, contrast media  -renally adjust medication regimen  -continue gentle hydration  -a.m. CMP to follow     BPH  -continue w/ outpatient regimen  -stable; no active issues     Opiate Dependence  -continue Suboxone Rx  -stable; no active issues    Deconditioned State  -PT/OT as per schedule  -out of bed to chair encouraged     Code Status: Full Code    Diet:  Diabetic  DVT Prophylaxis   -SCDs/heparin    Disposition:  Pending clinical course    I have spent 55 minutes care for this patient with over half in discussion of the diagnosis and the importance of compliance with the treatment plan.  The remainder of the time was spent examining the patient and discussing the plan of care patient's family/care team    Lalla Brothers, MD    This note was partially generated using MModal Fluency Direct system, and there may be some incorrect words, spellings, and punctuation that were not noted in checking the note before saving.

## 2023-03-19 NOTE — PT Treatment (Signed)
[facility]  [address]  [place], [address]  [phone]  (Fax) [phone]  Rehabilitation Services  Physical Therapy     Patient Name: [name] [name]  Date of Birth: 08/27/1961  Height: Height: 180.3 cm (5\' 11" )  Weight: Weight: 97.2 kg (214 lb 4 oz)  Room/Bed: 368/A  Payor: [place] MEDICARE / Plan: [place] MEDICARE ADVANTAGE PPO / Product Type: PPO /     PATIENT DID NOT PARTICIPATE IN THERAPY TODAY DUE TO: PATIENT DECLINED TREATMENT family present as well        Lane Hacker, PTA 03/19/2023,11:26

## 2023-03-19 NOTE — Progress Notes (Signed)
[place] [facility]  Nephrology Consult Progress Note           Zandyr, Meader  Date of Admission:  03/12/2023  Date of Birth:  17-Dec-1960  Date of Service:  03/19/2023    Hospital Day:  LOS: 5 days       HPI/Subjective:   [name] [name] is a 62 y.o. male with chronic kidney disease stage 4 presented to the hospital with acute on chronic due to urine retention prerenal.    Patient does not have edema today.  No shortness of breath.  He feels little bit better than yesterday.  Review of Systems  Systematic review of 12 organ systems was negative except what mentioned in in the HPI.  Vital Signs:  Temp (24hrs) Max:36.7 C (98 F)      Temperature: 36.3 C (97.3 F)  BP (Non-Invasive): (!) 140/64  MAP (Non-Invasive): 70 mmHG  Heart Rate: 85  Respiratory Rate: 20  SpO2: 90 %  I/O:  I/O last 24 hours:    Intake/Output Summary (Last 24 hours) at 03/19/2023 1253  Last data filed at 03/19/2023 0500  Gross per 24 hour   Intake 0 ml   Output 1000 ml   Net -1000 ml     Examination  Patient is alert awake and oriented not in acute distress.  Normal mood and affect.  HEENT normocephalic atraumatic.  Eye exam normal inspection.   Mucous membranes moist, no jaundice.  Neck exam no JVD normal inspection.  Cardiovascular system: Regular rate and rhythm no murmurs rubs or gallops. No chest wall tenderness  Lungs: Clear to auscultation bilaterally no wheezing no rhonchi.  Abdomen soft nontender nondistended.  Extremities no clubbing cyanosis or edema.  Neuro exam: EOMI, normal speech  Current Medications:  1/2 NS premix infusion, , Intravenous, Continuous  acetaminophen (TYLENOL) tablet, 650 mg, Oral, Q4H PRN  albuterol (PROVENTIL) 2.5 mg / 3 mL (0.083%) neb solution, 2.5 mg, Nebulization, Q4H PRN  alcohol 62 % (NOZIN NASAL SANITIZER) nasal solution, 1 Each, Each Nostril, 2x/day  aluminum-magnesium hydroxide-simethicone (MAG-AL PLUS) 200-200-20 mg per 5 mL oral liquid, 15 mL, Oral, 4x/day PRN  aspirin tablet 325 mg, 325  mg, Oral, Daily  buprenorphine-naloxone (SUBOXONE) 2-0.5mg  per sublingual tablet, 1 Tablet, Sublingual, 2x/day  calcitriol (ROCALTROL) capsule, 0.25 mcg, Oral, Q12H  calcium carbonate (TUMS) 500mg  (200mg  elemental calcium) chewable tablet, 500 mg, Oral, 3x/day PRN  collagenase (SANTYL) 250 unit/gm ointment, , Apply Topically, Daily  Correction/SSIP insulin lispro (HumaLOG) 100 units/mL injection, 0-18 Units, Subcutaneous, 4x/day AC  dextrose (GLUTOSE) 40% oral gel, 15 g, Oral, Q15 Min PRN  dextrose 50% (0.5 g/mL) injection - syringe, 12.5 g, Intravenous, Q15 Min PRN  diphenhydrAMINE (BENADRYL) 50 mg/mL injection, 25 mg, Intravenous, Q6H PRN  docusate sodium (COLACE) capsule, 100 mg, Oral, 2x/day  ergocalciferol-vitamin D2 (DRISDOL) capsule, 50,000 Units, Oral, M and F  famotidine (PEPCID) 10 mg/mL injection, 10 mg, Intravenous, 2x/day  glucagon (GLUCAGEN DIAGNOSTIC KIT) injection 1 mg, 1 mg, IntraMUSCULAR, Once PRN  guaiFENesin 100mg  per 5mL oral liquid - for cough (expectorant), 200 mg, Oral, Q4H PRN  haloperidol (HALDOL) 5 mg/mL injection, 5 mg, Intravenous, Q6H PRN  heparin 5,000 unit/mL injection, 5,000 Units, Subcutaneous, Q12H  hydrALAZINE (APRESOLINE) injection 10 mg, 10 mg, Intravenous, Q4H PRN  LORazepam (ATIVAN) 2 mg/mL injection, 0.5 mg, Intravenous, Q4H PRN  nitroGLYCERIN (NITROSTAT) sublingual tablet, 0.4 mg, Sublingual, Q5 Min PRN  ondansetron (ZOFRAN) 2 mg/mL injection, 4 mg, Intravenous, Q8H PRN  polyethylene glycol (MIRALAX) oral  packet, 17 g, Oral, Daily PRN  tamsulosin (FLOMAX) capsule, 0.4 mg, Oral, Daily after Dinner  traZODone (DESYREL) tablet, 50 mg, Oral, HS PRN  vancomycin 25 mg per mL oral liquid with cherry flavoring, 125 mg, Oral, 4x/day      Labs:  BMP:   138 (07/10 4540) 107 (07/10 9811) 50* (07/10 9147)    /     198* (07/10 8295)   3.7 (07/10 6213) 24 (07/10 0865) 3.28* (07/10 0619) \             CBC:     20.6* (07/10 7846) \   10.4* (07/10 9629) /   260 (07/10 5284)      / 32.3*  (07/10 1324) \          Microbiology:  Hospital Encounter on 03/12/23 (from the past 96 hour(s))   C. DIFFICILE PCR    Collection Time: 03/18/23  4:33 PM    Specimen: Stool   Culture Result Status    C. DIFFICILE TOXIN GENE, PCR Positive (A) Final    PRESUMPTIVE 027/NAP1/BI Positive (A) Final    Narrative    Error^5011^Post-run analysis error^Error 5011: Signal loss detected in the amplification curve for analyte [SPC]. 15.2 decrease in signal with 21.1% decrease at cycle 27.^[id number]  [id number]^[id number]^LOVF-IEP analysis error^Error 5011: Signal loss detected in the amplification curve for analyte [SPC]. 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)  Prolonged QT  Abnormal ECG  When compared with ECG of 12-Mar-2023 15:09,  No significant change was found  Confirmed by Earnie Larsson ([id number]) on 03/17/2023 10:25:18 AM    Assessment/ Plan:   Active Hospital Problems   (*Primary Problem)    Diagnosis    *AMS (altered mental status)    Leukocytosis, unspecified type    CKD (chronic kidney disease) stage 4, GFR 15-29 ml/min (CMS HCC)       Chronic kidney disease stage 4, acute on chronic kidney disease  -obstructive uropathy/retention plus prerenal  -baseline Cr  2.7  -SCR 3.28* (07/10 0160) condition is stable  -keep Foley  -keep Flomax  -keep IV fluids  -serology workup is negative  -kidney function is improving  -no need for dialysis    Acid-base , better   Electrolytes , stable    Hypocalcemia  -start vitamin-D and calcitriol secondary hyperparathyroidism noted  MY ORDERS LAST 24 (24h ago, onward)       Start     Ordered    03/19/23 1100  calcitriol (ROCALTROL) capsule  EVERY 12 HOURS          03/19/23 0935    03/19/23 1000  ergocalciferol-vitamin D2 (DRISDOL) capsule  EVERY MO AND FR         03/19/23 0936    03/18/23 1500  1/2 NS premix infusion  CONTINUOUS         03/18/23 1301                      [name], MD, FASN, 03/19/2023, 12:53

## 2023-03-20 DIAGNOSIS — A0472 Enterocolitis due to Clostridium difficile, not specified as recurrent: Secondary | ICD-10-CM

## 2023-03-20 LAB — MANUAL DIFFERENTIAL
EOSINOPHIL %: 1 % (ref 0–7)
EOSINOPHIL ABSOLUTE: 0.22 10*3/uL (ref 0.00–0.80)
EOSINOPHILS MANUAL: 1
LYMPHOCYTE %: 8 % — ABNORMAL LOW (ref 25–45)
LYMPHOCYTE ABSOLUTE: 1.72 10*3/uL (ref 1.10–5.00)
LYMPHOCYTES MANUAL: 8
MONOCYTE %: 5 % (ref 0–12)
MONOCYTE ABSOLUTE: 1.08 10*3/uL (ref 0.00–1.30)
MONOCYTES MANUAL: 5
NEUTROPHIL %: 86 % — ABNORMAL HIGH (ref 40–76)
NEUTROPHIL ABSOLUTE: 18.49 10*3/uL — ABNORMAL HIGH (ref 1.80–8.40)
NEUTROPHILS MANUAL: 86
PLATELET MORPHOLOGY COMMENT: NORMAL
TOTAL CELLS COUNTED [#] IN BLOOD: 100
WBC: 21.5 10*3/uL

## 2023-03-20 LAB — COMPREHENSIVE METABOLIC PANEL, NON-FASTING
ALBUMIN/GLOBULIN RATIO: 0.9 (ref 0.8–1.4)
ALBUMIN: 2.4 g/dL — ABNORMAL LOW (ref 3.5–5.7)
ALKALINE PHOSPHATASE: 52 U/L (ref 34–104)
ALT (SGPT): <7 U/L — ABNORMAL LOW (ref 7–52)
ANION GAP: 7 mmol/L (ref 4–13)
AST (SGOT): 7 U/L — ABNORMAL LOW (ref 13–39)
BILIRUBIN TOTAL: 0.8 mg/dL (ref 0.3–1.2)
BUN/CREA RATIO: 13 (ref 6–22)
BUN: 45 mg/dL — ABNORMAL HIGH (ref 7–25)
CALCIUM, CORRECTED: 8.5 mg/dL — ABNORMAL LOW (ref 8.9–10.8)
CALCIUM: 7.2 mg/dL — ABNORMAL LOW (ref 8.6–10.3)
CHLORIDE: 106 mmol/L (ref 98–107)
CO2 TOTAL: 23 mmol/L (ref 21–31)
CREATININE: 3.35 mg/dL — ABNORMAL HIGH (ref 0.60–1.30)
ESTIMATED GFR: 20 mL/min/{1.73_m2} — ABNORMAL LOW (ref >59)
GLOBULIN: 2.8 — ABNORMAL LOW (ref 2.9–5.4)
GLUCOSE: 175 mg/dL — ABNORMAL HIGH (ref 74–109)
OSMOLALITY, CALCULATED: 288 mOsm/kg (ref 270–290)
POTASSIUM: 3.8 mmol/L (ref 3.5–5.1)
PROTEIN TOTAL: 5.2 g/dL — ABNORMAL LOW (ref 6.4–8.9)
SODIUM: 136 mmol/L (ref 136–145)

## 2023-03-20 LAB — THYROID STIMULATING HORMONE (SENSITIVE TSH): TSH: 0.479 u[IU]/mL (ref 0.450–5.330)

## 2023-03-20 LAB — FOLATE: FOLATE: 12.6 ng/mL (ref 5.9–24.4)

## 2023-03-20 LAB — CBC WITH DIFF
HCT: 32.9 % — ABNORMAL LOW (ref 36.7–47.1)
HGB: 10.5 g/dL — ABNORMAL LOW (ref 12.5–16.3)
MCH: 26.9 pg (ref 23.8–33.4)
MCHC: 32 g/dL — ABNORMAL LOW (ref 32.5–36.3)
MCV: 84.1 fL (ref 73.0–96.2)
MPV: 8.1 fL (ref 7.4–11.4)
PLATELETS: 255 10*3/uL (ref 140–440)
RBC: 3.91 10*6/uL — ABNORMAL LOW (ref 4.06–5.63)
RDW: 16.8 % — ABNORMAL HIGH (ref 12.1–16.2)
WBC: 21.5 10*3/uL — ABNORMAL HIGH (ref 3.6–10.2)

## 2023-03-20 LAB — POC BLOOD GLUCOSE (RESULTS)
GLUCOSE, POC: 148 mg/dl — ABNORMAL HIGH (ref 70–100)
GLUCOSE, POC: 209 mg/dl — ABNORMAL HIGH (ref 70–100)
GLUCOSE, POC: 212 mg/dl — ABNORMAL HIGH (ref 70–100)
GLUCOSE, POC: 211 mg/dl — ABNORMAL HIGH (ref 70–100)

## 2023-03-20 LAB — MAGNESIUM: MAGNESIUM: 1.6 mg/dL — ABNORMAL LOW (ref 1.9–2.7)

## 2023-03-20 MED ORDER — PANTOPRAZOLE 40 MG TABLET,DELAYED RELEASE
40.0000 mg | DELAYED_RELEASE_TABLET | Freq: Every day | ORAL | Status: DC
Start: 2023-03-20 — End: 2023-03-21
  Administered 2023-03-20 – 2023-03-21 (×2): 40 mg via ORAL
  Filled 2023-03-20 (×2): qty 1

## 2023-03-20 MED ORDER — MAGNESIUM SULFATE 1 GRAM/100 ML IN DEXTROSE 5 % INTRAVENOUS PIGGYBACK
1.0000 g | INJECTION | INTRAVENOUS | Status: AC
Start: 2023-03-20 — End: 2023-03-20
  Administered 2023-03-20: 0 g via INTRAVENOUS
  Administered 2023-03-20: 1 g via INTRAVENOUS
  Administered 2023-03-20 (×2): 0 g via INTRAVENOUS
  Administered 2023-03-20 (×2): 1 g via INTRAVENOUS
  Filled 2023-03-20: qty 300

## 2023-03-20 MED ORDER — MAGNESIUM OXIDE 400 MG (241.3 MG MAGNESIUM) TABLET
400.0000 mg | ORAL_TABLET | Freq: Two times a day (BID) | ORAL | Status: DC
Start: 2023-03-20 — End: 2023-03-21
  Administered 2023-03-20 – 2023-03-21 (×2): 400 mg via ORAL
  Filled 2023-03-20 (×2): qty 1

## 2023-03-20 MED ORDER — FAMOTIDINE 20 MG TABLET
20.0000 mg | ORAL_TABLET | Freq: Two times a day (BID) | ORAL | Status: DC
Start: 2023-03-20 — End: 2023-03-21
  Administered 2023-03-20 – 2023-03-21 (×3): 20 mg via ORAL
  Filled 2023-03-20 (×3): qty 1

## 2023-03-20 NOTE — OT Treatment (Signed)
[facility]  [address]  [place], [address]  [phone]  (Fax) [phone]  Rehabilitation Services  Occupational Therapy     Patient Name: [name] [name]  Date of Birth: December 14, 1960  Height: Height: 180.3 cm (5\' 11" )  Weight: Weight: 94.5 kg (208 lb 6.4 oz)  Room/Bed: 368/A  Payor: [place] MEDICARE / Plan: [place] MEDICARE ADVANTAGE PPO / Product Type: PPO /     PATIENT DID NOT PARTICIPATE IN THERAPY TODAY DUE TO: PATIENT DECLINED TREATMENT OT went back for second attempt to see patient. Patient stated that he would not participate until "he found out what is going on with his leg".         Coletta Memos, COTA 03/20/2023,15:07

## 2023-03-20 NOTE — Progress Notes (Signed)
[facility]   Progress Note    Auther Chapla  Date of service: 03/20/2023  Date of Admission:  03/12/2023  Hospital Day:  LOS: 6 days     Subjective:  No new issues reported.  The patient denied any somatic complaints at time of the evaluation.  The care plan was reviewed in detail, and all questions/concerns were successfully addressed.    Review of Systems:    As mentioned above.     Objective:      Vital Signs:  Vitals:    03/19/23 2230 03/20/23 0027 03/20/23 0445 03/20/23 0811   BP:  117/72  (!) 119/57   Pulse: 85 82  83   Resp:  20  20   Temp:  36.4 C (97.5 F)  37.4 C (99.3 F)   SpO2:  92%  97%   Weight:   94.5 kg (208 lb 6.4 oz)    Height:       BMI:         I/O:  I/O last 24 hours:    Intake/Output Summary (Last 24 hours) at 03/20/2023 1210  Last data filed at 03/20/2023 0300  Gross per 24 hour   Intake 240 ml   Output 1400 ml   Net -1160 ml     1/2 NS premix infusion, , Intravenous, Continuous  acetaminophen (TYLENOL) tablet, 650 mg, Oral, Q4H PRN  albuterol (PROVENTIL) 2.5 mg / 3 mL (0.083%) neb solution, 2.5 mg, Nebulization, Q4H PRN  alcohol 62 % (NOZIN NASAL SANITIZER) nasal solution, 1 Each, Each Nostril, 2x/day  aluminum-magnesium hydroxide-simethicone (MAG-AL PLUS) 200-200-20 mg per 5 mL oral liquid, 15 mL, Oral, 4x/day PRN  aspirin tablet 325 mg, 325 mg, Oral, Daily  buprenorphine-naloxone (SUBOXONE) 2-0.5mg  per sublingual tablet, 1 Tablet, Sublingual, 2x/day  calcitriol (ROCALTROL) capsule, 0.25 mcg, Oral, Q12H  calcium carbonate (TUMS) 500mg  (200mg  elemental calcium) chewable tablet, 500 mg, Oral, 3x/day PRN  collagenase (SANTYL) 250 unit/gm ointment, , Apply Topically, Daily  Correction/SSIP insulin lispro (HumaLOG) 100 units/mL injection, 0-18 Units, Subcutaneous, 4x/day AC  dextrose (GLUTOSE) 40% oral gel, 15 g, Oral, Q15 Min PRN  dextrose 50% (0.5 g/mL) injection - syringe, 12.5 g, Intravenous, Q15 Min PRN  diphenhydrAMINE (BENADRYL) 50 mg/mL injection, 25 mg, Intravenous, Q6H  PRN  docusate sodium (COLACE) capsule, 100 mg, Oral, 2x/day  ergocalciferol-vitamin D2 (DRISDOL) capsule, 50,000 Units, Oral, M and F  famotidine (PEPCID) tablet, 20 mg, Oral, 2x/day  glucagon (GLUCAGEN DIAGNOSTIC KIT) injection 1 mg, 1 mg, IntraMUSCULAR, Once PRN  guaiFENesin 100mg  per 5mL oral liquid - for cough (expectorant), 200 mg, Oral, Q4H PRN  haloperidol (HALDOL) 5 mg/mL injection, 5 mg, Intravenous, Q6H PRN  heparin 5,000 unit/mL injection, 5,000 Units, Subcutaneous, Q12H  hydrALAZINE (APRESOLINE) injection 10 mg, 10 mg, Intravenous, Q4H PRN  LORazepam (ATIVAN) 2 mg/mL injection, 0.5 mg, Intravenous, Q4H PRN  nitroGLYCERIN (NITROSTAT) sublingual tablet, 0.4 mg, Sublingual, Q5 Min PRN  ondansetron (ZOFRAN) 2 mg/mL injection, 4 mg, Intravenous, Q8H PRN  pantoprazole (PROTONIX) delayed release tablet, 40 mg, Oral, Daily  polyethylene glycol (MIRALAX) oral packet, 17 g, Oral, Daily PRN  tamsulosin (FLOMAX) capsule, 0.4 mg, Oral, Daily after Dinner  traZODone (DESYREL) tablet, 50 mg, Oral, HS PRN  vancomycin 25 mg per mL oral liquid with cherry flavoring, 125 mg, Oral, 4x/day        Physical Exam:    Gen: NAD, awake, alert/oriented x3, answers questions appropriately  HEENT:  Atraumatic, moist mucous membranes  Neck: supple, no TM  Heart:  Normal S1/S2, no extra heart sounds noted  Chest:  Clear to auscultation bilaterally, good aeration to bases  Abdomen:+BS, soft,NT/ND, no rebound/guarding, normal contour  Extr: no c/c/e  Neurological:  Moves all extremities equally  Skin: Skin is warm and dry, no erythema noted    Labs:  Results for orders placed or performed during the hospital encounter of 03/12/23 (from the past 24 hour(s))   CBC/DIFF    Narrative    The following orders were created for panel order CBC/DIFF.  Procedure                               Abnormality         Status                     ---------                               -----------         ------                     CBC WITH OZHY[[id number]]                 Abnormal            Final result               MANUAL DIFFERENTIAL[[id number]]          Abnormal            Final result                 Please view results for these tests on the individual orders.   COMPREHENSIVE METABOLIC PANEL, NON-FASTING   Result Value Ref Range    SODIUM 136 136 - 145 mmol/L    POTASSIUM 3.8 3.5 - 5.1 mmol/L    CHLORIDE 106 98 - 107 mmol/L    CO2 TOTAL 23 21 - 31 mmol/L    ANION GAP 7 4 - 13 mmol/L    BUN 45 (H) 7 - 25 mg/dL    CREATININE 2.95 (H) 0.60 - 1.30 mg/dL    BUN/CREA RATIO 13 6 - 22    ESTIMATED GFR 20 (L) >59 mL/min/1.64m^2    ALBUMIN 2.4 (L) 3.5 - 5.7 g/dL    CALCIUM 7.2 (L) 8.6 - 10.3 mg/dL    GLUCOSE 284 (H) 74 - 109 mg/dL    ALKALINE PHOSPHATASE 52 34 - 104 U/L    ALT (SGPT) <7 (L) 7 - 52 U/L    AST (SGOT) 7 (L) 13 - 39 U/L    BILIRUBIN TOTAL 0.8 0.3 - 1.2 mg/dL    PROTEIN TOTAL 5.2 (L) 6.4 - 8.9 g/dL    ALBUMIN/GLOBULIN RATIO 0.9 0.8 - 1.4    OSMOLALITY, CALCULATED 288 270 - 290 mOsm/kg    CALCIUM, CORRECTED 8.5 (L) 8.9 - 10.8 mg/dL    GLOBULIN 2.8 (L) 2.9 - 5.4    Narrative    Estimated Glomerular Filtration Rate (eGFR) is calculated using the CKD-EPI (2021) equation, intended for patients 39 years of age and older. If gender is not documented or "unknown", there will be no eGFR calculation.     MAGNESIUM   Result Value Ref Range    MAGNESIUM 1.6 (L) 1.9 - 2.7 mg/dL   THYROID STIMULATING HORMONE (SENSITIVE TSH)   Result Value  Ref Range    TSH 0.479 0.450 - 5.330 uIU/mL   FOLATE   Result Value Ref Range    FOLATE 12.6 5.9 - 24.4 ng/mL   CBC WITH DIFF   Result Value Ref Range    WBC 21.5 (H) 3.6 - 10.2 x10^3/uL    RBC 3.91 (L) 4.06 - 5.63 x10^6/uL    HGB 10.5 (L) 12.5 - 16.3 g/dL    HCT 65.7 (L) 84.6 - 47.1 %    MCV 84.1 73.0 - 96.2 fL    MCH 26.9 23.8 - 33.4 pg    MCHC 32.0 (L) 32.5 - 36.3 g/dL    RDW 96.2 (H) 95.2 - 16.2 %    PLATELETS 255 140 - 440 x10^3/uL    MPV 8.1 7.4 - 11.4 fL   MANUAL DIFFERENTIAL   Result Value Ref Range    WBC 21.5 x10^3/uL     NEUTROPHIL % 86 (H) 40 - 76 %    LYMPHOCYTE % 8 (L) 25 - 45 %    MONOCYTE % 5 0 - 12 %    EOSINOPHIL % 1 0 - 7 %    BASOPHIL %      METAMYELOCYTE %      MYELOCYTE %      PROMYELOCYTE %      BAND %      BLAST %      OTHER %      NEUTROPHIL ABSOLUTE 18.49 (H) 1.80 - 8.40 x10^3/uL    LYMPHOCYTE ABSOLUTE 1.72 1.10 - 5.00 x10^3/uL    MONOCYTE ABSOLUTE 1.08 0.00 - 1.30 x10^3/uL    EOSINOPHIL ABSOLUTE 0.22 0.00 - 0.80 x10^3/uL    BASOPHIL ABSOLUTE      METAMYELOCYTE ABSOLUTE      MYELOCYTE ABSOLUTE      PROMYELOCYTE ABSOLUTE      BLAST ABSOLUTE      OTHER CELL ABSOLUTE      ANISOCYTOSIS 1+ (10-25%)     POLYCHROMASIA      POIKILOCYTOSIS      BASOPHILIC STIPPLING      MICROCYTOSIS      MACROCYTOSIS      ROULEAUX      SCHISTOCYTES      SPHEROCYTES      TARGET CELLS      TEARDROP CELLS      OVALOCYTE (ELLIPTOCYTE)      CRENATED RED CELLS      STOMATOCYTES      ACANTHOCYTES (SPUR CELL)      ECHINOCYTE (BURR CELL)      BLISTER CELLS      RBC AGGLUTINATES      HOWELL JOLLY BODIES      ATYPICAL LYMPHOCYTES      TOXIC GRANULATION      DOHLE BODIES      TOXIC VACUOLIZATION      AUER RODS      BASKET CELLS      HYPERSEGMENTATION      LARGE PLATELETS      PLATELET CLUMPS      PLATELET MORPHOLOGY COMMENT Normal     BANDS NEUTROPHILS MANUAL      BAND ABSOLUTE      NEUTROPHILS MANUAL 86     LYMPHOCYTES MANUAL 8     MONOCYTES MANUAL 5     EOSINOPHILS MANUAL 1     BASOPHILS MANUAL      PROMYELOCYTES MANUAL      MYELOCYTES MANUAL      METAMYELOCYTES MANUAL  BLASTS MANUAL      TOTAL CELLS COUNTED [#] IN BLOOD 100     OTHER CELLS MANUAL      NUCLEATED RBC MANUAL      PLASMA CELL %      PLASMA CELL ABSOLUE      PLASMA CELLS MANUAL      HYPOCHROMASIA     POC BLOOD GLUCOSE (RESULTS)   Result Value Ref Range    GLUCOSE, POC 230 (H) 70 - 100 mg/dl   POC BLOOD GLUCOSE (RESULTS)   Result Value Ref Range    GLUCOSE, POC 148 (H) 70 - 100 mg/dl      Microbiology:  Hospital Encounter on 03/12/23 (from the past 96 hour(s))   C. DIFFICILE PCR     Collection Time: 03/18/23  4:33 PM    Specimen: Stool   Culture Result Status    C. DIFFICILE TOXIN GENE, PCR Positive (A) Final    PRESUMPTIVE 027/NAP1/BI Positive (A) Final    Narrative    Error^5011^Post-run analysis error^Error 5011: Signal loss detected in the amplification curve for analyte [SPC]. 15.2 decrease in signal with 21.1% decrease at cycle 27.^[id number]  [id number] analysis error^Error 5011: Signal loss detected in the amplification curve for analyte [SPC]. 15.2 decrease in signal with 21.1% decrease at cycle 27.^[id number]   CLOSTRIDIUM DIFFICILE TOXIN A/B    Collection Time: 03/18/23  4:33 PM    Specimen: Stool   Culture Result Status    CLOSTRIDIUM DIFFICILE TOXIN A/B Positive (A) Final     Assessment/ Plan:   Acute Metabolic Encephalopathy, resolved  Neurocognitive Disorder  -CT Head unremarkable for any acute intracranial processes  -continue to assess for/treat underlying infectious process  -monitor closely     Abdominal Pain  Acute C. Dif Colitis  -afebrile, hemodynamically stable, WBC 21.5  -antiemetic Rx continue  -advance diet as tolerated  -conservative pain management strategy in place     Angioedema, resolved  -possibly secondary to Levaquin Rx exposure  -monitor closely    .Acute vs Kidney Injury, stage IIIb  -Cr 4.0--> 3.35  -avoid/limit nephrotoxic agents/ACEI/ARBs, NSAIDs, contrast media  -renally adjust medication regimen  -continue gentle hydration per Nephrology recommendations  -a.m. CMP to follow     Hypomagnesemia  -Mg+ level = 1.6  -IV supplementation planned  -AM level to follow     BPH  -continue w/ outpatient regimen  -stable; no active issues     Opiate Dependence  -continue Suboxone Rx  -stable; no active issues    Deconditioned State  -PT/OT as per schedule  -out of bed to chair encouraged     Code Status: Full Code    Diet:  Diabetic    DVT Prophylaxis   -SCDs/heparin    Disposition:  Pending clinical course    I have spent 55 minutes care for this  patient with over half in discussion of the diagnosis and the importance of compliance with the treatment plan.  The remainder of the time was spent examining the patient and discussing the plan of care patient's family/care team    Lalla Brothers, MD    This note was partially generated using MModal Fluency Direct system, and there may be some incorrect words, spellings, and punctuation that were not noted in checking the note before saving.

## 2023-03-20 NOTE — OT Treatment (Signed)
[facility]  [address]  [place], [address]  [phone]  (Fax) [phone]  Rehabilitation Services  Occupational Therapy     Patient Name: [name] [name]  Date of Birth: [birth year]  Height: Height: 180.3 cm (5\' 11" )  Weight: Weight: 94.5 kg (208 lb 6.4 oz)  Room/Bed: 368/A  Payor: [place] MEDICARE / Plan: [place] MEDICARE ADVANTAGE PPO / Product Type: PPO /     PATIENT DID NOT PARTICIPATE IN THERAPY TODAY DUE TO: PATIENT DECLINED TREATMENT.        Coletta Memos, COTA 03/20/2023,13:28

## 2023-03-20 NOTE — Progress Notes (Signed)
[place] [facility]  Nephrology Consult Progress Note           [name] [name], [name] [name]  Date of Admission:  03/12/2023  Date of Birth:  03-18-1961  Date of Service:  03/20/2023    Hospital Day:  LOS: 6 days       HPI/Subjective:   [name] [name] is a 62 y.o. male with chronic kidney disease stage 4 presented to the hospital with acute on chronic due to urine retention prerenal.    Patient does not have edema .  Patient denies chest pain or shortness of breath.  Review of Systems  Systematic review of 12 organ systems was negative except what mentioned in in the HPI.  Vital Signs:  Temp (24hrs) Max:38.1 C (100.5 F)      Temperature: 37 C (98.6 F)  BP (Non-Invasive): (!) 89/54  MAP (Non-Invasive): 96 mmHG  Heart Rate: 71  Respiratory Rate: 18  SpO2: 97 %  I/O:  I/O last 24 hours:    Intake/Output Summary (Last 24 hours) at 03/20/2023 1707  Last data filed at 03/20/2023 1604  Gross per 24 hour   Intake 200 ml   Output 1400 ml   Net -1200 ml     Examination  Patient is alert awake and oriented not in acute distress.  Normal mood and affect.  Cardiovascular system: Regular rate and rhythm no murmurs rubs or gallops. No chest wall tenderness  Lungs: Clear to auscultation bilaterally no wheezing no rhonchi.  Abdomen soft nontender nondistended.  Extremities no clubbing cyanosis or edema.  Neuro exam: EOMI, normal speech  Current Medications:  1/2 NS premix infusion, , Intravenous, Continuous  acetaminophen (TYLENOL) tablet, 650 mg, Oral, Q4H PRN  albuterol (PROVENTIL) 2.5 mg / 3 mL (0.083%) neb solution, 2.5 mg, Nebulization, Q4H PRN  alcohol 62 % (NOZIN NASAL SANITIZER) nasal solution, 1 Each, Each Nostril, 2x/day  aluminum-magnesium hydroxide-simethicone (MAG-AL PLUS) 200-200-20 mg per 5 mL oral liquid, 15 mL, Oral, 4x/day PRN  aspirin tablet 325 mg, 325 mg, Oral, Daily  buprenorphine-naloxone (SUBOXONE) 2-0.5mg  per sublingual tablet, 1 Tablet, Sublingual, 2x/day  calcitriol (ROCALTROL) capsule, 0.25 mcg, Oral,  Q12H  calcium carbonate (TUMS) 500mg  (200mg  elemental calcium) chewable tablet, 500 mg, Oral, 3x/day PRN  collagenase (SANTYL) 250 unit/gm ointment, , Apply Topically, Daily  Correction/SSIP insulin lispro (HumaLOG) 100 units/mL injection, 0-18 Units, Subcutaneous, 4x/day AC  dextrose (GLUTOSE) 40% oral gel, 15 g, Oral, Q15 Min PRN  dextrose 50% (0.5 g/mL) injection - syringe, 12.5 g, Intravenous, Q15 Min PRN  diphenhydrAMINE (BENADRYL) 50 mg/mL injection, 25 mg, Intravenous, Q6H PRN  docusate sodium (COLACE) capsule, 100 mg, Oral, 2x/day  ergocalciferol-vitamin D2 (DRISDOL) capsule, 50,000 Units, Oral, M and F  famotidine (PEPCID) tablet, 20 mg, Oral, 2x/day  glucagon (GLUCAGEN DIAGNOSTIC KIT) injection 1 mg, 1 mg, IntraMUSCULAR, Once PRN  guaiFENesin 100mg  per 5mL oral liquid - for cough (expectorant), 200 mg, Oral, Q4H PRN  haloperidol (HALDOL) 5 mg/mL injection, 5 mg, Intravenous, Q6H PRN  heparin 5,000 unit/mL injection, 5,000 Units, Subcutaneous, Q12H  hydrALAZINE (APRESOLINE) injection 10 mg, 10 mg, Intravenous, Q4H PRN  LORazepam (ATIVAN) 2 mg/mL injection, 0.5 mg, Intravenous, Q4H PRN  nitroGLYCERIN (NITROSTAT) sublingual tablet, 0.4 mg, Sublingual, Q5 Min PRN  ondansetron (ZOFRAN) 2 mg/mL injection, 4 mg, Intravenous, Q8H PRN  pantoprazole (PROTONIX) delayed release tablet, 40 mg, Oral, Daily  polyethylene glycol (MIRALAX) oral packet, 17 g, Oral, Daily PRN  tamsulosin (FLOMAX) capsule, 0.4 mg, Oral, Daily after Dinner  traZODone (DESYREL)  tablet, 50 mg, Oral, HS PRN  vancomycin 25 mg per mL oral liquid with cherry flavoring, 125 mg, Oral, 4x/day      Labs:  BMP:   136 (07/11 0437) 106 (07/11 0437) 45* (07/11 7829)    /     175* (07/11 0437)   3.8 (07/11 0437) 23 (07/11 0437) 3.35* (07/11 0437) \             CBC:     21.5*, 21.5 (07/11 0437) \   10.5* (07/11 5621) /   255 (07/11 3086)      / 32.9* (07/11 0437) \          Microbiology:  Hospital Encounter on 03/12/23 (from the past 96 hour(s))   C.  DIFFICILE PCR    Collection Time: 03/18/23  4:33 PM    Specimen: Stool   Culture Result Status    C. DIFFICILE TOXIN GENE, PCR Positive (A) Final    PRESUMPTIVE 027/NAP1/BI Positive (A) Final    Narrative    Error^5011^Post-run analysis error^Error 5011: Signal loss detected in the amplification curve for analyte [SPC]. 15.2 decrease in signal with 21.1% decrease at cycle 27.^[id number]  [id number] analysis error^Error 5011: Signal loss detected in the amplification curve for analyte [SPC]. 15.2 decrease in signal with 21.1% decrease at cycle 27.^[id number]   CLOSTRIDIUM DIFFICILE TOXIN A/B    Collection Time: 03/18/23  4:33 PM    Specimen: Stool   Culture Result Status    CLOSTRIDIUM DIFFICILE TOXIN A/B Positive (A) Final       Imaging:   ECG 12 LEAD  Normal sinus rhythm  Left axis deviation  Low voltage QRS  Inferior infarct (cited on or before 12-Mar-2023)  Prolonged QT  Abnormal ECG  When compared with ECG of 12-Mar-2023 15:09,  No significant change was found  Confirmed by Earnie Larsson ([id number]) on 03/17/2023 10:25:18 AM    Assessment/ Plan:   Active Hospital Problems   (*Primary Problem)    Diagnosis    *AMS (altered mental status)    Cerebrovascular accident (CVA), unspecified mechanism (CMS HCC)    Leukocytosis, unspecified type    CKD (chronic kidney disease) stage 4, GFR 15-29 ml/min (CMS HCC)       Chronic kidney disease stage 4, acute on chronic kidney disease  -obstructive uropathy/retention plus prerenal  -baseline Cr  2.7  -SCR 3.35* (07/11 0437) condition is stable, could be this is his new baseline  -keep Foley  -keep Flomax  -DC IV fluids  -serology workup is negative  -no need for dialysis    Acid-base , better   Electrolytes , replace magnesium    Hypocalcemia  -continue vitamin-D and calcitriol, patient has secondary hyperparathyroidism.      MY ORDERS LAST 24 (24h ago, onward)      None              [name], MD, FASN, 03/20/2023, 17:07

## 2023-03-20 NOTE — PT Treatment (Signed)
[facility]  [address]  [place], [address]  [phone]  (Fax) [phone]  Rehabilitation Services  Physical Therapy     Patient Name: [name] [name]  Date of Birth: 10/19/1960  Height: Height: 180.3 cm (5\' 11" )  Weight: Weight: 94.5 kg (208 lb 6.4 oz)  Room/Bed: 368/A  Payor: [place] MEDICARE / Plan: [place] MEDICARE ADVANTAGE PPO / Product Type: PPO /     PATIENT DID NOT PARTICIPATE IN THERAPY TODAY DUE TO: PATIENT DECLINED TREATMENT        Lane Hacker, PTA 03/20/2023,12:48

## 2023-03-21 LAB — CBC
HCT: 34.1 % — ABNORMAL LOW (ref 36.7–47.1)
HGB: 10.8 g/dL — ABNORMAL LOW (ref 12.5–16.3)
MCH: 26.8 pg (ref 23.8–33.4)
MCHC: 31.7 g/dL — ABNORMAL LOW (ref 32.5–36.3)
MCV: 84.6 fL (ref 73.0–96.2)
MPV: 8.3 fL (ref 7.4–11.4)
PLATELETS: 257 10*3/uL (ref 140–440)
RBC: 4.03 10*6/uL — ABNORMAL LOW (ref 4.06–5.63)
RDW: 17.1 % — ABNORMAL HIGH (ref 12.1–16.2)
WBC: 20.4 10*3/uL — ABNORMAL HIGH (ref 3.6–10.2)

## 2023-03-21 LAB — COMPREHENSIVE METABOLIC PANEL, NON-FASTING
ALBUMIN/GLOBULIN RATIO: 0.9 (ref 0.8–1.4)
ALBUMIN: 2.6 g/dL — ABNORMAL LOW (ref 3.5–5.7)
ALKALINE PHOSPHATASE: 61 U/L (ref 34–104)
ALT (SGPT): <7 U/L — ABNORMAL LOW (ref 7–52)
ANION GAP: 9 mmol/L (ref 4–13)
AST (SGOT): 9 U/L — ABNORMAL LOW (ref 13–39)
BILIRUBIN TOTAL: 0.6 mg/dL (ref 0.3–1.2)
BUN/CREA RATIO: 12 (ref 6–22)
BUN: 42 mg/dL — ABNORMAL HIGH (ref 7–25)
CALCIUM, CORRECTED: 8.7 mg/dL — ABNORMAL LOW (ref 8.9–10.8)
CALCIUM: 7.6 mg/dL — ABNORMAL LOW (ref 8.6–10.3)
CHLORIDE: 106 mmol/L (ref 98–107)
CO2 TOTAL: 22 mmol/L (ref 21–31)
CREATININE: 3.47 mg/dL — ABNORMAL HIGH (ref 0.60–1.30)
ESTIMATED GFR: 19 mL/min/{1.73_m2} — ABNORMAL LOW (ref >59)
GLOBULIN: 3 (ref 2.9–5.4)
GLUCOSE: 186 mg/dL — ABNORMAL HIGH (ref 74–109)
OSMOLALITY, CALCULATED: 289 mOsm/kg (ref 270–290)
POTASSIUM: 3.6 mmol/L (ref 3.5–5.1)
PROTEIN TOTAL: 5.6 g/dL — ABNORMAL LOW (ref 6.4–8.9)
SODIUM: 137 mmol/L (ref 136–145)

## 2023-03-21 LAB — POC BLOOD GLUCOSE (RESULTS)
GLUCOSE, POC: 174 mg/dl — ABNORMAL HIGH (ref 70–100)
GLUCOSE, POC: 165 mg/dl — ABNORMAL HIGH (ref 70–100)
GLUCOSE, POC: 230 mg/dl — ABNORMAL HIGH (ref 70–100)

## 2023-03-21 LAB — MAGNESIUM: MAGNESIUM: 2.2 mg/dL (ref 1.9–2.7)

## 2023-03-21 MED ORDER — FAMOTIDINE 20 MG TABLET
20.0000 mg | ORAL_TABLET | Freq: Two times a day (BID) | ORAL | Status: AC
Start: 2023-03-21 — End: ?

## 2023-03-21 MED ORDER — TAMSULOSIN 0.4 MG CAPSULE
0.4000 mg | ORAL_CAPSULE | Freq: Every evening | ORAL | Status: AC
Start: 2023-03-21 — End: ?

## 2023-03-21 MED ORDER — ERGOCALCIFEROL (VITAMIN D2) 1,250 MCG (50,000 UNIT) CAPSULE
50000.0000 [IU] | ORAL_CAPSULE | ORAL | Status: AC
Start: 2023-03-24 — End: 2023-04-22

## 2023-03-21 MED ORDER — VANCOMYCIN 125 MG CAPSULE
125.0000 mg | ORAL_CAPSULE | Freq: Four times a day (QID) | ORAL | Status: AC
Start: 2023-03-21 — End: 2023-04-04

## 2023-03-21 MED ORDER — CALCITRIOL 0.25 MCG CAPSULE
0.2500 ug | ORAL_CAPSULE | Freq: Two times a day (BID) | ORAL | Status: AC
Start: 2023-03-21 — End: ?

## 2023-03-21 MED ORDER — PANTOPRAZOLE 40 MG TABLET,DELAYED RELEASE
40.0000 mg | DELAYED_RELEASE_TABLET | Freq: Every day | ORAL | Status: AC
Start: 2023-03-22 — End: ?

## 2023-03-21 NOTE — Care Management Notes (Signed)
[facility]  Care Management Note    Patient Name: [name] [name]  Date of Birth: 1961-03-01  Sex: male  Date/Time of Admission: 03/12/2023 12:39 PM  Room/Bed: 368/A  Payor: [place] MEDICARE / Plan: [place] MEDICARE ADVANTAGE PPO / Product Type: PPO /    LOS: 7 days   Primary Care Providers:  Sherie Don, MD, MD (General)    Admitting Diagnosis:  AMS (altered mental status) [R41.82]    Assessment:   CM called [facility] to transport pt back to [facility]. They reported that they should be here within an hour.    Discharge Plan:  SNF Return (Medicare certified) (code 3)      The patient will continue to be evaluated for developing discharge needs.     Case Manager: Yaakov Guthrie, [place]  Phone: [phone]

## 2023-03-21 NOTE — PT Treatment (Signed)
[facility]  [address]  [place], [address]  [phone]  (Fax) [phone]  Rehabilitation Services  Physical Therapy     Patient Name: [name] [name]  Date of Birth: 1961-08-28  Height: Height: 180.3 cm (5\' 11" )  Weight: Weight: 94 kg (207 lb 4.8 oz)  Room/Bed: 368/A  Payor: [place] MEDICARE / Plan: [place] MEDICARE ADVANTAGE PPO / Product Type: PPO /     PATIENT DID NOT PARTICIPATE IN THERAPY TODAY DUE TO: PATIENT DECLINED TREATMENT        Lane Hacker, PTA 03/21/2023,11:34

## 2023-03-21 NOTE — Discharge Summary (Signed)
[facility]  DISCHARGE SUMMARY    PATIENT NAME:  [name] [name], [name] [name]  MRN:  [id number]  DOB:  [birth year]    ENCOUNTER DATE:  03/12/2023  INPATIENT ADMISSION DATE: 03/14/2023  DISCHARGE DATE:  03/21/2023    ATTENDING PHYSICIAN: Lalla Brothers, MD  SERVICE: PRN HOSPITALIST 2  PRIMARY CARE PHYSICIAN: Sherie Don, MD       No lay caregiver identified.    PRIMARY DISCHARGE DIAGNOSIS: AMS (altered mental status)  Active Hospital Problems    Diagnosis Date Noted    CKD (chronic kidney disease) stage 4, GFR 15-29 ml/min (CMS HCC) [N18.4] 03/17/2023      Resolved Hospital Problems    Diagnosis     Principal Problem: AMS (altered mental status) [R41.82]     Cerebrovascular accident (CVA), unspecified mechanism (CMS HCC) [I63.9]     Leukocytosis, unspecified type [D72.829]      Active Non-Hospital Problems    Diagnosis Date Noted    Abscess of right shoulder 04/01/2020    Preseptal cellulitis of left eye 08/12/2019    Sepsis due to methicillin resistant Staphylococcus aureus (MRSA) without acute organ dysfunction (CMS HCC) 08/12/2019    Abscess of left periorbital region 08/11/2019           Current Discharge Medication List        START taking these medications.        Details   calcitrioL 0.25 mcg Capsule  Commonly known as: ROCALTROL   0.25 mcg, Oral, EVERY 12 HOURS  Refills: 0     ergocalciferol (vitamin D2) 1,250 mcg (50,000 unit) Capsule  Commonly known as: DRISDOL  Start taking on: March 24, 2023   50,000 Units, Oral, EVERY MO AND FR  Refills: 0     famotidine 20 mg Tablet  Commonly known as: PEPCID   20 mg, Oral, 2 TIMES DAILY  Refills: 0     pantoprazole 40 mg Tablet, Delayed Release (E.C.)  Commonly known as: PROTONIX  Start taking on: March 22, 2023   40 mg, Oral, DAILY  Refills: 0     tamsulosin 0.4 mg Capsule  Commonly known as: FLOMAX   0.4 mg, Oral, EVERY EVENING AFTER DINNER  Refills: 0     vancomycin 125 mg Capsule  Commonly known as: VANCOCIN   125 mg, Oral, 4 TIMES DAILY  Refills: 0             CONTINUE these medications - NO CHANGES were made during your visit.        Details   acetaminophen 325 mg Tablet  Commonly known as: TYLENOL   650 mg, Oral, EVERY 6 HOURS PRN, fever  Refills: 0     aspirin 325 mg Tablet   325 mg, Oral, DAILY  Refills: 0     buprenorphine-naloxone 2-0.5 mg Film  Commonly known as: SUBOXONE   1 Film, Sublingual, 3 TIMES DAILY  Refills: 0     docusate sodium 100 mg Capsule  Commonly known as: COLACE   100 mg, Oral, 2 TIMES DAILY  Refills: 0     Florastor 250 mg Capsule  Generic drug: Saccharomyces boulardii   250 mg, Oral, 2 TIMES DAILY  Refills: 0     insulin lispro 100 unit/mL Solution  Commonly known as: HumaLOG   15 Units, Subcutaneous, 3 TIMES DAILY BEFORE MEALS  Refills: 0     LANTUS SOLOSTAR U-100 INSULIN SUBQ   Subcutaneous, 65 units subcutaneously at bedtime  Refills: 0     protein  supplement Liquid  Commonly known as: PROMOD   30 mL, Oral  Refills: 0     TUMS ORAL   Oral  Refills: 0     Vitamin D-3 125 mcg (5,000 unit) Tablet  Generic drug: cholecalciferol (Vitamin D3)   5,000 Units, Oral, DAILY  Refills: 0            STOP taking these medications.      baclofen 10 mg Tablet  Commonly known as: LIORESAL     Cimetidine 300 mg Tablet  Commonly known as: TAGAMET     ertapenem 1 gram Recon Soln infusion  Commonly known as: Pincus Sanes     Farxiga 10 mg Tablet  Generic drug: dapagliflozin propanediol     levoFLOXacin 500 mg Tablet  Commonly known as: LEVAQUIN     Voltaren Arthritis Pain 1 % Gel  Generic drug: diclofenac sodium            Discharge med list refreshed?  YES     Allergies   Allergen Reactions    Levaquin [Levofloxacin] Angioedema    Penicillins     Tramadol      HOSPITAL PROCEDURE(S):   No orders of the defined types were placed in this encounter.      REASON FOR HOSPITALIZATION AND HOSPITAL COURSE   BRIEF HPI:  This is a 62 y.o., male admitted for altered mental status/AKI        BRIEF HOSPITAL NARRATIVE:   It was elected to admit the patient for further evaluation  in the setting of multiple medical issues.  There was a concern for angioedema secondary to (exposure and the patient was bonded well to systemic corticosteroid Rx.  It was elected to discontinue Levaquin Rx to add that medication to his list of allergies.  The patient had altered mentation in the setting of several metabolic disturbances.  He was noted to have acute renal failure and was evaluated by Nephrology team while he was admitted.  He was medication regimen was adjusted and tamsulosin Rx was added to his regimen with improvement of his renal function.  The patient was found to have Clostridium difficile colitis and responded well to vancomycin Rx with resolution of his visual hallucinations/confusion.  He reported complete resolution of his presenting complaints and endorsed being in his usual state of health on the day of discharge.  The patient will be discharged to the long-term care facility at which she resides for continued management.  He will follow up with Nephrology as per schedule for continued management of her chronic kidney disease.      TRANSITION/POST DISCHARGE CARE/PENDING TESTS/REFERRALS:  Nephrology/PCP    CONDITION ON DISCHARGE:  A. Ambulation: Up with assistance only  B. Self-care Ability: With partial assistance  C. Cognitive Status Alert and Oriented x 3  D. Code status at discharge:   Full code      LINES/DRAINS/WOUNDS AT DISCHARGE:   Patient Lines/Drains/Airways Status       Active Line / Dialysis Catheter / Dialysis Graft / Drain / Airway / Wound       Name Placement date Placement time Site Days    Peripheral IV Anterior;Left;Upper Arm 03/16/23  0800  -- 5    Foley Catheter 03/14/23  1204  -- 6    Wound (Non-Surgical) Left Heel 08/11/19  --  -- 1318    Wound  Other (comment) Left;Anterior Knee 03/13/23  0125  -- 8    Wound  Other (comment) Left;Lower;Anterior Leg 03/13/23  0125  --  8    Wound  Other (comment) Lower;Right;Anterior Leg 03/13/23  0125  -- 8                     DISCHARGE DISPOSITION:  Skilled Nursing Unit  DISCHARGE INSTRUCTIONS:  Post-Discharge Follow Up Appointments       Follow up with Sherie Don, MD in 1 week(s)    Phone: [phone]    Where: [address] [place], [place] [address]    Follow up with [name], MD in 2 week(s)    Phone: [phone]    Where: [facility]      Monday May 19, 2023    New Patient Visit with Ernestine Conrad, MD at  3:00 PM      Sports Medicine, [facility], [facility]  [address]  [place] [address]  [phone]             RETURN TO WORK/SCHOOL     Patient May Return to Work: 03/13/2023           Lalla Brothers, MD    Copies sent to Care Team         Relationship Specialty Notifications Start End    Sherie Don, MD PCP - General FAMILY MEDICINE  09/22/22     Phone: [phone] Fax: [phone]         [address] [place] [address]            Referring providers can utilize [email or web address] to access their referred [facility] patient's information.

## 2023-03-21 NOTE — OT Treatment (Signed)
[facility]  [address]  [place], [address]  [phone]  (Fax) [phone]  Rehabilitation Services  Occupational Therapy     Patient Name: [name] [name]  Date of Birth: [birth year]  Height: Height: 180.3 cm (5\' 11" )  Weight: Weight: 94 kg (207 lb 4.8 oz)  Room/Bed: 368/A  Payor: [place] MEDICARE / Plan: [place] MEDICARE ADVANTAGE PPO / Product Type: PPO /     PATIENT DID NOT PARTICIPATE IN THERAPY TODAY DUE TO: PATIENT DECLINED TREATMENT        Hildred Laser, OT 03/21/2023,11:04

## 2023-03-21 NOTE — Care Plan (Signed)
Problem: Adult Inpatient Plan of Care  Goal: Plan of Care Review  Outcome: Ongoing (see interventions/notes)  Goal: Patient-Specific Goal (Individualized)  Outcome: Ongoing (see interventions/notes)  Flowsheets (Taken 03/20/2023 2000)  Individualized Care Needs: monitor VS/Labs  Anxieties, Fears or Concerns: none voiced  Patient-Specific Goals (Include Timeframe):   DC when stab   e/[place]  Plan of Care Reviewed With: patient  Goal: Absence of Hospital-Acquired Illness or Injury  Outcome: Ongoing (see interventions/notes)  Intervention: Identify and Manage Fall Risk  Recent Flowsheet Documentation  Taken 03/21/2023 0007 by Marcelo Baldy, RN  Safety Promotion/Fall Prevention:   activity supervised   fall prevention program maintained   nonskid shoes/slippers when out of bed   safety round/check completed  Taken 03/20/2023 2000 by Marcelo Baldy, RN  Safety Promotion/Fall Prevention:   activity supervised   fall prevention program maintained   nonskid shoes/slippers when out of bed   safety round/check completed  Intervention: Prevent Skin Injury  Recent Flowsheet Documentation  Taken 03/20/2023 2300 by Marcelo Baldy, RN  Skin Protection: adhesive use limited  Taken 03/20/2023 2200 by Marcelo Baldy, RN  Body Position: neutral head position, midline maintained  Taken 03/20/2023 2000 by Marcelo Baldy, RN  Body Position: foot of bed elevated  Intervention: Prevent and Manage VTE (Venous Thromboembolism) Risk  Recent Flowsheet Documentation  Taken 03/20/2023 2000 by Marcelo Baldy, RN  VTE Prevention/Management: anticoagulant therapy maintained  Intervention: Prevent Infection  Recent Flowsheet Documentation  Taken 03/21/2023 0007 by Marcelo Baldy, RN  Infection Prevention: single patient room provided  Taken 03/20/2023 2000 by Marcelo Baldy, RN  Infection Prevention:   promote handwashing   rest/sleep promoted  Goal: Optimal Comfort and Wellbeing  Outcome: Ongoing (see  interventions/notes)  Intervention: Provide Person-Centered Care  Recent Flowsheet Documentation  Taken 03/20/2023 2000 by Marcelo Baldy, RN  Trust Relationship/Rapport:   care explained   choices provided   thoughts/feelings acknowledged  Goal: Rounds/Family Conference  Outcome: Ongoing (see interventions/notes)     Problem: Health Knowledge, Opportunity to Enhance (Adult,Obstetrics,Pediatric)  Goal: Knowledgeable about Health Subject/Topic  Description: Patient will demonstrate the desired outcomes by discharge/transition of care.  Outcome: Ongoing (see interventions/notes)     Problem: Fall Injury Risk  Goal: Absence of Fall and Fall-Related Injury  Outcome: Ongoing (see interventions/notes)  Intervention: Promote Injury-Free Environment  Recent Flowsheet Documentation  Taken 03/21/2023 0007 by Marcelo Baldy, RN  Safety Promotion/Fall Prevention:   activity supervised   fall prevention program maintained   nonskid shoes/slippers when out of bed   safety round/check completed  Taken 03/20/2023 2000 by Marcelo Baldy, RN  Safety Promotion/Fall Prevention:   activity supervised   fall prevention program maintained   nonskid shoes/slippers when out of bed   safety round/check completed     Problem: Skin Injury Risk Increased  Goal: Skin Health and Integrity  Outcome: Ongoing (see interventions/notes)  Intervention: Optimize Skin Protection  Recent Flowsheet Documentation  Taken 03/20/2023 2300 by Marcelo Baldy, RN  Pressure Reduction Techniques: Frequent weight shifting encouraged  Skin Protection: adhesive use limited  Taken 03/20/2023 2000 by Marcelo Baldy, RN  Pressure Reduction Techniques: Frequent weight shifting encouraged  Activity Management: ROM, active encouraged

## 2023-03-21 NOTE — Care Management Notes (Signed)
[facility]  Care Management Initial Evaluation    Patient Name: [name] [name]  Date of Birth: 06-02-1961  Sex: male  Date/Time of Admission: 03/12/2023 12:39 PM  Room/Bed: 368/A  Payor: [place] MEDICARE / Plan: [place] MEDICARE ADVANTAGE PPO / Product Type: PPO /   Primary Care Providers:  Sherie Don, MD, MD (General)    Pharmacy Info:   Preferred Pharmacy       [facility]    [address] [place] [address]    Phone: [phone] Fax: [phone]    Hours: Monday-Friday 7:30AM-6PM, Saturday 10AM-2PM, Sunday Closed    [id number] - [place], [place] - [address]    [address] [place] [address]    Phone: [phone] Fax: [phone]    Hours: Not open 24 hours          Emergency Contact Info:   Extended Emergency Contact Information  Primary Emergency Contact: Gott,Jaycee ADAM  Address: [address]           [place], [place] [address] [place]  Mobile Phone: [phone]  Relation: Son    History:   Wanda Boyde is a 62 y.o., male, admitted 03/12/23.    Height/Weight: 180.3 cm (5\' 11") / 94 kg (207 lb 4.8 oz)     LOS: 7 days   Admitting Diagnosis: AMS (altered mental status) [R41.82]    Assessment:      07 /12/24 1014   Assessment Details   Assessment Type Admission   Date of Care Management Update 03/21/23   Readmission   Is this a readmission? No   Insurance Information/Type   Insurance type Medicare   Employment/Financial   Patient has Prescription Coverage?  Yes        Name of Insurance Coverage for Medications [place], [place]   Financial Concerns none   Living Environment   Select an age group to open "lives with" row.  Adult   Lives With facility resident   Living Arrangements *nursing home   Able to Return to Prior Arrangements yes   IEP and/or 504 Plan? No   Home Safety   Home Assessment: No Problems Identified   Home Accessibility no concerns   Custody and Legal Status   Do you have a court appointed  guardian/conservator? No   Are you an emancipated minor? No   Custody Issues? No   Paternity Affidavit Requested? No   Care Management Plan   Discharge Planning Status initial meeting   Projected Discharge Date 03/22/23   Discharge plan discussed with: Patient   CM will evaluate for rehabilitation potential no   Discharge Needs Assessment   Outpatient/Agency/Support Group Needs skilled nursing facility   Equipment Currently Used at Home none   Equipment Needed After Discharge none   Discharge Facility/Level of Care Needs SNF Return (Medicare certified)(code 3)   Transportation Available ambulance   Referral Information   Admission Type inpatient   Arrived From skilled nursing facility   Skilled Nursing Facility Other - See comments  [facility])     Initial CM assessment completed. Pt admitted for altered mental status on 03/12/23.Pt is a long term resident at [facility]. He will return there upon discharge. Pt is not interested in working with therapy, He told [place], liaison for [facility], that he would not participate in therapy at the nursing home. Pt will be returning to [facility] under long term care medicaid.  Discharge Plan:  SNF Return (Medicare certified) (code 3)      The patient will continue to be evaluated for developing discharge needs.     Case Manager: Yaakov Guthrie, [place]  Phone: [phone]

## 2023-03-21 NOTE — Telemedicine Progress Note (Signed)
[name] [name] MEDICINE [facility]  Nephrology Consult Progress Note       E-Consult Note      [name] [name]  05-23-1961  62 y.o., male  Date of Service: 03/21/2023    Provider that requested consult:  HOSPITALIST SERVICE        Reason for Consult:  Renal failure        Relevant Components of Epic chart/labs/radiology reviewed: yes        Recommendations:  See below      Time Spent:  5 minutes      [name], MD                 [name] [name]  Date of Admission:  03/12/2023  Date of Birth:  1961/06/29  Date of Service:  03/21/2023    Hospital Day:  LOS: 7 days       HPI/Subjective:   [name] [name] is a 62 y.o. male with chronic kidney disease stage 4 presented to the hospital with acute on chronic due to urine retention + prerenal.      Vital Signs:  Temp (24hrs) Max:37.3 C (99.2 F)      Temperature: 36.4 C (97.6 F)  BP (Non-Invasive): 135/64  MAP (Non-Invasive): 83 mmHG  Heart Rate: 82  Respiratory Rate: 18  SpO2: 95 %  I/O:  I/O last 24 hours:    Intake/Output Summary (Last 24 hours) at 03/21/2023 1107  Last data filed at 03/21/2023 1041  Gross per 24 hour   Intake 760 ml   Output 1550 ml   Net -790 ml       Current Medications:  acetaminophen (TYLENOL) tablet, 650 mg, Oral, Q4H PRN  albuterol (PROVENTIL) 2.5 mg / 3 mL (0.083%) neb solution, 2.5 mg, Nebulization, Q4H PRN  alcohol 62 % (NOZIN NASAL SANITIZER) nasal solution, 1 Each, Each Nostril, 2x/day  aluminum-magnesium hydroxide-simethicone (MAG-AL PLUS) 200-200-20 mg per 5 mL oral liquid, 15 mL, Oral, 4x/day PRN  aspirin tablet 325 mg, 325 mg, Oral, Daily  buprenorphine-naloxone (SUBOXONE) 2-0.5mg  per sublingual tablet, 1 Tablet, Sublingual, 2x/day  calcitriol (ROCALTROL) capsule, 0.25 mcg, Oral, Q12H  calcium carbonate (TUMS) 500mg  (200mg  elemental calcium) chewable tablet, 500 mg, Oral, 3x/day PRN  collagenase (SANTYL) 250 unit/gm ointment, , Apply Topically, Daily  Correction/SSIP insulin lispro (HumaLOG) 100 units/mL injection, 0-18 Units, Subcutaneous,  4x/day AC  dextrose (GLUTOSE) 40% oral gel, 15 g, Oral, Q15 Min PRN  dextrose 50% (0.5 g/mL) injection - syringe, 12.5 g, Intravenous, Q15 Min PRN  diphenhydrAMINE (BENADRYL) 50 mg/mL injection, 25 mg, Intravenous, Q6H PRN  docusate sodium (COLACE) capsule, 100 mg, Oral, 2x/day  ergocalciferol-vitamin D2 (DRISDOL) capsule, 50,000 Units, Oral, M and F  famotidine (PEPCID) tablet, 20 mg, Oral, 2x/day  glucagon (GLUCAGEN DIAGNOSTIC KIT) injection 1 mg, 1 mg, IntraMUSCULAR, Once PRN  guaiFENesin 100mg  per 5mL oral liquid - for cough (expectorant), 200 mg, Oral, Q4H PRN  haloperidol (HALDOL) 5 mg/mL injection, 5 mg, Intravenous, Q6H PRN  heparin 5,000 unit/mL injection, 5,000 Units, Subcutaneous, Q12H  hydrALAZINE (APRESOLINE) injection 10 mg, 10 mg, Intravenous, Q4H PRN  LORazepam (ATIVAN) 2 mg/mL injection, 0.5 mg, Intravenous, Q4H PRN  magnesium oxide (MAG-OX) 400mg  (241.3 mg elemental magnesium) tablet, 400 mg, Oral, 2x/day  nitroGLYCERIN (NITROSTAT) sublingual tablet, 0.4 mg, Sublingual, Q5 Min PRN  ondansetron (ZOFRAN) 2 mg/mL injection, 4 mg, Intravenous, Q8H PRN  pantoprazole (PROTONIX) delayed release tablet, 40 mg, Oral, Daily  polyethylene glycol (MIRALAX) oral packet, 17 g, Oral, Daily PRN  tamsulosin (FLOMAX)  capsule, 0.4 mg, Oral, Daily after Dinner  traZODone (DESYREL) tablet, 50 mg, Oral, HS PRN  vancomycin 25 mg per mL oral liquid with cherry flavoring, 125 mg, Oral, 4x/day      Labs:  BMP:   137 (07/12 0410) 106 (07/12 0410) 42* (07/12 0410)    /     186* (07/12 0410)   3.6 (07/12 0410) 22 (07/12 0410) 3.47* (07/12 0410) \             CBC:     20.4* (07/12 0410) \   10.8* (07/12 0410) /   257 (07/12 0410)      / 34.1* (07/12 0410) \          Microbiology:  Hospital Encounter on 03/12/23 (from the past 96 hour(s))   C. DIFFICILE PCR    Collection Time: 03/18/23  4:33 PM    Specimen: Stool   Culture Result Status    C. DIFFICILE TOXIN GENE, PCR Positive (A) Final    PRESUMPTIVE 027/NAP1/BI Positive (A)  Final    Narrative    Error^5011^Post-run analysis error^Error 5011: Signal loss detected in the amplification curve for analyte [SPC]. 15.2 decrease in signal with 21.1% decrease at cycle 27.^[id number]  BMWUX^[id number]^WNUU-VOZ analysis error^Error 5011: Signal loss detected in the amplification curve for analyte [SPC]. 15.2 decrease in signal with 21.1% decrease at cycle 27.^[id number]   CLOSTRIDIUM DIFFICILE TOXIN A/B    Collection Time: 03/18/23  4:33 PM    Specimen: Stool   Culture Result Status    CLOSTRIDIUM DIFFICILE TOXIN A/B Positive (A) Final       Imaging:   ECG 12 LEAD  Normal sinus rhythm  Left axis deviation  Low voltage QRS  Inferior infarct (cited on or before 12-Mar-2023)  Prolonged QT  Abnormal ECG  When compared with ECG of 12-Mar-2023 15:09,  No significant change was found  Confirmed by Earnie Larsson ([id number]) on 03/17/2023 10:25:18 AM    Assessment/ Plan:   Active Hospital Problems   (*Primary Problem)    Diagnosis    CKD (chronic kidney disease) stage 4, GFR 15-29 ml/min (CMS HCC)       Chronic kidney disease stage 4, acute on chronic kidney disease  -obstructive uropathy/retention plus prerenal  -patient has possibly new baseline around 3.2-3.4.  -SCR 3.47* (07/12 0410) condition is stable  -keep Foley  -keep Flomax  -serology workup is negative  -no need for dialysis    Acid-base , better   Electrolytes , replace magnesium    Hypocalcemia  -continue vitamin-D and calcitriol, patient has secondary hyperparathyroidism.      MY ORDERS LAST 24 (24h ago, onward)       Start     Ordered    03/20/23 2100  magnesium oxide (MAG-OX) 400mg  (241.3 mg elemental magnesium) tablet  2 TIMES DAILY         03/20/23 1709                      [name], MD, FASN, 03/21/2023, 11:07

## 2023-03-25 NOTE — Care Management Notes (Signed)
Referral Information  ++++++ Placed Provider #1 ++++++  Case Manager: Yaakov Guthrie  Provider Type: Nursing Home/SNF-Return  Provider Name: [facility]  Address:  [facility] [place] [address] [facility]  [place], [place] [address]  Contact: Lourena Simmonds Days    Phone: [phone] x  Fax:   Fax: [phone]

## 2023-04-01 ENCOUNTER — Emergency Department (HOSPITAL_BASED_OUTPATIENT_CLINIC_OR_DEPARTMENT_OTHER): Payer: Medicare PPO

## 2023-04-01 ENCOUNTER — Encounter (HOSPITAL_BASED_OUTPATIENT_CLINIC_OR_DEPARTMENT_OTHER): Payer: Self-pay

## 2023-04-01 ENCOUNTER — Encounter (INDEPENDENT_AMBULATORY_CARE_PROVIDER_SITE_OTHER): Payer: Self-pay | Admitting: Nephrology

## 2023-04-01 ENCOUNTER — Other Ambulatory Visit: Payer: Self-pay

## 2023-04-01 ENCOUNTER — Emergency Department
Admission: EM | Admit: 2023-04-01 | Discharge: 2023-04-01 | Disposition: A | Discharge status: Other Acute Inpatient Hospital | Payer: Medicare PPO | Attending: Family | Admitting: Family

## 2023-04-01 DIAGNOSIS — N179 Acute kidney failure, unspecified: Secondary | ICD-10-CM

## 2023-04-01 DIAGNOSIS — Z8619 Personal history of other infectious and parasitic diseases: Secondary | ICD-10-CM

## 2023-04-01 DIAGNOSIS — Z8679 Personal history of other diseases of the circulatory system: Secondary | ICD-10-CM

## 2023-04-01 DIAGNOSIS — N19 Unspecified kidney failure: Secondary | ICD-10-CM

## 2023-04-01 DIAGNOSIS — R9431 Abnormal electrocardiogram [ECG] [EKG]: Secondary | ICD-10-CM | POA: Insufficient documentation

## 2023-04-01 DIAGNOSIS — L97529 Non-pressure chronic ulcer of other part of left foot with unspecified severity: Secondary | ICD-10-CM

## 2023-04-01 DIAGNOSIS — E860 Dehydration: Secondary | ICD-10-CM

## 2023-04-01 DIAGNOSIS — Z1152 Encounter for screening for COVID-19: Secondary | ICD-10-CM | POA: Insufficient documentation

## 2023-04-01 DIAGNOSIS — M7989 Other specified soft tissue disorders: Secondary | ICD-10-CM | POA: Insufficient documentation

## 2023-04-01 DIAGNOSIS — A419 Sepsis, unspecified organism: Secondary | ICD-10-CM | POA: Insufficient documentation

## 2023-04-01 DIAGNOSIS — M19011 Primary osteoarthritis, right shoulder: Secondary | ICD-10-CM | POA: Insufficient documentation

## 2023-04-01 LAB — COMPREHENSIVE METABOLIC PANEL, NON-FASTING
ALBUMIN/GLOBULIN RATIO: 0.4 — ABNORMAL LOW (ref 0.8–1.4)
ALBUMIN: 1.8 g/dL — ABNORMAL LOW (ref 3.4–5.0)
ALKALINE PHOSPHATASE: 98 U/L (ref 46–116)
ALT (SGPT): <6 U/L (ref <=78)
ANION GAP: 11 mmol/L (ref 4–13)
AST (SGOT): 11 U/L — ABNORMAL LOW (ref 15–37)
BILIRUBIN TOTAL: 0.3 mg/dL (ref 0.2–1.0)
BUN/CREA RATIO: 9
BUN: 25 mg/dL — ABNORMAL HIGH (ref 7–18)
CALCIUM, CORRECTED: 10.1 mg/dL
CALCIUM: 8.3 mg/dL — ABNORMAL LOW (ref 8.5–10.1)
CHLORIDE: 106 mmol/L (ref 98–107)
CO2 TOTAL: 23 mmol/L (ref 21–32)
CREATININE: 2.93 mg/dL — ABNORMAL HIGH (ref 0.70–1.30)
ESTIMATED GFR: 23 mL/min/{1.73_m2} — ABNORMAL LOW (ref >59)
GLOBULIN: 5.1
GLUCOSE: 156 mg/dL — ABNORMAL HIGH (ref 74–106)
OSMOLALITY, CALCULATED: 287 mOsm/kg (ref 270–290)
POTASSIUM: 4.3 mmol/L (ref 3.5–5.1)
PROTEIN TOTAL: 6.9 g/dL (ref 6.4–8.2)
SODIUM: 140 mmol/L (ref 136–145)

## 2023-04-01 LAB — COVID-19, FLU A/B, RSV RAPID BY PCR
INFLUENZA VIRUS TYPE A: NOT DETECTED
INFLUENZA VIRUS TYPE B: NOT DETECTED
RESPIRATORY SYNCTIAL VIRUS (RSV): NOT DETECTED
SARS-CoV-2: NOT DETECTED

## 2023-04-01 LAB — CBC WITH DIFF
BASOPHIL #: 0.03 10*3/uL (ref 0.00–0.30)
BASOPHIL %: 0 % (ref 0–3)
EOSINOPHIL #: 0.17 10*3/uL (ref 0.00–0.80)
EOSINOPHIL %: 1 % (ref 0–7)
HCT: 33.6 % — ABNORMAL LOW (ref 42.0–51.0)
HGB: 10.7 g/dL — ABNORMAL LOW (ref 13.5–18.0)
LYMPHOCYTE #: 1.41 10*3/uL (ref 1.10–5.00)
LYMPHOCYTE %: 11 % — ABNORMAL LOW (ref 25–45)
MCH: 27.7 pg (ref 27.0–32.0)
MCHC: 31.9 g/dL — ABNORMAL LOW (ref 32.0–36.0)
MCV: 86.9 fL (ref 78.0–99.0)
MONOCYTE #: 0.42 10*3/uL (ref 0.00–1.30)
MONOCYTE %: 3 % (ref 0–12)
MPV: 7.8 fL (ref 7.4–10.4)
NEUTROPHIL #: 11.3 10*3/uL — ABNORMAL HIGH (ref 1.80–8.40)
NEUTROPHIL %: 85 % — ABNORMAL HIGH (ref 40–76)
PLATELETS: 413 10*3/uL (ref 140–440)
RBC: 3.87 10*6/uL — ABNORMAL LOW (ref 4.20–6.00)
RDW: 17.7 % — ABNORMAL HIGH (ref 11.6–14.8)
WBC: 13.3 10*3/uL — ABNORMAL HIGH (ref 4.0–10.5)

## 2023-04-01 LAB — TROPONIN-I
TROPONIN I: 4 ng/L (ref <20)
TROPONIN I: 5 ng/L (ref <20)

## 2023-04-01 LAB — URINALYSIS, MICROSCOPIC: RBCS: >50 /hpf — AB

## 2023-04-01 LAB — URINALYSIS, MACRO/MICRO
BILIRUBIN: NEGATIVE mg/dL
GLUCOSE: 250 mg/dL — AB
KETONES: NEGATIVE mg/dL
NITRITE: NEGATIVE
PH: 6.5 (ref 4.6–8.0)
PROTEIN: >=300 mg/dL — AB
SPECIFIC GRAVITY: 1.02 (ref 1.003–1.035)
UROBILINOGEN: 0.2 mg/dL (ref 0.2–1.0)

## 2023-04-01 LAB — LACTIC ACID LEVEL W/ REFLEX FOR LEVEL >2.0: LACTIC ACID: 1.1 mmol/L (ref 0.4–2.0)

## 2023-04-01 MED ORDER — SODIUM CHLORIDE 0.9 % IV BOLUS
1000.0000 mL | INJECTION | Status: AC
Start: 2023-04-01 — End: 2023-04-01
  Administered 2023-04-01: 0 mL via INTRAVENOUS
  Administered 2023-04-01: 1000 mL via INTRAVENOUS

## 2023-04-01 MED ORDER — SODIUM CHLORIDE 0.9 % INTRAVENOUS PIGGYBACK
100.0000 mg | INTRAVENOUS | Status: AC
Start: 2023-04-01 — End: 2023-04-01
  Administered 2023-04-01: 100 mg via INTRAVENOUS
  Administered 2023-04-01: 0 mg via INTRAVENOUS

## 2023-04-01 MED ORDER — BUPRENORPHINE 8 MG-NALOXONE 2 MG SUBLINGUAL TABLET
SUBLINGUAL_TABLET | SUBLINGUAL | Status: AC
Start: 2023-04-01 — End: 2023-04-01
  Filled 2023-04-01: qty 1

## 2023-04-01 MED ORDER — SODIUM CHLORIDE 0.9 % INTRAVENOUS PIGGYBACK
1.0000 g | Freq: Two times a day (BID) | INTRAVENOUS | Status: DC
Start: 2023-04-02 — End: 2023-04-01
  Administered 2023-04-01: 0 g via INTRAVENOUS

## 2023-04-01 MED ORDER — DOXYCYCLINE HYCLATE 100 MG INTRAVENOUS POWDER FOR SOLUTION
INTRAVENOUS | Status: AC
Start: 2023-04-01 — End: 2023-04-01
  Filled 2023-04-01: qty 10

## 2023-04-01 MED ORDER — SODIUM CHLORIDE 0.45 % INTRAVENOUS SOLUTION
INTRAVENOUS | Status: DC
Start: 2023-04-01 — End: 2023-04-01

## 2023-04-01 MED ORDER — SODIUM CHLORIDE 0.9 % (FLUSH) INJECTION SYRINGE
3.0000 mL | INJECTION | Freq: Three times a day (TID) | INTRAMUSCULAR | Status: DC
Start: 2023-04-01 — End: 2023-04-01

## 2023-04-01 MED ORDER — SODIUM CHLORIDE 0.9 % INTRAVENOUS PIGGYBACK
1.0000 g | Freq: Two times a day (BID) | INTRAVENOUS | Status: DC
Start: 2023-04-02 — End: 2023-04-01

## 2023-04-01 MED ORDER — SODIUM CHLORIDE 0.9 % INTRAVENOUS PIGGYBACK
1.0000 g | INTRAVENOUS | Status: DC
Start: 2023-04-01 — End: 2023-04-01

## 2023-04-01 MED ORDER — VANCOMYCIN 1,000 MG INTRAVENOUS INJECTION
15.0000 mg/kg | INTRAVENOUS | Status: AC
Start: 2023-04-01 — End: 2023-04-01
  Administered 2023-04-01: 1250 mg via INTRAVENOUS

## 2023-04-01 MED ORDER — SODIUM CHLORIDE 0.9 % INTRAVENOUS PIGGYBACK
INJECTION | INTRAVENOUS | Status: AC
Start: 2023-04-01 — End: 2023-04-01
  Filled 2023-04-01: qty 100

## 2023-04-01 MED ORDER — CEFEPIME 1 GRAM SOLUTION FOR INJECTION
INTRAMUSCULAR | Status: AC
Start: 2023-04-01 — End: 2023-04-01
  Filled 2023-04-01: qty 10

## 2023-04-01 MED ORDER — SODIUM CHLORIDE 0.9 % (FLUSH) INJECTION SYRINGE
3.0000 mL | INJECTION | INTRAMUSCULAR | Status: DC | PRN
Start: 2023-04-01 — End: 2023-04-01

## 2023-04-01 MED ORDER — BUPRENORPHINE 8 MG-NALOXONE 2 MG SUBLINGUAL TABLET
0.2500 | SUBLINGUAL_TABLET | Freq: Every day | SUBLINGUAL | Status: DC
Start: 2023-04-01 — End: 2023-04-01
  Administered 2023-04-01: 0.25 via SUBLINGUAL

## 2023-04-01 MED ORDER — VANCOMYCIN 1,000 MG INTRAVENOUS INJECTION
INTRAVENOUS | Status: AC
Start: 2023-04-01 — End: 2023-04-01
  Filled 2023-04-01: qty 20

## 2023-04-01 NOTE — ED Provider Notes (Signed)
[facility], [facility] Emergency Department  ED Primary Provider Note  History of Present Illness   Chief Complaint   Patient presents with    Abnormal Lab Result     Arrival: The patient arrived by Ambulance  [name] [name] is a 62 y.o. male who had concerns including Abnormal Lab Result. Pt states he has infection in his foot, blood and his kidneys are failing. Recently had labs at [facility] . Worsening renal failure and higher wbc.   Review of Systems   Constitutional: No fever, chills + weakness   Skin: No rash or diaphoresis + wound infection   HENT: No headaches, or congestion  Eyes: No vision changes or photophobia   Cardio: No chest pain, palpitations or leg swelling   Respiratory: No cough, wheezing or SOB  GI:  No nausea, vomiting or stool changes  GU:  No dysuria, hematuria, or increased frequency  MSK: No muscle aches, joint or back pain  Neuro: No seizures, LOC, numbness, tingling, or focal weakness  Psychiatric: No depression, SI or substance abuse  All other systems reviewed and are negative.    History Reviewed This Encounter: all noted and reviewed    Physical Exam   ED Triage Vitals [04/01/23 1243]   BP (Non-Invasive) 139/82   Heart Rate 98   Respiratory Rate 18   Temperature 37.7 C (99.9 F)   SpO2 100 %   Weight 99.8 kg (220 lb)   Height 1.803 m (5\' 11" )       Constitutional:  63 y.o. male who appears in no distress. Normal color, no cyanosis.   HENT:   Head: Normocephalic and atraumatic.   Mouth/Throat: Oropharynx is clear and dry.    Eyes: EOMI, PERRL   Neck: Trachea midline. Neck supple.  Cardiovascular: RRR, No murmurs, rubs or gallops. Intact distal pulses.  Pulmonary/Chest: BS equal bilaterally. No respiratory distress. No wheezes, rales or chest tenderness.   Abdominal: Bowel sounds present and normal. Abdomen soft, no tenderness, no rebound and no guarding.  Back: No midline spinal tenderness, no paraspinal tenderness, no CVA tenderness.            Musculoskeletal: + left heel edema, ulceration  no tenderness or deformity.  Skin: warm and dry. No rash, erythema, pallor or cyanosis  Psychiatric: normal mood and affect. Behavior is normal.   Neurological: Patient keenly alert and responsive, easily able to raise eyebrows, facial muscles/expressions symmetric, speaking in fluent sentences, moving all extremities equally and fully, normal gait  Patient Data     Labs Ordered/Reviewed   COMPREHENSIVE METABOLIC PANEL, NON-FASTING - Abnormal; Notable for the following components:       Result Value    BUN 25 (*)     CREATININE 2.93 (*)     ESTIMATED GFR 23 (*)     ALBUMIN 1.8 (*)     CALCIUM 8.3 (*)     GLUCOSE 156 (*)     AST (SGOT) 11 (*)     ALBUMIN/GLOBULIN RATIO 0.4 (*)     All other components within normal limits    Narrative:     Above range, check for dilution.  Estimated Glomerular Filtration Rate (eGFR) is calculated using the CKD-EPI (2021) equation, intended for patients 30 years of age and older. If gender is not documented or "unknown", there will be no eGFR calculation.   CBC WITH DIFF - Abnormal; Notable for the following components:    WBC 13.3 (*)     RBC 3.87 (*)  HGB 10.7 (*)     HCT 33.6 (*)     MCHC 31.9 (*)     RDW 17.7 (*)     NEUTROPHIL % 85 (*)     LYMPHOCYTE % 11 (*)     NEUTROPHIL # 11.30 (*)     All other components within normal limits   URINALYSIS, MACRO/MICRO - Abnormal; Notable for the following components:    APPEARANCE Slightly Hazy (*)     LEUKOCYTES Trace (*)     PROTEIN >= 300 (*)     GLUCOSE 250 (*)     BLOOD Moderate (*)     All other components within normal limits   URINALYSIS, MICROSCOPIC - Abnormal; Notable for the following components:    RBCS >50 (*)     BACTERIA Few (*)     MUCOUS Few (*)     WBCS 20-50 (*)     All other components within normal limits   LACTIC ACID LEVEL W/ REFLEX FOR LEVEL >2.0 - Normal   COVID-19, FLU A/B, RSV RAPID BY PCR - Normal    Narrative:     Results are for the simultaneous qualitative  identification of SARS-CoV-2 (formerly 2019-nCoV), Influenza A, Influenza B, and RSV RNA. These etiologic agents are generally detectable in nasopharyngeal and nasal swabs during the ACUTE PHASE of infection. Hence, this test is intended to be performed on respiratory specimens collected from individuals with signs and symptoms of upper respiratory tract infection who meet Centers for Disease Control and Prevention (CDC) clinical and/or epidemiological criteria for Coronavirus Disease 2019 (COVID-19) testing. CDC COVID-19 criteria for testing on human specimens is available at [facility] webpage information for Healthcare Professionals: Coronavirus Disease 2019 (COVID-19) ([email or web address]).     False-negative results may occur if the virus has genomic mutations, insertions, deletions, or rearrangements or if performed very early in the course of illness. Otherwise, negative results indicate virus specific RNA targets are not detected, however negative results do not preclude SARS-CoV-2 infection/COVID-19, Influenza, or Respiratory syncytial virus infection. Results should not be used as the sole basis for patient management decisions. Negative results must be combined with clinical observations, patient history, and epidemiological information. If upper respiratory tract infection is still suspected based on exposure history together with other clinical findings, re-testing should be considered.    Disclaimer:   This assay has been authorized by FDA under an Emergency Use Authorization for use in laboratories certified under the Clinical Laboratory Improvement Amendments of 1988 (CLIA), 42 U.S.C. [phone], to perform high complexity tests. The impacts of vaccines, antiviral therapeutics, antibiotics, chemotherapeutic or immunosuppressant drugs have not been evaluated.     Test methodology:   Cepheid Xpert Xpress SARS-CoV-2/Flu/RSV Assay real-time polymerase chain reaction (RT-PCR)  test on the GeneXpert Dx and Xpert Xpress systems.   TROPONIN-I - Normal    Narrative:     Values received on females ranging between 12-15 ng/L MUST include the next serial troponin to review changes in the delta differences as the reference range for the Access II chemistry analyzer is lower than the established reference range.     TROPONIN-I - Normal    Narrative:     Values received on females ranging between 12-15 ng/L MUST include the next serial troponin to review changes in the delta differences as the reference range for the Access II chemistry analyzer is lower than the established reference range.     ADULT ROUTINE BLOOD CULTURE, SET OF 2 BOTTLES (BACTERIA AND YEAST)   ADULT ROUTINE  BLOOD CULTURE, SET OF 2 BOTTLES (BACTERIA AND YEAST)   URINE CULTURE,ROUTINE   CBC/DIFF    Narrative:     The following orders were created for panel order CBC/DIFF.  Procedure                               Abnormality         Status                     ---------                               -----------         ------                     CBC WITH ZOXW[[id number]]                Abnormal            Final result                 Please view results for these tests on the individual orders.   URINALYSIS WITH REFLEX MICROSCOPIC AND CULTURE IF POSITIVE    Narrative:     The following orders were created for panel order URINALYSIS WITH REFLEX MICROSCOPIC AND CULTURE IF POSITIVE.  Procedure                               Abnormality         Status                     ---------                               -----------         ------                     URINALYSIS, MACRO/MICRO[[id number]]      Abnormal            Final result               URINALYSIS, MICROSCOPIC[[id number]]      Abnormal            Final result                 Please view results for these tests on the individual orders.   TROPONIN-I     XR CHEST AP   Final Result by Edi, Radresults In (07/23 1316)   NO ACUTE FINDINGS.            Radiologist location ID: [id number]          XR SHOULDER RIGHT   Final Result by Edi, Radresults In (07/23 1315)   DEGENERATIVE OSTEOARTHROSIS. NO ACUTE FINDINGS.                Radiologist location ID: [id number]           Medical Decision Making   Diff dx of sepsis. Osteomyelitis. , AKI. Dehydration.   Son voices concern pt could have endocarditis.  No echo since 08-1999 no blood culture in sys since 08-1999. Pt admitted 7/3 ams  sepsis. 7/9 cdfiff pos treated po Vanc. 7/12 wbc was  20, 7/20 was 22 wbc worsening renal failure bun 37, creatinine 3.99. discussed with dr. Deatra Robinson pt may need infec. Dis, mri of foot concerned it is source of infection. [facility] has no beds no id. [facility] attempt to transfer to [facility]. 1450 contacted [facility], wt list. [address] [facility] [place] [address]. Scarlette Ar accepted pt and has the approp services,   ED Course as of 04/01/23 1515   Tue Apr 01, 2023   1421 BUN(!): 25   1421 CREATININE(!): 2.93   1422 WBC(!): 13.3   1422 RBC(!): 3.87   1422 HGB(!): 10.7   1422 HCT(!): 33.6   1422 BLOOD(!): Moderate   1422 LEUKOCYTES(!): Trace   1422 APPEARANCE(!): Slightly Hazy   1422 LACTIC ACID: 1.1   1422 TROPONIN-I: 4            Medications Administered in the ED   NS flush syringe (has no administration in time range)   NS flush syringe (has no administration in time range)   vancomycin (VANCOCIN) 1,250 mg in NS 250 mL IVPB (has no administration in time range)   1/2 NS premix infusion (has no administration in time range)   cefepime (MAXIPIME) 1 g in NS 100 mL IVPB minibag (has no administration in time range)   cefepime (MAXIPIME) 1 g in NS 100 mL IVPB minibag (has no administration in time range)   doxycycline hyclate 100 mg in NS 100 mL IVPB minibag (100 mg Intravenous New Bag/New Syringe 04/01/23 1427)   buprenorphine-naloxone (SUBOXONE) 2-0.5mg  per sublingual tablet (has no administration in time range)   NS bolus infusion 1,000 mL (0 mL Intravenous Stopped 04/01/23 1414)     Clinical Impression   Sepsis, due to unspecified organism, unspecified whether acute organ  dysfunction present (CMS HCC) (Primary)   Renal failure   Ulcer of left foot, unspecified ulcer stage (CMS HCC)   History of endocarditis   History of Clostridioides difficile infection   Dehydration       Disposition: Transfered to Another Facility

## 2023-04-01 NOTE — ED Notes (Signed)
[facility] contacted to transport patient to [facility] ED at this time

## 2023-04-01 NOTE — ED Nurses Note (Signed)
Report called to [facility] @ [facility].  Patient transported via [facility].  Vancomycin continues to infuse during transport.

## 2023-04-01 NOTE — ED Nurses Note (Signed)
Dressing applied to left heel.

## 2023-04-01 NOTE — ED Nurses Note (Signed)
Patient's son notified that patient is being transferred to [facility]

## 2023-04-01 NOTE — ED Nurses Note (Signed)
[facility], [facility] Emergency Department  Peer Recovery Coach Assessment    Initial Evaluation  Referred by:: Nurse  Location of Evaluation: Emergency Department  How many times in the last 12 months have you been to the ED?: 2  Have you ever served or are you currently serving in the Armed Forces?: No             Substance Use History  Patient current substance use status: Patient stated he has went through multiple surgeries and was prescribed oxy's, 3 a day. Patient stated heis son got him into a pain clinic and they switched him over to suboxone.    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)  Any help needed with the following?: None  Contact phone number for the patient: [phone]       Has the patient had any legal issues within the past 30 days?: None         Employment  Current employment status: Disabled    Diplomatic Services operational officer?: No  Needs assistance with job search?: No      Plan  Was the patient referred to treatment?: Yes  What level of treatment was the patient referred to?: Outpatient Services  Program name: [facility]    Was patient referred to physician for Buprenorphine Assessment in the ED?: No    Did patient receive Narcan in the ED?: No         Follow-up  Patient admitted for treatment?: Yes  Treatment center patient admitted to:: [facility]  Date of intake appointment: 04/01/23  Did patient attend intake appointment?: Yes  Need for additional follow-up?: No       Haynes Bast, Peer Recovery Coach 04/01/2023  13:42

## 2023-04-01 NOTE — ED Triage Notes (Signed)
Pt sent from [facility] with complaints of abnormal labs but staff did tell them he needs a PICC line.

## 2023-04-01 NOTE — ED Nurses Note (Signed)
Spoke with pharmacy about rate for cefepime and was informed to infuse over 15-30 min. Instead of 3 hours.  Pharmacy states they will change the order.  Rate changed to 200 ml/hr @ this time.

## 2023-04-02 DIAGNOSIS — I10 Essential (primary) hypertension: Secondary | ICD-10-CM

## 2023-04-02 DIAGNOSIS — I252 Old myocardial infarction: Secondary | ICD-10-CM

## 2023-04-02 DIAGNOSIS — R9431 Abnormal electrocardiogram [ECG] [EKG]: Secondary | ICD-10-CM

## 2023-04-02 LAB — ECG 12 LEAD
Atrial Rate: 75 {beats}/min
Calculated P Axis: 47 degrees
Calculated R Axis: -39 degrees
Calculated T Axis: 9 degrees
PR Interval: 172 ms
QRS Duration: 90 ms
QT Interval: 420 ms
QTC Calculation: 469 ms
Ventricular rate: 75 {beats}/min

## 2023-04-03 LAB — URINE CULTURE,ROUTINE: URINE CULTURE: NO GROWTH

## 2023-04-06 LAB — ADULT ROUTINE BLOOD CULTURE, SET OF 2 BOTTLES (BACTERIA AND YEAST)
BLOOD CULTURE, ROUTINE: NO GROWTH
BLOOD CULTURE, ROUTINE: NO GROWTH

## 2023-04-24 ENCOUNTER — Encounter (INDEPENDENT_AMBULATORY_CARE_PROVIDER_SITE_OTHER): Payer: Self-pay | Admitting: Nephrology

## 2023-05-16 ENCOUNTER — Other Ambulatory Visit (INDEPENDENT_AMBULATORY_CARE_PROVIDER_SITE_OTHER): Payer: Medicare Other

## 2023-05-16 ENCOUNTER — Other Ambulatory Visit: Payer: Self-pay

## 2023-05-19 ENCOUNTER — Ambulatory Visit (HOSPITAL_BASED_OUTPATIENT_CLINIC_OR_DEPARTMENT_OTHER): Payer: Medicare Other | Admitting: ORTHOPEDIC, SPORTS MEDICINE

## 2024-10-10 DEATH — deceased
